# Patient Record
Sex: Female | Born: 1945 | Race: White | Hispanic: No | Marital: Married | Smoking: Former smoker
Health system: Southern US, Community
[De-identification: ages and names within clinical notes are randomized; demographics above are authoritative.]

## PROBLEM LIST (undated history)

## (undated) DIAGNOSIS — M0520 Rheumatoid vasculitis with rheumatoid arthritis of unspecified site: Secondary | ICD-10-CM

## (undated) DIAGNOSIS — S060XAA Concussion with loss of consciousness status unknown, initial encounter: Secondary | ICD-10-CM

## (undated) DIAGNOSIS — H269 Unspecified cataract: Secondary | ICD-10-CM

## (undated) DIAGNOSIS — E039 Hypothyroidism, unspecified: Secondary | ICD-10-CM

## (undated) DIAGNOSIS — T7840XA Allergy, unspecified, initial encounter: Secondary | ICD-10-CM

## (undated) DIAGNOSIS — F329 Major depressive disorder, single episode, unspecified: Secondary | ICD-10-CM

## (undated) DIAGNOSIS — M6530 Trigger finger, unspecified finger: Secondary | ICD-10-CM

## (undated) DIAGNOSIS — E559 Vitamin D deficiency, unspecified: Secondary | ICD-10-CM

## (undated) DIAGNOSIS — I10 Essential (primary) hypertension: Secondary | ICD-10-CM

## (undated) DIAGNOSIS — E785 Hyperlipidemia, unspecified: Secondary | ICD-10-CM

## (undated) DIAGNOSIS — M810 Age-related osteoporosis without current pathological fracture: Secondary | ICD-10-CM

## (undated) DIAGNOSIS — F32A Depression, unspecified: Secondary | ICD-10-CM

## (undated) DIAGNOSIS — M797 Fibromyalgia: Secondary | ICD-10-CM

## (undated) DIAGNOSIS — S060X9A Concussion with loss of consciousness of unspecified duration, initial encounter: Secondary | ICD-10-CM

## (undated) HISTORY — DX: Concussion with loss of consciousness of unspecified duration, initial encounter: S06.0X9A

## (undated) HISTORY — DX: Fibromyalgia: M79.7

## (undated) HISTORY — DX: Vitamin D deficiency, unspecified: E55.9

## (undated) HISTORY — DX: Hyperlipidemia, unspecified: E78.5

## (undated) HISTORY — DX: Allergy, unspecified, initial encounter: T78.40XA

## (undated) HISTORY — DX: Concussion with loss of consciousness status unknown, initial encounter: S06.0XAA

## (undated) HISTORY — PX: OTHER SURGICAL HISTORY: SHX169

## (undated) HISTORY — DX: Hypothyroidism, unspecified: E03.9

## (undated) HISTORY — DX: Rheumatoid vasculitis with rheumatoid arthritis of unspecified site: M05.20

## (undated) HISTORY — DX: Essential (primary) hypertension: I10

## (undated) HISTORY — PX: CATARACT EXTRACTION, BILATERAL: SHX1313

## (undated) HISTORY — DX: Major depressive disorder, single episode, unspecified: F32.9

## (undated) HISTORY — DX: Age-related osteoporosis without current pathological fracture: M81.0

## (undated) HISTORY — DX: Depression, unspecified: F32.A

## (undated) HISTORY — DX: Unspecified cataract: H26.9

## (undated) HISTORY — PX: CHOLECYSTECTOMY: SHX55

## (undated) HISTORY — PX: CYST REMOVAL TRUNK: SHX6283

## (undated) HISTORY — DX: Trigger finger, unspecified finger: M65.30

---

## 2004-05-06 ENCOUNTER — Ambulatory Visit: Payer: Self-pay | Admitting: Internal Medicine

## 2004-07-21 ENCOUNTER — Ambulatory Visit: Payer: Self-pay

## 2005-06-28 ENCOUNTER — Ambulatory Visit: Payer: Self-pay

## 2005-07-03 ENCOUNTER — Inpatient Hospital Stay: Payer: Self-pay | Admitting: General Surgery

## 2007-10-11 DIAGNOSIS — M6530 Trigger finger, unspecified finger: Secondary | ICD-10-CM | POA: Insufficient documentation

## 2013-11-13 LAB — HM PAP SMEAR: HM Pap smear: NORMAL

## 2013-11-23 LAB — HM DEXA SCAN: HM DEXA SCAN: ABNORMAL

## 2013-11-23 LAB — HM COLONOSCOPY: HM Colonoscopy: NORMAL

## 2014-02-16 LAB — LIPID PANEL
CHOLESTEROL: 218 mg/dL — AB (ref 0–200)
HDL: 35 mg/dL (ref 35–70)
LDL CALC: 140 mg/dL
TRIGLYCERIDES: 217 mg/dL — AB (ref 40–160)

## 2014-02-16 LAB — HEMOGLOBIN A1C: HEMOGLOBIN A1C: 5.3 % (ref 4.0–6.0)

## 2014-11-24 ENCOUNTER — Other Ambulatory Visit: Payer: Self-pay | Admitting: Family Medicine

## 2014-11-24 DIAGNOSIS — I10 Essential (primary) hypertension: Secondary | ICD-10-CM

## 2014-11-24 MED ORDER — HYDROCHLOROTHIAZIDE 12.5 MG PO TABS: 12.5000 mg | ORAL_TABLET | Freq: Every day | ORAL | Status: DC

## 2014-12-08 LAB — HM MAMMOGRAPHY: HM Mammogram: NEGATIVE

## 2014-12-09 ENCOUNTER — Encounter: Payer: Self-pay | Admitting: Family Medicine

## 2015-01-03 ENCOUNTER — Other Ambulatory Visit: Payer: Self-pay

## 2015-01-03 DIAGNOSIS — I10 Essential (primary) hypertension: Secondary | ICD-10-CM

## 2015-01-03 MED ORDER — METFORMIN HCL 500 MG PO TABS: 500.0000 mg | ORAL_TABLET | Freq: Two times a day (BID) | ORAL | Status: DC

## 2015-01-03 MED ORDER — HYDROCHLOROTHIAZIDE 12.5 MG PO TABS: 12.5000 mg | ORAL_TABLET | Freq: Every day | ORAL | Status: DC

## 2015-01-06 ENCOUNTER — Other Ambulatory Visit: Payer: Self-pay

## 2015-01-06 DIAGNOSIS — I10 Essential (primary) hypertension: Secondary | ICD-10-CM

## 2015-01-06 MED ORDER — HYDROCHLOROTHIAZIDE 12.5 MG PO TABS: 12.5000 mg | ORAL_TABLET | Freq: Every day | ORAL | Status: DC

## 2015-01-06 MED ORDER — METFORMIN HCL 500 MG PO TABS: 500.0000 mg | ORAL_TABLET | Freq: Two times a day (BID) | ORAL | Status: DC

## 2015-04-13 ENCOUNTER — Other Ambulatory Visit: Payer: Self-pay

## 2015-04-13 DIAGNOSIS — I10 Essential (primary) hypertension: Secondary | ICD-10-CM

## 2015-04-13 MED ORDER — HYDROCHLOROTHIAZIDE 12.5 MG PO TABS: 12.5000 mg | ORAL_TABLET | Freq: Every day | ORAL | Status: DC

## 2015-04-13 NOTE — Telephone Encounter (Signed)
Patient requesting refill. 

## 2015-05-09 ENCOUNTER — Telehealth: Payer: Self-pay | Admitting: Family Medicine

## 2015-05-09 NOTE — Telephone Encounter (Signed)
Duloxetine: patient is completely out of Duloxetine. She does the mail order however it will not be in until Thursday. She is requesting that you send in enough to last until then. Please send to her local pharmacy [facility]

## 2015-05-26 ENCOUNTER — Ambulatory Visit (INDEPENDENT_AMBULATORY_CARE_PROVIDER_SITE_OTHER): Payer: Medicare Other | Admitting: Family Medicine

## 2015-05-26 ENCOUNTER — Other Ambulatory Visit: Payer: Self-pay | Admitting: Family Medicine

## 2015-05-26 ENCOUNTER — Telehealth: Payer: Self-pay

## 2015-05-26 ENCOUNTER — Encounter: Payer: Self-pay | Admitting: Family Medicine

## 2015-05-26 VITALS — BP 148/90 | HR 100 | Temp 98.7°F | Resp 16 | Ht 60.0 in | Wt 145.4 lb

## 2015-05-26 DIAGNOSIS — Z85820 Personal history of malignant melanoma of skin: Secondary | ICD-10-CM | POA: Diagnosis not present

## 2015-05-26 DIAGNOSIS — E785 Hyperlipidemia, unspecified: Secondary | ICD-10-CM | POA: Insufficient documentation

## 2015-05-26 DIAGNOSIS — E119 Type 2 diabetes mellitus without complications: Secondary | ICD-10-CM | POA: Diagnosis not present

## 2015-05-26 DIAGNOSIS — E559 Vitamin D deficiency, unspecified: Secondary | ICD-10-CM | POA: Diagnosis not present

## 2015-05-26 DIAGNOSIS — H9190 Unspecified hearing loss, unspecified ear: Secondary | ICD-10-CM

## 2015-05-26 DIAGNOSIS — F325 Major depressive disorder, single episode, in full remission: Secondary | ICD-10-CM | POA: Insufficient documentation

## 2015-05-26 DIAGNOSIS — M05731 Rheumatoid arthritis with rheumatoid factor of right wrist without organ or systems involvement: Secondary | ICD-10-CM

## 2015-05-26 DIAGNOSIS — E038 Other specified hypothyroidism: Secondary | ICD-10-CM

## 2015-05-26 DIAGNOSIS — E039 Hypothyroidism, unspecified: Secondary | ICD-10-CM | POA: Insufficient documentation

## 2015-05-26 DIAGNOSIS — Z79899 Other long term (current) drug therapy: Secondary | ICD-10-CM

## 2015-05-26 DIAGNOSIS — M05732 Rheumatoid arthritis with rheumatoid factor of left wrist without organ or systems involvement: Secondary | ICD-10-CM

## 2015-05-26 DIAGNOSIS — Z23 Encounter for immunization: Secondary | ICD-10-CM | POA: Diagnosis not present

## 2015-05-26 DIAGNOSIS — M810 Age-related osteoporosis without current pathological fracture: Secondary | ICD-10-CM

## 2015-05-26 DIAGNOSIS — M069 Rheumatoid arthritis, unspecified: Secondary | ICD-10-CM | POA: Insufficient documentation

## 2015-05-26 DIAGNOSIS — F330 Major depressive disorder, recurrent, mild: Secondary | ICD-10-CM | POA: Diagnosis not present

## 2015-05-26 DIAGNOSIS — E1129 Type 2 diabetes mellitus with other diabetic kidney complication: Secondary | ICD-10-CM

## 2015-05-26 DIAGNOSIS — R809 Proteinuria, unspecified: Secondary | ICD-10-CM

## 2015-05-26 DIAGNOSIS — Z720 Tobacco use: Secondary | ICD-10-CM | POA: Insufficient documentation

## 2015-05-26 DIAGNOSIS — Z780 Asymptomatic menopausal state: Secondary | ICD-10-CM | POA: Insufficient documentation

## 2015-05-26 DIAGNOSIS — M797 Fibromyalgia: Secondary | ICD-10-CM | POA: Insufficient documentation

## 2015-05-26 DIAGNOSIS — I10 Essential (primary) hypertension: Secondary | ICD-10-CM

## 2015-05-26 LAB — POCT UA - MICROALBUMIN: MICROALBUMIN (UR) POC: 50 mg/L

## 2015-05-26 LAB — POCT GLYCOSYLATED HEMOGLOBIN (HGB A1C): Hemoglobin A1C: 5.4

## 2015-05-26 MED ORDER — DULOXETINE HCL 60 MG PO CPEP: 60.0000 mg | ORAL_CAPSULE | Freq: Every day | ORAL | Status: DC

## 2015-05-26 MED ORDER — MELOXICAM 15 MG PO TABS: 15.0000 mg | ORAL_TABLET | Freq: Every day | ORAL | Status: DC

## 2015-05-26 MED ORDER — LEVOTHYROXINE SODIUM 50 MCG PO TABS: 50.0000 ug | ORAL_TABLET | Freq: Every day | ORAL | Status: DC

## 2015-05-26 MED ORDER — HYDROCHLOROTHIAZIDE 12.5 MG PO TABS: 12.5000 mg | ORAL_TABLET | Freq: Every day | ORAL | Status: DC

## 2015-05-26 MED ORDER — METFORMIN HCL 500 MG PO TABS: 500.0000 mg | ORAL_TABLET | Freq: Every day | ORAL | Status: DC

## 2015-05-26 NOTE — Progress Notes (Signed)
Name: Renee Cowan   MRN: [id number]    DOB: 12-19-1945   Date:05/26/2015       Progress Note  Subjective  Chief Complaint  Chief Complaint  Patient presents with  . Follow-up    patient is here for her 55-month follow-up  . Medication Refill  . Hearing Problem    patient stated that she has been having some issues with her hearing. questions if it is due to her age.    HPI   DMII: diagnosed years ago, on Metformin 500 mg daily also follows a diet and is physically active.  She does not check fsbs at home. She denies polyphagia, or polyuria. She has polydipsia but states because her medication makes her mouth dry  Hearing Problem: she has noticed that she needs to use closed captions to watch TV. She would like to be tested today  Osteoporosis: she was diagnosed last year, not on medication, taking calcium otc only and would like to rechecked before going on medication  Hypertension: she is only taking HCTZ occasionally, a few times weekly . Denies chest pain or palpitation.   Hypothyroidism: she is on Synthroid 50 mcg daily, no side effects, takes it fasting with no other medication. Marland Kitchen Her weight is stable. Fatigue is intermittent  Major Depression: she has a long history  of depression, worse when husband passed away in an early age from Alzheimer's - died in September 30, 2010.  She states has intermittent of fatigue, anhedonia, crying spells. Taking medication daily .  Inflammatory arthritis: she continues to see Rheumatologist Dr. Trudie Reed in [place], on Methotrexate . Denies joint pain or swelling.    Patient Active Problem List   Diagnosis Date Noted  . Benign essential HTN 05/26/2015  . Depression, major, recurrent, mild ([place]) 05/26/2015  . Dyslipidemia 05/26/2015  . Diabetes type 2, controlled ([place]) 05/26/2015  . Fibromyalgia syndrome 05/26/2015  . Personal history of malignant melanoma of skin 05/26/2015  . Adult hypothyroidism 05/26/2015  . Osteoporosis, post-menopausal  05/26/2015  . Tobacco use 05/26/2015  . Vitamin D deficiency 05/26/2015  . Rheumatoid arthritis ([place]) 05/26/2015  . Acquired trigger finger 10/11/2007    Past Surgical History  Procedure Laterality Date  . Trigger thumb release    . Cyst removal trunk    . Cholecystectomy    . Melaoma excesion      Family History  Problem Relation Age of Onset  . Cancer Mother     cervical  . Tuberculosis Father     Social History   Social History  . Marital Status: Married    Spouse Name: N/A  . Number of Children: N/A  . Years of Education: N/A   Occupational History  . Not on file.   Social History Main Topics  . Smoking status: Current Every Day Smoker  . Smokeless tobacco: Not on file  . Alcohol Use: 0.0 oz/week    0 Standard drinks or equivalent per week  . Drug Use: No  . Sexual Activity: No   Other Topics Concern  . Not on file   Social History Narrative  . No narrative on file     Current outpatient prescriptions:  .  aspirin (ECOTRIN LOW STRENGTH) 81 MG EC tablet, ECOTRIN LOW STRENGTH, 81MG  (Oral Tablet Delayed Release)  1 po qday for 0 days  Quantity: 30.00;  Refills: 0   Ordered :17-Aug-2010  Steele Sizer MD;  Buddy Duty 20-Sep-2008 Active, Disp: , Rfl:  .  DULoxetine (CYMBALTA) 60 MG capsule, Take 1  capsule (60 mg total) by mouth daily., Disp: 90 capsule, Rfl: 1 .  folic acid (FOLVITE) 1 MG tablet, Take 1 mg by mouth daily., Disp: , Rfl: 1 .  hydrochlorothiazide (HYDRODIURIL) 12.5 MG tablet, Take 1 tablet (12.5 mg total) by mouth daily., Disp: 90 tablet, Rfl: 0 .  levothyroxine (SYNTHROID, LEVOTHROID) 50 MCG tablet, Take 1 tablet (50 mcg total) by mouth daily before breakfast., Disp: 90 tablet, Rfl: 1 .  meloxicam (MOBIC) 15 MG tablet, Take 1 tablet (15 mg total) by mouth daily., Disp: 90 tablet, Rfl: 1 .  metFORMIN (GLUCOPHAGE) 500 MG tablet, Take 1 tablet (500 mg total) by mouth daily with breakfast., Disp: 90 tablet, Rfl: 1 .  methotrexate (RHEUMATREX) 2.5 MG  tablet, , Disp: , Rfl: 0 .  Calcium Carb-Cholecalciferol (CALCIUM + D3) 600-200 MG-UNIT TABS, Take by mouth., Disp: , Rfl:   Allergies  Allergen Reactions  . Penicillins Rash  . Sulfa Antibiotics Rash     ROS  Constitutional: Negative for fever or weight change.  Respiratory: Negative for cough and shortness of breath.   Cardiovascular: Negative for chest pain or palpitations.  Gastrointestinal: Negative for abdominal pain, no bowel changes.  Musculoskeletal: Negative for gait problem no joint problems since started on Methotrexate   Skin: Negative for rash.  Neurological: Negative for dizziness or headache.  No other specific complaints in a complete review of systems (except as listed in HPI above).  Objective  Filed Vitals:   05/26/15 0805  BP: 148/90  Pulse: 100  Temp: 98.7 F (37.1 C)  TempSrc: Oral  Resp: 16  Height: 5' (1.524 m)  Weight: 145 lb 6.4 oz (65.953 kg)  SpO2: 97%    Body mass index is 28.4 kg/(m^2).  Physical Exam  Constitutional: Patient appears well-developed and well-nourished. No distress.  HEENT: head atraumatic, normocephalic, pupils equal and reactive to light, ears normal TM, neck supple, throat within normal limits Cardiovascular: Normal rate, regular rhythm and normal heart sounds.  No murmur heard. No BLE edema. Pulmonary/Chest: Effort normal and breath sounds normal. No respiratory distress. Abdominal: Soft.  There is no tenderness. Psychiatric: Patient has a normal mood and affect. behavior is normal. Judgment and thought content normal. Muscular Skeletal: decrease rom of both wrists.   PHQ2/9: Depression screen PHQ 2/9 05/26/2015  Decreased Interest 0  Down, Depressed, Hopeless 0  PHQ - 2 Score 0    Fall Risk: Fall Risk  05/26/2015  Falls in the past year? Yes  Number falls in past yr: 2 or more  Injury with Fall? Yes  Follow up Falls prevention discussed    Functional Status Survey: Is the patient deaf or have difficulty  hearing?: Yes Does the patient have difficulty seeing, even when wearing glasses/contacts?: No Does the patient have difficulty concentrating, remembering, or making decisions?: No Does the patient have difficulty walking or climbing stairs?: No Does the patient have difficulty dressing or bathing?: No Does the patient have difficulty doing errands alone such as visiting a doctor's office or shopping?: No   Hearing Screening   125Hz  250Hz  500Hz  1000Hz  2000Hz  4000Hz  8000Hz   Right ear:  Pass Pass  Pass Pass   Left ear:  Pass Pass  Pass Pass      Assessment & Plan  1. 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 Trudie Reed - meloxicam (MOBIC) 15 MG tablet; Take 1 tablet (15 mg total) by mouth daily.  Dispense: 90 tablet; Refill: 1  3. Dyslipidemia  - Lipid panel  4. Depression, major, recurrent, mild (HCC)  Stable on medication, no longer seeing a counselor - DULoxetine (CYMBALTA) 60 MG capsule; Take 1 capsule (60 mg total) by mouth daily.  Dispense: 90 capsule; Refill: 1  5. Vitamin D deficiency  - VITAMIN D 25 Hydroxy (Vit-D Deficiency, Fractures)  6. Personal history of malignant melanoma of skin   advised to go back to Dr. Doristine Johns, she will call her own appointment  7. Other specified hypothyroidism  - TSH - levothyroxine (SYNTHROID, LEVOTHROID) 50 MCG tablet; Take 1 tablet (50 mcg total) by mouth daily before breakfast.  Dispense: 90 tablet; Refill: 1  8. Benign essential HTN  She needs to take medication daily , not a few times weekly, to get bp at  goal - Comprehensive metabolic panel - hydrochlorothiazide (HYDRODIURIL) 12.5 MG tablet; Take 1 tablet (12.5 mg total) by mouth daily.  Dispense: 90 tablet; Refill: 0  9. Hearing problem, unspecified laterality  Normal hearing test, advised evaluation with ENT if symptoms gets worse  10. Long-term use of high-risk medication  - Comprehensive metabolic panel

## 2015-05-26 NOTE — Telephone Encounter (Signed)
Per the request of Dr. Ancil Boozer, I tried to contact this patient to inform her of the lab results (proteinuria) from her Urine Micro and to let her know that Dr. Ancil Boozer would like to switch her from just the HCTZ to a combination medication that will address that issue as well as prevent any future damage to her organs, but there was no answer. I left a message for her to give [place] a call when she got the chance.

## 2015-05-27 ENCOUNTER — Encounter: Payer: Self-pay | Admitting: Family Medicine

## 2015-05-28 LAB — COMPREHENSIVE METABOLIC PANEL
A/G RATIO: 2 (ref 1.1–2.5)
ALK PHOS: 81 IU/L (ref 39–117)
ALT: 15 IU/L (ref 0–32)
AST: 15 IU/L (ref 0–40)
Albumin: 4.3 g/dL (ref 3.6–4.8)
BILIRUBIN TOTAL: 0.4 mg/dL (ref 0.0–1.2)
BUN/Creatinine Ratio: 20 (ref 11–26)
BUN: 12 mg/dL (ref 8–27)
CO2: 25 mmol/L (ref 18–29)
Calcium: 9.2 mg/dL (ref 8.7–10.3)
Chloride: 99 mmol/L (ref 97–106)
Creatinine, Ser: 0.6 mg/dL (ref 0.57–1.00)
GFR calc Af Amer: 110 mL/min/{1.73_m2} (ref >59)
GFR calc non Af Amer: 95 mL/min/{1.73_m2} (ref >59)
GLOBULIN, TOTAL: 2.2 g/dL (ref 1.5–4.5)
Glucose: 100 mg/dL — ABNORMAL HIGH (ref 65–99)
POTASSIUM: 4.7 mmol/L (ref 3.5–5.2)
SODIUM: 139 mmol/L (ref 136–144)
Total Protein: 6.5 g/dL (ref 6.0–8.5)

## 2015-05-28 LAB — TSH: TSH: 0.847 u[IU]/mL (ref 0.450–4.500)

## 2015-05-28 LAB — LIPID PANEL
CHOL/HDL RATIO: 6.2 ratio — AB (ref 0.0–4.4)
Cholesterol, Total: 204 mg/dL — ABNORMAL HIGH (ref 100–199)
HDL: 33 mg/dL — ABNORMAL LOW (ref >39)
LDL CALC: 131 mg/dL — AB (ref 0–99)
TRIGLYCERIDES: 198 mg/dL — AB (ref 0–149)
VLDL Cholesterol Cal: 40 mg/dL (ref 5–40)

## 2015-05-28 LAB — VITAMIN D 25 HYDROXY (VIT D DEFICIENCY, FRACTURES): Vit D, 25-Hydroxy: 34.1 ng/mL (ref 30.0–100.0)

## 2015-05-29 ENCOUNTER — Other Ambulatory Visit: Payer: Self-pay | Admitting: Family Medicine

## 2015-05-29 MED ORDER — ATORVASTATIN CALCIUM 40 MG PO TABS: 40.0000 mg | ORAL_TABLET | Freq: Every day | ORAL | Status: DC

## 2015-06-01 ENCOUNTER — Encounter: Payer: Self-pay | Admitting: Family Medicine

## 2015-07-12 ENCOUNTER — Other Ambulatory Visit: Payer: Self-pay

## 2015-07-12 DIAGNOSIS — I10 Essential (primary) hypertension: Secondary | ICD-10-CM

## 2015-07-12 MED ORDER — HYDROCHLOROTHIAZIDE 12.5 MG PO TABS: 12.5000 mg | ORAL_TABLET | Freq: Every day | ORAL | Status: DC

## 2015-07-12 NOTE — Telephone Encounter (Signed)
Patient requesting refill. 

## 2015-10-10 ENCOUNTER — Other Ambulatory Visit: Payer: Self-pay | Admitting: Unknown Physician Specialty

## 2015-10-10 DIAGNOSIS — H9319 Tinnitus, unspecified ear: Secondary | ICD-10-CM

## 2015-10-10 DIAGNOSIS — R42 Dizziness and giddiness: Secondary | ICD-10-CM

## 2015-10-15 ENCOUNTER — Other Ambulatory Visit: Payer: Self-pay | Admitting: Family Medicine

## 2015-10-27 ENCOUNTER — Other Ambulatory Visit: Payer: Self-pay | Admitting: Family Medicine

## 2015-10-27 ENCOUNTER — Telehealth: Payer: Self-pay | Admitting: Family Medicine

## 2015-10-27 DIAGNOSIS — E785 Hyperlipidemia, unspecified: Secondary | ICD-10-CM

## 2015-10-27 DIAGNOSIS — I10 Essential (primary) hypertension: Secondary | ICD-10-CM

## 2015-10-27 DIAGNOSIS — E038 Other specified hypothyroidism: Secondary | ICD-10-CM

## 2015-10-27 DIAGNOSIS — E1121 Type 2 diabetes mellitus with diabetic nephropathy: Secondary | ICD-10-CM

## 2015-10-27 NOTE — Progress Notes (Signed)
done

## 2015-10-27 NOTE — Telephone Encounter (Signed)
Patient has appointment for 11-28-15 and would like to know if you would like to order lab work before appointment so the results will be in when she comes in

## 2015-10-28 ENCOUNTER — Ambulatory Visit
Admission: RE | Admit: 2015-10-28 | Discharge: 2015-10-28 | Disposition: A | Discharge status: Home / Self Care | Payer: Medicare Other | Source: Ambulatory Visit | Attending: Unknown Physician Specialty | Admitting: Unknown Physician Specialty

## 2015-10-28 DIAGNOSIS — I6782 Cerebral ischemia: Secondary | ICD-10-CM | POA: Diagnosis not present

## 2015-10-28 DIAGNOSIS — R42 Dizziness and giddiness: Secondary | ICD-10-CM | POA: Diagnosis not present

## 2015-10-28 DIAGNOSIS — H9319 Tinnitus, unspecified ear: Secondary | ICD-10-CM

## 2015-10-28 LAB — POCT I-STAT CREATININE: CREATININE: 0.7 mg/dL (ref 0.44–1.00)

## 2015-10-28 MED ORDER — GADOBENATE DIMEGLUMINE 529 MG/ML IV SOLN
18.0000 mL | Freq: Once | INTRAVENOUS | Status: AC | PRN
  Administered 2015-10-28: 18 mL via INTRAVENOUS

## 2015-11-20 ENCOUNTER — Encounter: Payer: Self-pay | Admitting: Family Medicine

## 2015-11-22 ENCOUNTER — Other Ambulatory Visit: Payer: Self-pay | Admitting: Family Medicine

## 2015-11-22 DIAGNOSIS — I10 Essential (primary) hypertension: Secondary | ICD-10-CM

## 2015-11-22 DIAGNOSIS — E559 Vitamin D deficiency, unspecified: Secondary | ICD-10-CM

## 2015-11-22 DIAGNOSIS — E785 Hyperlipidemia, unspecified: Secondary | ICD-10-CM

## 2015-11-22 DIAGNOSIS — E1121 Type 2 diabetes mellitus with diabetic nephropathy: Secondary | ICD-10-CM

## 2015-11-22 DIAGNOSIS — E038 Other specified hypothyroidism: Secondary | ICD-10-CM

## 2015-11-24 ENCOUNTER — Other Ambulatory Visit: Payer: Self-pay | Admitting: Family Medicine

## 2015-11-25 ENCOUNTER — Encounter: Payer: Self-pay | Admitting: Family Medicine

## 2015-11-25 LAB — HEMOGLOBIN A1C
Est. average glucose Bld gHb Est-mCnc: 117 mg/dL
Hgb A1c MFr Bld: 5.7 % — ABNORMAL HIGH (ref 4.8–5.6)

## 2015-11-25 LAB — COMPREHENSIVE METABOLIC PANEL
ALK PHOS: 75 IU/L (ref 39–117)
ALT: 14 IU/L (ref 0–32)
AST: 11 IU/L (ref 0–40)
Albumin/Globulin Ratio: 1.8 (ref 1.2–2.2)
Albumin: 4.3 g/dL (ref 3.6–4.8)
BUN/Creatinine Ratio: 22 (ref 12–28)
BUN: 15 mg/dL (ref 8–27)
Bilirubin Total: 0.6 mg/dL (ref 0.0–1.2)
CALCIUM: 9.3 mg/dL (ref 8.7–10.3)
CO2: 26 mmol/L (ref 18–29)
CREATININE: 0.69 mg/dL (ref 0.57–1.00)
Chloride: 96 mmol/L (ref 96–106)
GFR calc Af Amer: 104 mL/min/{1.73_m2} (ref >59)
GFR, EST NON AFRICAN AMERICAN: 90 mL/min/{1.73_m2} (ref >59)
GLUCOSE: 103 mg/dL — AB (ref 65–99)
Globulin, Total: 2.4 g/dL (ref 1.5–4.5)
Potassium: 4 mmol/L (ref 3.5–5.2)
SODIUM: 136 mmol/L (ref 134–144)
Total Protein: 6.7 g/dL (ref 6.0–8.5)

## 2015-11-25 LAB — LIPID PANEL
CHOL/HDL RATIO: 6.2 ratio — AB (ref 0.0–4.4)
CHOLESTEROL TOTAL: 205 mg/dL — AB (ref 100–199)
HDL: 33 mg/dL — AB (ref >39)
LDL CALC: 132 mg/dL — AB (ref 0–99)
TRIGLYCERIDES: 202 mg/dL — AB (ref 0–149)
VLDL Cholesterol Cal: 40 mg/dL (ref 5–40)

## 2015-11-25 LAB — TSH: TSH: 1.08 u[IU]/mL (ref 0.450–4.500)

## 2015-11-25 LAB — VITAMIN D 25 HYDROXY (VIT D DEFICIENCY, FRACTURES): Vit D, 25-Hydroxy: 29.5 ng/mL — ABNORMAL LOW (ref 30.0–100.0)

## 2015-11-28 ENCOUNTER — Encounter: Payer: Self-pay | Admitting: Family Medicine

## 2015-11-28 ENCOUNTER — Ambulatory Visit (INDEPENDENT_AMBULATORY_CARE_PROVIDER_SITE_OTHER): Payer: Medicare Other | Admitting: Family Medicine

## 2015-11-28 VITALS — BP 118/76 | HR 79 | Temp 98.7°F | Resp 16 | Ht 60.0 in | Wt 129.7 lb

## 2015-11-28 DIAGNOSIS — F325 Major depressive disorder, single episode, in full remission: Secondary | ICD-10-CM

## 2015-11-28 DIAGNOSIS — I10 Essential (primary) hypertension: Secondary | ICD-10-CM | POA: Diagnosis not present

## 2015-11-28 DIAGNOSIS — M797 Fibromyalgia: Secondary | ICD-10-CM

## 2015-11-28 DIAGNOSIS — R42 Dizziness and giddiness: Secondary | ICD-10-CM | POA: Diagnosis not present

## 2015-11-28 DIAGNOSIS — Z720 Tobacco use: Secondary | ICD-10-CM | POA: Diagnosis not present

## 2015-11-28 DIAGNOSIS — Z23 Encounter for immunization: Secondary | ICD-10-CM

## 2015-11-28 DIAGNOSIS — E785 Hyperlipidemia, unspecified: Secondary | ICD-10-CM | POA: Diagnosis not present

## 2015-11-28 DIAGNOSIS — S80812A Abrasion, left lower leg, initial encounter: Secondary | ICD-10-CM

## 2015-11-28 DIAGNOSIS — R809 Proteinuria, unspecified: Secondary | ICD-10-CM

## 2015-11-28 DIAGNOSIS — E1129 Type 2 diabetes mellitus with other diabetic kidney complication: Secondary | ICD-10-CM | POA: Diagnosis not present

## 2015-11-28 MED ORDER — EZETIMIBE 10 MG PO TABS: 10.0000 mg | ORAL_TABLET | Freq: Every day | ORAL | Status: DC

## 2015-11-28 NOTE — Patient Instructions (Signed)
STOP HCTZ ( BP MEDICATION ) ONE WEEK PRIOR TO YOUR NEXT APPOINTMENT

## 2015-11-28 NOTE — Progress Notes (Signed)
Name: Renee Cowan   MRN: [id number]    DOB: May 28, 1946   Date:11/28/2015       Progress Note  Subjective  Chief Complaint  Chief Complaint  Patient presents with  . Diabetes    patient stated she does not check her blood sugar regularly.  . Medication Management  . Medication Refill    patient needs a short supply of levothyroxine until mail order comes in  . Advice Only    Review of labs and MRI results    HPI  Dizziness: symptoms of being dizzy, no true vertigo started months ago, she saw Dr. Tami Ribas about 2 months ago, had hearing test and audiology evaluation, normal MRI except for normal aging.   DMII: diagnosed years ago, on Metformin 500 mg daily also follows a diet and is physically active, she is seeing a dietician and has lost 16 lbs since last visit, she would like to stop taking Metformin and stay on diet and exercise until next visit. She does not check fsbs at home. She denies polyphagia, or polyuria. Urine micro today is back to normal, she is only on HCTZ, never got the lisinopril level.  Hypertension: she has been taking HCTZ, bp is great today, she lost weight, she will stop medication 1 week before her next visit.  Denies chest pain or palpitation.   Hypothyroidism: she is on Synthroid 50 mcg daily, no side effects, takes it fasting with no other medication. She lost weight since last visit, by change in diet.  Fatigue is intermittent  Major Depression: she has a long history of depression, worse when husband passed away in an early age from Alzheimer's - died in 2010/10/11. She stopped taking Duloxetine about 3 months ago, and has been feeling better, no lack of motivation, states has emotions now and is not feeling numb. She feels like she is back to her normal self.   Tobacco use: she is going to try quitting smoking.   Dyslipidemia: based  on the results of lipid panel his cardiovascular risk factor ( using [facility] )  in the next 10 years is : 26% and she is  willing to try Zetia, does not like side effects of statin therapy  FMS/RA: seeing Rheumatologist, still taking Methotrexate. She states no longer has muscles aches, joints is feeling well, and is getting medication weaned off.      Patient Active Problem List   Diagnosis Date Noted  . Type 2 diabetes mellitus with microalbuminuria, without long-term current use of insulin ([place]) 11/28/2015  . Benign essential HTN 05/26/2015  . Depression, major, in remission ([place]) 05/26/2015  . Dyslipidemia 05/26/2015  . Type 2 diabetes mellitus with renal manifestations, controlled ([place]) 05/26/2015  . Fibromyalgia syndrome 05/26/2015  . Personal history of malignant melanoma of skin 05/26/2015  . Adult hypothyroidism 05/26/2015  . Osteoporosis, post-menopausal 05/26/2015  . Tobacco use 05/26/2015  . Vitamin D deficiency 05/26/2015  . Rheumatoid arthritis ([place]) 05/26/2015  . Acquired trigger finger 10/11/2007    Past Surgical History  Procedure Laterality Date  . Trigger thumb release    . Cyst removal trunk    . Cholecystectomy    . Melaoma excesion      Family History  Problem Relation Age of Onset  . Cancer Mother     cervical  . Tuberculosis Father     Social History   Social History  . Marital Status: Married    Spouse Name: N/A  . Number of Children: N/A  .  Years of Education: N/A   Occupational History  . Not on file.   Social History Main Topics  . Smoking status: Current Every Day Smoker -- 0.50 packs/day    Types: Cigarettes  . Smokeless tobacco: Not on file  . Alcohol Use: 0.0 oz/week    0 Standard drinks or equivalent per week  . Drug Use: No  . Sexual Activity: No   Other Topics Concern  . Not on file   Social History Narrative     Current outpatient prescriptions:  .  aspirin (ECOTRIN LOW STRENGTH) 81 MG EC tablet, ECOTRIN LOW STRENGTH, 81MG  (Oral Tablet Delayed Release)  1 po qday for 0 days  Quantity: 30.00;  Refills: 0   Ordered :17-Aug-2010  Steele Sizer MD;  Buddy Duty 20-Sep-2008 Active, Disp: , Rfl:  .  Calcium Carb-Cholecalciferol (CALCIUM + D3) 600-200 MG-UNIT TABS, Take by mouth., Disp: , Rfl:  .  folic acid (FOLVITE) 1 MG tablet, Take 1 mg by mouth daily., Disp: , Rfl: 1 .  hydrochlorothiazide (HYDRODIURIL) 12.5 MG tablet, Take 1 tablet (12.5 mg total) by mouth daily., Disp: 90 tablet, Rfl: 1 .  levothyroxine (SYNTHROID, LEVOTHROID) 50 MCG tablet, Take 1 tablet by mouth  daily before breakfast, Disp: 90 tablet, Rfl: 1 .  methotrexate (RHEUMATREX) 2.5 MG tablet, , Disp: , Rfl: 0 .  ezetimibe (ZETIA) 10 MG tablet, Take 1 tablet (10 mg total) by mouth daily., Disp: 90 tablet, Rfl: 1 .  meloxicam (MOBIC) 15 MG tablet, Take 1 tablet (15 mg total) by mouth daily. (Patient not taking: Reported on 11/28/2015), Disp: 90 tablet, Rfl: 1  Allergies  Allergen Reactions  . Penicillins Rash  . Sulfa Antibiotics Rash     ROS  Constitutional: Negative for fever , positive for weight change.  Respiratory: Negative for cough and shortness of breath.   Cardiovascular: Negative for chest pain or palpitations.  Gastrointestinal: Negative for abdominal pain, no bowel changes.  Musculoskeletal: Negative for gait problem or joint swelling.  Skin: Negative for rash.  Neurological: Positive  for intermittent dizziness , no headache.  No other specific complaints in a complete review of systems (except as listed in HPI above).  Objective  Filed Vitals:   11/28/15 0813  BP: 118/76  Pulse: 79  Temp: 98.7 F (37.1 C)  TempSrc: Oral  Resp: 16  Height: 5' (1.524 m)  Weight: 129 lb 11.2 oz (58.832 kg)  SpO2: 97%    Body mass index is 25.33 kg/(m^2).  Physical Exam  Constitutional: Patient appears well-developed and well-nourished.  No distress.  HEENT: head atraumatic, normocephalic, pupils equal and reactive to light, ears normal TM bilaterally , neck supple, throat within normal limits Cardiovascular: Normal rate, regular rhythm and normal  heart sounds.  No murmur heard. No BLE edema. Pulmonary/Chest: Effort normal and breath sounds normal. No respiratory distress. Abdominal: Soft.  There is no tenderness. Psychiatric: Patient has a normal mood and affect. behavior is normal. Judgment and thought content normal. Skin: excoriation on left lateral leg - small - from working on her yard this past weekend - due for Tdap  Recent Results (from the past 2160 hour(s))  I-STAT creatinine     Status: None   Collection Time: 10/28/15  8:56 AM  Result Value Ref Range   Creatinine, Ser 0.70 0.44 - 1.00 mg/dL  Comprehensive metabolic panel     Status: Abnormal   Collection Time: 11/24/15  9:36 AM  Result Value Ref Range   Glucose 103 (H) 65 -  99 mg/dL   BUN 15 8 - 27 mg/dL   Creatinine, Ser 0.69 0.57 - 1.00 mg/dL   GFR calc non Af Amer 90 >59 mL/min/1.73   GFR calc Af Amer 104 >59 mL/min/1.73   BUN/Creatinine Ratio 22 12 - 28   Sodium 136 134 - 144 mmol/L   Potassium 4.0 3.5 - 5.2 mmol/L   Chloride 96 96 - 106 mmol/L   CO2 26 18 - 29 mmol/L   Calcium 9.3 8.7 - 10.3 mg/dL   Total Protein 6.7 6.0 - 8.5 g/dL   Albumin 4.3 3.6 - 4.8 g/dL   Globulin, Total 2.4 1.5 - 4.5 g/dL   Albumin/Globulin Ratio 1.8 1.2 - 2.2   Bilirubin Total 0.6 0.0 - 1.2 mg/dL   Alkaline Phosphatase 75 39 - 117 IU/L   AST 11 0 - 40 IU/L   ALT 14 0 - 32 IU/L  Lipid panel     Status: Abnormal   Collection Time: 11/24/15  9:36 AM  Result Value Ref Range   Cholesterol, Total 205 (H) 100 - 199 mg/dL   Triglycerides 202 (H) 0 - 149 mg/dL   HDL 33 (L) >39 mg/dL   VLDL Cholesterol Cal 40 5 - 40 mg/dL   LDL Calculated 132 (H) 0 - 99 mg/dL   Chol/HDL Ratio 6.2 (H) 0.0 - 4.4 ratio units    Comment:                                   T. Chol/HDL Ratio                                             Men  Women                               1/2 Avg.Risk  3.4    3.3                                   Avg.Risk  5.0    4.4                                2X Avg.Risk  9.6     7.1                                3X Avg.Risk 23.4   11.0   Hemoglobin A1c     Status: Abnormal   Collection Time: 11/24/15  9:36 AM  Result Value Ref Range   Hgb A1c MFr Bld 5.7 (H) 4.8 - 5.6 %    Comment:          Pre-diabetes: 5.7 - 6.4          Diabetes: >6.4          Glycemic control for adults with diabetes: <7.0    Est. average glucose Bld gHb Est-mCnc 117 mg/dL  TSH     Status: None   Collection Time: 11/24/15  9:36 AM  Result Value Ref Range   TSH 1.080 0.450 - 4.500 uIU/mL  VITAMIN D 25  Hydroxy (Vit-D Deficiency, Fractures)     Status: Abnormal   Collection Time: 11/24/15  9:36 AM  Result Value Ref Range   Vit D, 25-Hydroxy 29.5 (L) 30.0 - 100.0 ng/mL    Comment: Vitamin D deficiency has been defined by the [place] practice guideline as a level of serum 25-OH vitamin D less than 20 ng/mL (1,2). The Endocrine Society went on to further define vitamin D insufficiency as a level between 21 and 29 ng/mL (2). 1. IOM (Institute of Medicine). 2010. Dietary reference    intakes for calcium and D. [employer]: The    [employer]. 2. Holick MF, B[place], Bischoff-Ferrari HA, et al.    Evaluation, treatment, and prevention of vitamin D    deficiency: an Endocrine Society clinical practice    guideline. JCEM. 2011 Jul; 96(7):1911-30.      PHQ2/9: Depression screen [facility] 2/9 11/28/2015 05/26/2015  Decreased Interest 0 0  Down, Depressed, Hopeless 0 0  PHQ - 2 Score 0 0     Fall Risk: Fall Risk  11/28/2015 05/26/2015  Falls in the past year? Yes Yes  Number falls in past yr: 1 2 or more  Injury with Fall? No Yes  Follow up - Falls prevention discussed     Functional Status Survey: Is the patient deaf or have difficulty hearing?: Yes (patient stated that she has some slight hearing loss in the left ear.) Does the patient have difficulty seeing, even when wearing glasses/contacts?: No Does the patient have difficulty  concentrating, remembering, or making decisions?: No Does the patient have difficulty walking or climbing stairs?: No Does the patient have difficulty dressing or bathing?: No Does the patient have difficulty doing errands alone such as visiting a doctor's office or shopping?: No    Assessment & Plan  1. Type 2 diabetes mellitus with microalbuminuria, without long-term current use of insulin (HCC)  Microalbuminuria resolved, never started on ACE, she will try to come off Metformin, continue diet  2. Fibromyalgia syndrome  Doing well  3. Dyslipidemia  - ezetimibe (ZETIA) 10 MG tablet; Take 1 tablet (10 mg total) by mouth daily.  Dispense: 90 tablet; Refill: 1  4. Depression, major, in remission ([place])  Doing well at this time  5. Benign essential HTN  She will try to wean self off HCTZ before her next visit, one week prior  6. Tobacco use  Discussed ways to quit smoking  7. Need for pneumococcal vaccination  - Pneumococcal polysaccharide vaccine 23-valent greater than or equal to 2yo subcutaneous/IM  8. Dizziness  Doing better, had vestibular exercises prescribed by ENT   9. Excoriation of lower leg, left, initial encounter  - Tdap vaccine greater than or equal to 7yo IM

## 2015-12-28 ENCOUNTER — Encounter: Payer: Self-pay | Admitting: Family Medicine

## 2015-12-30 ENCOUNTER — Other Ambulatory Visit: Payer: Self-pay | Admitting: Family Medicine

## 2015-12-30 MED ORDER — MAGNESIUM 400 MG PO CAPS: 1.0000 | ORAL_CAPSULE | Freq: Every day | ORAL | Status: DC

## 2015-12-30 MED ORDER — METFORMIN HCL 500 MG PO TABS: 500.0000 mg | ORAL_TABLET | Freq: Every day | ORAL | Status: DC

## 2016-01-03 ENCOUNTER — Other Ambulatory Visit: Payer: Self-pay

## 2016-01-03 DIAGNOSIS — I10 Essential (primary) hypertension: Secondary | ICD-10-CM

## 2016-01-03 NOTE — Telephone Encounter (Signed)
Patient requesting refill. 

## 2016-01-04 MED ORDER — HYDROCHLOROTHIAZIDE 12.5 MG PO TABS: 12.5000 mg | ORAL_TABLET | Freq: Every day | ORAL | Status: DC

## 2016-01-17 ENCOUNTER — Ambulatory Visit (INDEPENDENT_AMBULATORY_CARE_PROVIDER_SITE_OTHER): Payer: Medicare Other | Admitting: Family Medicine

## 2016-01-17 ENCOUNTER — Encounter: Payer: Self-pay | Admitting: Family Medicine

## 2016-01-17 VITALS — BP 120/68 | HR 81 | Temp 99.0°F | Resp 18 | Ht 60.0 in | Wt 128.2 lb

## 2016-01-17 DIAGNOSIS — Z0000 Encounter for general adult medical examination without abnormal findings: Secondary | ICD-10-CM

## 2016-01-17 NOTE — Progress Notes (Signed)
Name: Renee Cowan   MRN: [id number]    DOB: 1945-12-20   Date:01/17/2016       Progress Note  Subjective  Chief Complaint  Chief Complaint  Patient presents with  . Annual Exam    annual wellness visit    HPI   Functional ability/safety issues: No Issues Hearing issues: Addressed  Activities of daily living: Discussed Home safety issues: No Issues  End Of Life Planning: Offered verbal information regarding advanced directives, healthcare power of attorney.  Preventative care, Health maintenance, Preventative health measures discussed.  Preventative screenings discussed today: lab work, colonoscopy,  mammogram, DEXA.  Low Dose CT Chest recommended if Age 46-80 years, 30 pack-year currently smoking OR have quit w/in 15years.   Lifestyle risk factor issued reviewed: Diet, exercise, weight management, advised patient smoking is not healthy, nutrition/diet.  Preventative health measures discussed (5-10 year plan).  Reviewed and recommended vaccinations: - Pneumovax  - Prevnar  - Annual Influenza - Zostavax - Tdap   Depression screening: Done Fall risk screening: Done Discuss ADLs/IADLs: Done  Current medical providers: See HPI  Other health risk factors identified this visit: No other issues Cognitive impairment issues: None identified  All above discussed with patient. Appropriate education, counseling and referral will be made based upon the above.   Dyslipidemia: she is taking Zetia and denies side effects, we will recheck labs on her next visit   HTN: she has been off BP medication for the past week and bp is at goal, may stay off HCTZ.     Patient Active Problem List   Diagnosis Date Noted  . Type 2 diabetes mellitus with microalbuminuria, without long-term current use of insulin ([place]) 11/28/2015  . Benign essential HTN 05/26/2015  . Depression, major, in remission ([place]) 05/26/2015  . Dyslipidemia 05/26/2015  . Type 2 diabetes mellitus with renal  manifestations, controlled ([place]) 05/26/2015  . Fibromyalgia syndrome 05/26/2015  . Personal history of malignant melanoma of skin 05/26/2015  . Adult hypothyroidism 05/26/2015  . Osteoporosis, post-menopausal 05/26/2015  . Tobacco use 05/26/2015  . Vitamin D deficiency 05/26/2015  . Rheumatoid arthritis ([place]) 05/26/2015  . Acquired trigger finger 10/11/2007    Past Surgical History:  Procedure Laterality Date  . CATARACT EXTRACTION, BILATERAL Bilateral [id number] and [id number]  . CHOLECYSTECTOMY    . CYST REMOVAL TRUNK    . melaoma excesion    . trigger thumb release      Family History  Problem Relation Age of Onset  . Cancer Mother     cervical  . Tuberculosis Father     Social History   Social History  . Marital status: Married    Spouse name: N/A  . Number of children: N/A  . Years of education: N/A   Occupational History  . Not on file.   Social History Main Topics  . Smoking status: Current Every Day Smoker    Packs/day: 0.50    Types: Cigarettes  . Smokeless tobacco: Never Used  . Alcohol use 0.0 oz/week  . Drug use: No  . Sexual activity: No   Other Topics Concern  . Not on file   Social History Narrative  . No narrative on file     Current Outpatient Prescriptions:  .  aspirin (ECOTRIN LOW STRENGTH) 81 MG EC tablet, ECOTRIN LOW STRENGTH, 81MG  (Oral Tablet Delayed Release)  1 po qday for 0 days  Quantity: 30.00;  Refills: 0   Ordered :17-Aug-2010  Steele Sizer MD;  Buddy Duty 20-Sep-2008 Active, Disp: ,  Rfl:  .  Calcium Carb-Cholecalciferol (CALCIUM + D3) 600-200 MG-UNIT TABS, Take by mouth., Disp: , Rfl:  .  ezetimibe (ZETIA) 10 MG tablet, Take 1 tablet (10 mg total) by mouth daily., Disp: 90 tablet, Rfl: 1 .  folic acid (FOLVITE) 1 MG tablet, Take 1 mg by mouth daily., Disp: , Rfl: 1 .  levothyroxine (SYNTHROID, LEVOTHROID) 50 MCG tablet, Take 1 tablet by mouth  daily before breakfast, Disp: 90 tablet, Rfl: 1 .  Magnesium 400 MG CAPS, Take 1  capsule by mouth daily., Disp: 30 capsule, Rfl: 0 .  meloxicam (MOBIC) 15 MG tablet, Take 1 tablet (15 mg total) by mouth daily. (Patient not taking: Reported on 11/28/2015), Disp: 90 tablet, Rfl: 1 .  metFORMIN (GLUCOPHAGE) 500 MG tablet, Take 1 tablet (500 mg total) by mouth daily with breakfast., Disp: 90 tablet, Rfl: 1 .  methotrexate (RHEUMATREX) 2.5 MG tablet, , Disp: , Rfl: 0  Allergies  Allergen Reactions  . Penicillins Rash  . Sulfa Antibiotics Rash     ROS  Constitutional: Negative for fever or significant  weight change.  Respiratory: Negative for cough and shortness of breath.   Cardiovascular: Negative for chest pain or palpitations.  Gastrointestinal: Negative for abdominal pain, no bowel changes.  Musculoskeletal: Negative for gait problem or joint swelling.  Skin: Negative for rash.  Neurological: Negative for dizziness or headache.  No other specific complaints in a complete review of systems (except as listed in HPI above).  Objective  Vitals:   01/17/16 1357  BP: 120/68  Pulse: 81  Resp: 18  Temp: 99 F (37.2 C)  SpO2: 96%  Weight: 128 lb 3 oz (58.1 kg)  Height: 5' (1.524 m)    Body mass index is 25.03 kg/m.  Physical Exam  Constitutional: Patient appears well-developed and well-nourished. No distress.  HENT: Head: Normocephalic and atraumatic. Ears: B TMs ok, no erythema or effusion; Nose: Nose normal. Mouth/Throat: Oropharynx is clear and moist. No oropharyngeal exudate.  Eyes: Conjunctivae and EOM are normal. Pupils are equal, round, and reactive to light. No scleral icterus.  Neck: Normal range of motion. Neck supple. No JVD present. No thyromegaly present.  Cardiovascular: Normal rate, regular rhythm and normal heart sounds.  No murmur heard. No BLE edema. Pulmonary/Chest: Effort normal and breath sounds normal. No respiratory distress. Abdominal: Soft. Bowel sounds are normal, no distension. There is no tenderness. no masses Breast: no lumps or  masses, no nipple discharge or rashes FEMALE GENITALIA:  Not done RECTAL: not done Musculoskeletal: Normal range of motion, no joint effusions. No gross deformities Neurological: he is alert and oriented to person, place, and time. No cranial nerve deficit. Coordination, balance, strength, speech and gait are normal.  Skin: Skin is warm and dry. No rash noted. No erythema.  Psychiatric: Patient has a normal mood and affect. behavior is normal. Judgment and thought content normal.  Recent Results (from the past 2160 hour(s))  I-STAT creatinine     Status: None   Collection Time: 10/28/15  8:56 AM  Result Value Ref Range   Creatinine, Ser 0.70 0.44 - 1.00 mg/dL  Comprehensive metabolic panel     Status: Abnormal   Collection Time: 11/24/15  9:36 AM  Result Value Ref Range   Glucose 103 (H) 65 - 99 mg/dL   BUN 15 8 - 27 mg/dL   Creatinine, Ser 0.69 0.57 - 1.00 mg/dL   GFR calc non Af Amer 90 >59 mL/min/1.73   GFR calc Af Amer 104 >  59 mL/min/1.73   BUN/Creatinine Ratio 22 12 - 28   Sodium 136 134 - 144 mmol/L   Potassium 4.0 3.5 - 5.2 mmol/L   Chloride 96 96 - 106 mmol/L   CO2 26 18 - 29 mmol/L   Calcium 9.3 8.7 - 10.3 mg/dL   Total Protein 6.7 6.0 - 8.5 g/dL   Albumin 4.3 3.6 - 4.8 g/dL   Globulin, Total 2.4 1.5 - 4.5 g/dL   Albumin/Globulin Ratio 1.8 1.2 - 2.2   Bilirubin Total 0.6 0.0 - 1.2 mg/dL   Alkaline Phosphatase 75 39 - 117 IU/L   AST 11 0 - 40 IU/L   ALT 14 0 - 32 IU/L  Lipid panel     Status: Abnormal   Collection Time: 11/24/15  9:36 AM  Result Value Ref Range   Cholesterol, Total 205 (H) 100 - 199 mg/dL   Triglycerides 202 (H) 0 - 149 mg/dL   HDL 33 (L) >39 mg/dL   VLDL Cholesterol Cal 40 5 - 40 mg/dL   LDL Calculated 132 (H) 0 - 99 mg/dL   Chol/HDL Ratio 6.2 (H) 0.0 - 4.4 ratio units    Comment:                                   T. Chol/HDL Ratio                                             Men  Women                               1/2 Avg.Risk  3.4    3.3                                    Avg.Risk  5.0    4.4                                2X Avg.Risk  9.6    7.1                                3X Avg.Risk 23.4   11.0   Hemoglobin A1c     Status: Abnormal   Collection Time: 11/24/15  9:36 AM  Result Value Ref Range   Hgb A1c MFr Bld 5.7 (H) 4.8 - 5.6 %    Comment:          Pre-diabetes: 5.7 - 6.4          Diabetes: >6.4          Glycemic control for adults with diabetes: <7.0    Est. average glucose Bld gHb Est-mCnc 117 mg/dL  TSH     Status: None   Collection Time: 11/24/15  9:36 AM  Result Value Ref Range   TSH 1.080 0.450 - 4.500 uIU/mL  VITAMIN D 25 Hydroxy (Vit-D Deficiency, Fractures)     Status: Abnormal   Collection Time: 11/24/15  9:36 AM  Result Value Ref Range   Vit D, 25-Hydroxy 29.5 (L) 30.0 - 100.0 ng/mL  Comment: Vitamin D deficiency has been defined by the [place] practice guideline as a level of serum 25-OH vitamin D less than 20 ng/mL (1,2). The Endocrine Society went on to further define vitamin D insufficiency as a level between 21 and 29 ng/mL (2). 1. IOM (Institute of Medicine). 2010. Dietary reference    intakes for calcium and D. [place]: The    [employer]. 2. Holick MF, [place], Bischoff-Ferrari HA, et al.    Evaluation, treatment, and prevention of vitamin D    deficiency: an Endocrine Society clinical practice    guideline. JCEM. 2011 Jul; 96(7):1911-30.      PHQ2/9: Depression screen [facility] 2/9 01/17/2016 01/17/2016 11/28/2015 05/26/2015  Decreased Interest 0 0 0 0  Down, Depressed, Hopeless 0 0 0 0  PHQ - 2 Score 0 0 0 0  Altered sleeping 0 - - -  Tired, decreased energy 0 - - -  Change in appetite 0 - - -  Feeling bad or failure about yourself  0 - - -  Trouble concentrating 0 - - -  Moving slowly or fidgety/restless 0 - - -  Suicidal thoughts 0 - - -  PHQ-9 Score 0 - - -    Fall Risk: Fall Risk  01/17/2016 11/28/2015 05/26/2015  Falls in  the past year? - Yes Yes  Number falls in past yr: 1 1 2  or more  Injury with Fall? No No Yes  Risk for fall due to : History of fall(s) - -  Follow up - - Falls prevention discussed    Functional Status Survey: Is the patient deaf or have difficulty hearing?: No Does the patient have difficulty seeing, even when wearing glasses/contacts?: No Does the patient have difficulty concentrating, remembering, or making decisions?: No Does the patient have difficulty walking or climbing stairs?: No Does the patient have difficulty dressing or bathing?: No Does the patient have difficulty doing errands alone such as visiting a doctor's office or shopping?: No  Assessment & Plan  1. Medicare annual wellness visit, subsequent  Discussed importance of 150 minutes of physical activity weekly, eat two servings of fish weekly, eat one serving of tree nuts ( cashews, pistachios, pecans, almonds.Marland Kitchen) every other day, eat 6 servings of fruit/vegetables daily and drink plenty of water and avoid sweet beverages. She will call insurance to find out if bone density is covered and if so she will call back to have it scheduled.

## 2016-03-23 ENCOUNTER — Other Ambulatory Visit: Payer: Self-pay | Admitting: Family Medicine

## 2016-03-23 ENCOUNTER — Other Ambulatory Visit: Payer: Self-pay

## 2016-03-23 DIAGNOSIS — E785 Hyperlipidemia, unspecified: Secondary | ICD-10-CM

## 2016-03-25 ENCOUNTER — Encounter: Payer: Self-pay | Admitting: Family Medicine

## 2016-04-16 ENCOUNTER — Other Ambulatory Visit: Payer: Self-pay

## 2016-05-09 ENCOUNTER — Ambulatory Visit (INDEPENDENT_AMBULATORY_CARE_PROVIDER_SITE_OTHER): Payer: Medicare Other

## 2016-05-09 DIAGNOSIS — Z23 Encounter for immunization: Secondary | ICD-10-CM | POA: Diagnosis not present

## 2016-05-25 ENCOUNTER — Encounter: Payer: Self-pay | Admitting: Family Medicine

## 2016-05-25 ENCOUNTER — Other Ambulatory Visit: Payer: Self-pay | Admitting: Family Medicine

## 2016-05-25 DIAGNOSIS — Z79899 Other long term (current) drug therapy: Secondary | ICD-10-CM

## 2016-05-25 DIAGNOSIS — I10 Essential (primary) hypertension: Secondary | ICD-10-CM

## 2016-05-25 DIAGNOSIS — E559 Vitamin D deficiency, unspecified: Secondary | ICD-10-CM

## 2016-05-25 DIAGNOSIS — R809 Proteinuria, unspecified: Secondary | ICD-10-CM

## 2016-05-25 DIAGNOSIS — E785 Hyperlipidemia, unspecified: Secondary | ICD-10-CM

## 2016-05-25 DIAGNOSIS — E1129 Type 2 diabetes mellitus with other diabetic kidney complication: Secondary | ICD-10-CM

## 2016-05-28 ENCOUNTER — Other Ambulatory Visit: Payer: Self-pay

## 2016-05-28 DIAGNOSIS — E1129 Type 2 diabetes mellitus with other diabetic kidney complication: Secondary | ICD-10-CM

## 2016-05-28 DIAGNOSIS — E559 Vitamin D deficiency, unspecified: Secondary | ICD-10-CM

## 2016-05-28 DIAGNOSIS — Z79899 Other long term (current) drug therapy: Secondary | ICD-10-CM

## 2016-05-28 DIAGNOSIS — E785 Hyperlipidemia, unspecified: Secondary | ICD-10-CM

## 2016-05-28 DIAGNOSIS — R809 Proteinuria, unspecified: Secondary | ICD-10-CM

## 2016-05-29 ENCOUNTER — Encounter: Payer: Self-pay | Admitting: Family Medicine

## 2016-05-29 ENCOUNTER — Ambulatory Visit (INDEPENDENT_AMBULATORY_CARE_PROVIDER_SITE_OTHER): Payer: Medicare Other | Admitting: Family Medicine

## 2016-05-29 VITALS — BP 108/62 | HR 85 | Temp 98.6°F | Resp 16 | Ht 60.0 in | Wt 126.9 lb

## 2016-05-29 DIAGNOSIS — M05731 Rheumatoid arthritis with rheumatoid factor of right wrist without organ or systems involvement: Secondary | ICD-10-CM | POA: Diagnosis not present

## 2016-05-29 DIAGNOSIS — E1129 Type 2 diabetes mellitus with other diabetic kidney complication: Secondary | ICD-10-CM

## 2016-05-29 DIAGNOSIS — M797 Fibromyalgia: Secondary | ICD-10-CM

## 2016-05-29 DIAGNOSIS — E785 Hyperlipidemia, unspecified: Secondary | ICD-10-CM | POA: Diagnosis not present

## 2016-05-29 DIAGNOSIS — R809 Proteinuria, unspecified: Secondary | ICD-10-CM

## 2016-05-29 DIAGNOSIS — I10 Essential (primary) hypertension: Secondary | ICD-10-CM | POA: Diagnosis not present

## 2016-05-29 DIAGNOSIS — M05732 Rheumatoid arthritis with rheumatoid factor of left wrist without organ or systems involvement: Secondary | ICD-10-CM | POA: Diagnosis not present

## 2016-05-29 DIAGNOSIS — F325 Major depressive disorder, single episode, in full remission: Secondary | ICD-10-CM | POA: Diagnosis not present

## 2016-05-29 DIAGNOSIS — E038 Other specified hypothyroidism: Secondary | ICD-10-CM

## 2016-05-29 LAB — POCT UA - MICROALBUMIN: MICROALBUMIN (UR) POC: 20 mg/L

## 2016-05-29 MED ORDER — LEVOTHYROXINE SODIUM 50 MCG PO TABS: ORAL_TABLET | ORAL | 1 refills | Status: DC

## 2016-05-29 MED ORDER — CYCLOBENZAPRINE HCL 10 MG PO TABS: 10.0000 mg | ORAL_TABLET | Freq: Three times a day (TID) | ORAL | 0 refills | Status: DC | PRN

## 2016-05-29 MED ORDER — METFORMIN HCL 500 MG PO TABS: 500.0000 mg | ORAL_TABLET | Freq: Every day | ORAL | 1 refills | Status: DC

## 2016-05-29 MED ORDER — EZETIMIBE 10 MG PO TABS: 10.0000 mg | ORAL_TABLET | Freq: Every day | ORAL | 1 refills | Status: DC

## 2016-05-29 NOTE — Progress Notes (Signed)
Name: Renee Cowan   MRN: [id number]    DOB: 11/03/1945   Date:05/29/2016       Progress Note  Subjective  Chief Complaint  Chief Complaint  Patient presents with  . Medication Refill    6 month F/U  . Diabetes    Patient does not check sugar at home due to it being stable  . Hypertension  . Hyperlipidemia    New Medication wants to check blood work to see where it is now  . Depression    HPI  DMII: diagnosed years ago, on Metformin 500 mg daily also follows a diet and is physically active, she is seeing a dietician and has lost 17 lbs December 2016  she is still taking Metformin daily and denies side effects. She does not check fsbs at home. She denies polyphagia, polydipsia or polyuria. Urine micro today is back to normal, she is only on HCTZ, never got the lisinopril filled. We will recheck it today.   Hypertension: she has been off HCTZ since July 2017 and is doing well. No longer drinking caffeine, not eating a lot of salt.  Denies chest pain or palpitation.   Hypothyroidism: she is on Synthroid 50 mcg daily, no side effects, takes it fasting with no other medication.  Fatigue is intermittent  Major Depression: she has a long history of depression, worse when husband passed away in an early age from Alzheimer's - died in 09-29-2010. She stopped taking Duloxetine Spring 2017, and has been feeling better, no lack of motivation, states has emotions now and is not feeling numb. She feels like she is back to her normal self. Sleeping well  Tobacco use: she is not ready to quit yet  Dyslipidemia: based  on the results of lipid panel his cardiovascular risk factor ( using [facility] )  in the next 10 years is : 26% and she has been taking Zetia for the past 6 months, no side effects  FMS/RA: seeing Rheumatologist, still taking Methotrexate. She states no longer has muscles aches - except when she has a FMS flare,  joints is feeling well, she is taking Meloxicam and Flexeril prn     Patient Active Problem List   Diagnosis Date Noted  . Type 2 diabetes mellitus with microalbuminuria, without long-term current use of insulin ([place]) 11/28/2015  . Benign essential HTN 05/26/2015  . Depression, major, in remission ([place]) 05/26/2015  . Dyslipidemia 05/26/2015  . Type 2 diabetes mellitus with renal manifestations, controlled ([place]) 05/26/2015  . Fibromyalgia syndrome 05/26/2015  . Personal history of malignant melanoma of skin 05/26/2015  . Adult hypothyroidism 05/26/2015  . Osteoporosis, post-menopausal 05/26/2015  . Tobacco use 05/26/2015  . Vitamin D deficiency 05/26/2015  . Rheumatoid arthritis ([place]) 05/26/2015  . Acquired trigger finger 10/11/2007    Past Surgical History:  Procedure Laterality Date  . CATARACT EXTRACTION, BILATERAL Bilateral [id number] and [id number]  . CHOLECYSTECTOMY    . CYST REMOVAL TRUNK    . melaoma excesion    . trigger thumb release      Family History  Problem Relation Age of Onset  . Cancer Mother     cervical  . Tuberculosis Father     Social History   Social History  . Marital status: Married    Spouse name: N/A  . Number of children: N/A  . Years of education: N/A   Occupational History  . Not on file.   Social History Main Topics  . Smoking status: Current Every  Day Smoker    Packs/day: 0.50    Types: Cigarettes  . Smokeless tobacco: Never Used  . Alcohol use 0.0 oz/week  . Drug use: No  . Sexual activity: No   Other Topics Concern  . Not on file   Social History Narrative  . No narrative on file     Current Outpatient Prescriptions:  .  aspirin (ECOTRIN LOW STRENGTH) 81 MG EC tablet, ECOTRIN LOW STRENGTH, 81MG  (Oral Tablet Delayed Release)  1 po qday for 0 days  Quantity: 30.00;  Refills: 0   Ordered :17-Aug-2010  Steele Sizer MD;  Buddy Duty 20-Sep-2008 Active, Disp: , Rfl:  .  Calcium Carb-Cholecalciferol (CALCIUM + D3) 600-200 MG-UNIT TABS, Take by mouth., Disp: , Rfl:  .  ezetimibe (ZETIA) 10 MG  tablet, Take 1 tablet (10 mg total) by mouth daily., Disp: 90 tablet, Rfl: 1 .  folic acid (FOLVITE) 1 MG tablet, Take 1 mg by mouth daily., Disp: , Rfl: 1 .  levothyroxine (SYNTHROID, LEVOTHROID) 50 MCG tablet, TAKE 1 TABLET BY MOUTH  DAILY BEFORE BREAKFAST, Disp: 90 tablet, Rfl: 1 .  Magnesium 400 MG CAPS, Take 1 capsule by mouth daily., Disp: 30 capsule, Rfl: 0 .  meloxicam (MOBIC) 15 MG tablet, Take 1 tablet (15 mg total) by mouth daily., Disp: 90 tablet, Rfl: 1 .  metFORMIN (GLUCOPHAGE) 500 MG tablet, Take 1 tablet (500 mg total) by mouth daily with breakfast., Disp: 90 tablet, Rfl: 1 .  methotrexate (RHEUMATREX) 2.5 MG tablet, once a week. , Disp: , Rfl: 0 .  cyclobenzaprine (FLEXERIL) 10 MG tablet, Take 1 tablet (10 mg total) by mouth 3 (three) times daily as needed for muscle spasms., Disp: 30 tablet, Rfl: 0  Allergies  Allergen Reactions  . Penicillins Rash  . Sulfa Antibiotics Rash     ROS  Constitutional: Negative for fever or significant  weight change.  Respiratory: Negative for cough and shortness of breath.   Cardiovascular: Negative for chest pain or palpitations.  Gastrointestinal: Negative for abdominal pain, no bowel changes.  Musculoskeletal: Negative for gait problem or joint swelling.  Skin: Negative for rash.  Neurological: Negative for dizziness or headache.  No other specific complaints in a complete review of systems (except as listed in HPI above).  Objective  Vitals:   05/29/16 1010  BP: 108/62  Pulse: 85  Resp: 16  Temp: 98.6 F (37 C)  TempSrc: Oral  SpO2: 96%  Weight: 126 lb 14.4 oz (57.6 kg)  Height: 5' (1.524 m)    Body mass index is 24.78 kg/m.  Physical Exam  Constitutional: Patient appears well-developed and well-nourished. Obese  No distress.  HEENT: head atraumatic, normocephalic, pupils equal and reactive to light,  neck supple, throat within normal limits Cardiovascular: Normal rate, regular rhythm and normal heart sounds.  No  murmur heard. No BLE edema. Pulmonary/Chest: Effort normal and breath sounds normal. No respiratory distress. Abdominal: Soft.  There is no tenderness. Psychiatric: Patient has a normal mood and affect. behavior is normal. Judgment and thought content normal.  PHQ2/9: Depression screen [facility] 2/9 05/29/2016 01/17/2016 01/17/2016 11/28/2015 05/26/2015  Decreased Interest 0 0 0 0 0  Down, Depressed, Hopeless 0 0 0 0 0  PHQ - 2 Score 0 0 0 0 0  Altered sleeping - 0 - - -  Tired, decreased energy - 0 - - -  Change in appetite - 0 - - -  Feeling bad or failure about yourself  - 0 - - -  Trouble  concentrating - 0 - - -  Moving slowly or fidgety/restless - 0 - - -  Suicidal thoughts - 0 - - -  PHQ-9 Score - 0 - - -    Fall Risk: Fall Risk  05/29/2016 01/17/2016 11/28/2015 05/26/2015  Falls in the past year? No - Yes Yes  Number falls in past yr: - 1 1 2  or more  Injury with Fall? - No No Yes  Risk for fall due to : - History of fall(s) - -  Follow up - - - Falls prevention discussed     Functional Status Survey: Is the patient deaf or have difficulty hearing?: No Does the patient have difficulty seeing, even when wearing glasses/contacts?: No Does the patient have difficulty concentrating, remembering, or making decisions?: No Does the patient have difficulty walking or climbing stairs?: No Does the patient have difficulty dressing or bathing?: No Does the patient have difficulty doing errands alone such as visiting a doctor's office or shopping?: No  Diabetic Foot Exam - Simple   Simple Foot Form Diabetic Foot exam was performed with the following findings:  Yes 05/29/2016 10:46 AM  Visual Inspection See comments:  Yes Sensation Testing Intact to touch and monofilament testing bilaterally:  Yes Pulse Check Posterior Tibialis and Dorsalis pulse intact bilaterally:  Yes Comments Thick first toe nails both feet     Assessment & Plan  1. Type 2 diabetes mellitus with microalbuminuria,  without long-term current use of insulin (HCC)  - metFORMIN (GLUCOPHAGE) 500 MG tablet; Take 1 tablet (500 mg total) by mouth daily with breakfast.  Dispense: 90 tablet; Refill: 1 - POCT UA - Microalbumin  2. Fibromyalgia syndrome  Doing well   3. Dyslipidemia  - ezetimibe (ZETIA) 10 MG tablet; Take 1 tablet (10 mg total) by mouth daily.  Dispense: 90 tablet; Refill: 1  4. Benign essential HTN  Off medication and is doing well   5. Depression, major, in remission ([place])  Doing well at this time  6. 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 Trudie Reed  7. Other specified hypothyroidism  - TSH - levothyroxine (SYNTHROID, LEVOTHROID) 50 MCG tablet; TAKE 1 TABLET BY MOUTH  DAILY BEFORE BREAKFAST  Dispense: 90 tablet; Refill: 1

## 2016-05-30 LAB — LIPID PANEL
CHOL/HDL RATIO: 5.8 ratio — AB (ref <5.0)
CHOLESTEROL: 184 mg/dL (ref <200)
HDL: 32 mg/dL — ABNORMAL LOW (ref >50)
LDL Cholesterol: 115 mg/dL — ABNORMAL HIGH (ref <100)
TRIGLYCERIDES: 185 mg/dL — AB (ref <150)
VLDL: 37 mg/dL — AB (ref <30)

## 2016-05-30 LAB — COMPLETE METABOLIC PANEL WITHOUT GFR
ALT: 16 U/L (ref 6–29)
AST: 17 U/L (ref 10–35)
Albumin: 4.2 g/dL (ref 3.6–5.1)
Alkaline Phosphatase: 73 U/L (ref 33–130)
BUN: 15 mg/dL (ref 7–25)
CO2: 26 mmol/L (ref 20–31)
Calcium: 9.5 mg/dL (ref 8.6–10.4)
Chloride: 102 mmol/L (ref 98–110)
Creat: 0.69 mg/dL (ref 0.50–0.99)
GFR, Est African American: >89 mL/min
GFR, Est Non African American: >89 mL/min
Glucose, Bld: 94 mg/dL (ref 65–99)
Potassium: 4.6 mmol/L (ref 3.5–5.3)
Sodium: 138 mmol/L (ref 135–146)
Total Bilirubin: 0.5 mg/dL (ref 0.2–1.2)
Total Protein: 6.7 g/dL (ref 6.1–8.1)

## 2016-05-30 LAB — TSH: TSH: 1.36 m[IU]/L

## 2016-05-30 LAB — VITAMIN B12: Vitamin B-12: 368 pg/mL (ref 200–1100)

## 2016-05-31 LAB — CBC WITH DIFFERENTIAL/PLATELET
BASOS PCT: 0 %
Basophils Absolute: 0 cells/uL (ref 0–200)
EOS ABS: 644 {cells}/uL — AB (ref 15–500)
Eosinophils Relative: 7 %
HEMATOCRIT: 43.3 % (ref 35.0–45.0)
Hemoglobin: 14.3 g/dL (ref 11.7–15.5)
LYMPHS PCT: 21 %
Lymphs Abs: 1932 cells/uL (ref 850–3900)
MCH: 33.1 pg — ABNORMAL HIGH (ref 27.0–33.0)
MCHC: 33 g/dL (ref 32.0–36.0)
MCV: 100.2 fL — AB (ref 80.0–100.0)
MONOS PCT: 6 %
MPV: 10.4 fL (ref 7.5–12.5)
Monocytes Absolute: 552 cells/uL (ref 200–950)
NEUTROS PCT: 66 %
Neutro Abs: 6072 cells/uL (ref 1500–7800)
PLATELETS: 359 10*3/uL (ref 140–400)
RBC: 4.32 MIL/uL (ref 3.80–5.10)
RDW: 13.8 % (ref 11.0–15.0)
WBC: 9.2 10*3/uL (ref 3.8–10.8)

## 2016-05-31 LAB — HEMOGLOBIN A1C
Hgb A1c MFr Bld: 5 % (ref <5.7)
MEAN PLASMA GLUCOSE: 97 mg/dL

## 2016-05-31 LAB — VITAMIN D 25 HYDROXY (VIT D DEFICIENCY, FRACTURES): VIT D 25 HYDROXY: 30 ng/mL (ref 30–100)

## 2016-09-04 ENCOUNTER — Telehealth: Payer: Self-pay | Admitting: Family Medicine

## 2016-09-04 NOTE — Telephone Encounter (Signed)
Pt is asking for a referral to [facility] . Says that she has spoke with you about this issue in the past. It for incontinence.

## 2016-09-05 NOTE — Telephone Encounter (Signed)
Patient states it was just worst with her cold a couple of weeks back, and every time she coughed she had an incident. Patient states she does not have any symptoms of a UTI and this incontinence is more aggravating then anything. Patient wants to wait until her appointment.

## 2016-09-05 NOTE — Telephone Encounter (Signed)
Ask her to come in sooner, just to talk about it, it may an infection on top of it, not safe to wait

## 2016-09-05 NOTE — Telephone Encounter (Signed)
We may have discussed in the past, but not in the past few visits. I need notes to be able to make a referral. She can try to scheduled it without a referral ( on her own) or she will need a follow up. I am sorry

## 2016-09-05 NOTE — Telephone Encounter (Signed)
Patient states she has tried [facility] and they will not see her without referral. Patient states her symptoms are getting worst with the incontinence and will discuss this further in her appointment in June.

## 2016-11-23 ENCOUNTER — Other Ambulatory Visit: Payer: Self-pay | Admitting: Family Medicine

## 2016-11-23 DIAGNOSIS — E039 Hypothyroidism, unspecified: Secondary | ICD-10-CM

## 2016-11-23 DIAGNOSIS — E038 Other specified hypothyroidism: Secondary | ICD-10-CM

## 2016-11-23 NOTE — Telephone Encounter (Signed)
Pt has 87mo f/u with Dr. Ancil Boozer on 12/03/2016. 30-day supply provided.

## 2016-12-03 ENCOUNTER — Ambulatory Visit (INDEPENDENT_AMBULATORY_CARE_PROVIDER_SITE_OTHER): Payer: Medicare Other | Admitting: Family Medicine

## 2016-12-03 ENCOUNTER — Encounter: Payer: Self-pay | Admitting: Family Medicine

## 2016-12-03 VITALS — BP 132/84 | HR 80 | Temp 98.5°F | Resp 16 | Ht 60.0 in | Wt 123.6 lb

## 2016-12-03 DIAGNOSIS — E1121 Type 2 diabetes mellitus with diabetic nephropathy: Secondary | ICD-10-CM | POA: Diagnosis not present

## 2016-12-03 DIAGNOSIS — Z85820 Personal history of malignant melanoma of skin: Secondary | ICD-10-CM | POA: Diagnosis not present

## 2016-12-03 DIAGNOSIS — M05731 Rheumatoid arthritis with rheumatoid factor of right wrist without organ or systems involvement: Secondary | ICD-10-CM | POA: Diagnosis not present

## 2016-12-03 DIAGNOSIS — M797 Fibromyalgia: Secondary | ICD-10-CM

## 2016-12-03 DIAGNOSIS — I10 Essential (primary) hypertension: Secondary | ICD-10-CM | POA: Diagnosis not present

## 2016-12-03 DIAGNOSIS — M05732 Rheumatoid arthritis with rheumatoid factor of left wrist without organ or systems involvement: Secondary | ICD-10-CM

## 2016-12-03 DIAGNOSIS — F325 Major depressive disorder, single episode, in full remission: Secondary | ICD-10-CM | POA: Diagnosis not present

## 2016-12-03 DIAGNOSIS — E785 Hyperlipidemia, unspecified: Secondary | ICD-10-CM

## 2016-12-03 DIAGNOSIS — E039 Hypothyroidism, unspecified: Secondary | ICD-10-CM | POA: Diagnosis not present

## 2016-12-03 DIAGNOSIS — N3946 Mixed incontinence: Secondary | ICD-10-CM | POA: Diagnosis not present

## 2016-12-03 LAB — POCT UA - MICROALBUMIN: Microalbumin Ur, POC: 20 mg/L

## 2016-12-03 LAB — POCT GLYCOSYLATED HEMOGLOBIN (HGB A1C): HEMOGLOBIN A1C: 5.3

## 2016-12-03 MED ORDER — METFORMIN HCL 500 MG PO TABS: 500.0000 mg | ORAL_TABLET | Freq: Every day | ORAL | 1 refills | Status: DC

## 2016-12-03 MED ORDER — LEVOTHYROXINE SODIUM 50 MCG PO TABS: ORAL_TABLET | ORAL | 1 refills | Status: DC

## 2016-12-03 NOTE — Progress Notes (Signed)
Name: Renee Cowan   MRN: [id number]    DOB: 1945/08/01   Date:12/03/2016       Progress Note  Subjective  Chief Complaint  Chief Complaint  Patient presents with  . Medication Refill    6 month F/U  . Diabetes    Does not check sugar at home  . Hypothyroidism  . Depression  . Dyslipidemia    HPI  DMII: diagnosed years ago, on Metformin 500 mg daily also follows a diet and is physically active, she has resumed her diet since last visit in December and has lost 4 lbs. She is still taking Metformin daily and denies side effects. She does not check fsbs at home. She denies polyphagia, polydipsia or polyuria. Urine micro today is back to normal,   Hypertension: she has been off HCTZ since July 2017 and is doing well. No longer drinking caffeine, not eating a lot of salt. Denies chest pain or palpitation.   Hypothyroidism: she is on Synthroid 50 mcg daily, no side effects, takes it fasting with no other medication. Fatigue is intermittent  Major Depression: she has a long history of depression, worse when husband passed away in an early age from Alzheimer's - died in 2010-10-19. She stopped taking Duloxetine Spring 2017, and she was doing well, but had a relapse when his friend died 2016/10/18, she felt down for weeks but is doing better now. Advised to go back to a therapist.   Tobacco use: she is not ready to quit yet  Dyslipidemia: based on the results of lipid panel his cardiovascular risk factor ( using [facility] ) in the next 10 years is : 26% and she was taking Zetia, but did not affect LDL as much and she stopped medication  FMS/RA: seeing Rheumatologist, still taking Methotrexate. She states no longer has muscles aches - except when she has a FMS flare,  joints is feeling well, she is taking Meloxicam and Flexeril prn   Mixed incontinence: seeing Urologist - Dr. Carolan Clines [facility].   Patient Active Problem List   Diagnosis Date Noted  . Type 2 diabetes  mellitus with microalbuminuria, without long-term current use of insulin ([place]) 11/28/2015  . Benign essential HTN 05/26/2015  . Depression, major, in remission ([place]) 05/26/2015  . Dyslipidemia 05/26/2015  . Type 2 diabetes mellitus with renal manifestations, controlled ([place]) 05/26/2015  . Fibromyalgia syndrome 05/26/2015  . Personal history of malignant melanoma of skin 05/26/2015  . Adult hypothyroidism 05/26/2015  . Osteoporosis, post-menopausal 05/26/2015  . Tobacco use 05/26/2015  . Vitamin D deficiency 05/26/2015  . Rheumatoid arthritis ([place]) 05/26/2015  . Acquired trigger finger 10/11/2007    Past Surgical History:  Procedure Laterality Date  . CATARACT EXTRACTION, BILATERAL Bilateral [id number] and [id number]/[id number]  . CHOLECYSTECTOMY    . CYST REMOVAL TRUNK    . melaoma excesion    . trigger thumb release      Family History  Problem Relation Age of Onset  . Cancer Mother        cervical  . Tuberculosis Father     Social History   Social History  . Marital status: Married    Spouse name: N/A  . Number of children: N/A  . Years of education: N/A   Occupational History  . Not on file.   Social History Main Topics  . Smoking status: Current Every Day Smoker    Packs/day: 0.50    Years: 40.00    Types: Cigarettes  Start date: 12/03/1976  . Smokeless tobacco: Never Used  . Alcohol use 0.0 oz/week     Comment: occasionally  . Drug use: No  . Sexual activity: No   Other Topics Concern  . Not on file   Social History Narrative  . No narrative on file     Current Outpatient Prescriptions:  .  aspirin (ECOTRIN LOW STRENGTH) 81 MG EC tablet, ECOTRIN LOW STRENGTH, 81MG  (Oral Tablet Delayed Release)  1 po qday for 0 days  Quantity: 30.00;  Refills: 0   Ordered :17-Aug-2010  Steele Sizer MD;  Buddy Duty 20-Sep-2008 Active, Disp: , Rfl:  .  Calcium Carb-Cholecalciferol (CALCIUM + D3) 600-200 MG-UNIT TABS, Take by mouth., Disp: , Rfl:  .  cyanocobalamin 500 MCG  tablet, Take 500 mcg by mouth daily., Disp: , Rfl:  .  cyclobenzaprine (FLEXERIL) 10 MG tablet, Take 1 tablet (10 mg total) by mouth 3 (three) times daily as needed for muscle spasms., Disp: 30 tablet, Rfl: 0 .  folic acid (FOLVITE) 1 MG tablet, Take 1 mg by mouth daily., Disp: , Rfl: 1 .  levothyroxine (SYNTHROID, LEVOTHROID) 50 MCG tablet, TAKE 1 TABLET BY MOUTH  DAILY BEFORE BREAKFAST, Disp: 90 tablet, Rfl: 1 .  Magnesium 400 MG CAPS, Take 1 capsule by mouth daily., Disp: 30 capsule, Rfl: 0 .  meloxicam (MOBIC) 15 MG tablet, Take 1 tablet (15 mg total) by mouth daily. (Patient taking differently: Take 15 mg by mouth as needed. ), Disp: 90 tablet, Rfl: 1 .  metFORMIN (GLUCOPHAGE) 500 MG tablet, Take 1 tablet (500 mg total) by mouth daily with breakfast., Disp: 90 tablet, Rfl: 1 .  methotrexate (RHEUMATREX) 2.5 MG tablet, once a week. , Disp: , Rfl: 0  Allergies  Allergen Reactions  . Penicillins Rash  . Sulfa Antibiotics Rash     ROS  Constitutional: Negative for fever or significant  weight change.  Respiratory: Negative for cough and shortness of breath.   Cardiovascular: Negative for chest pain or palpitations.  Gastrointestinal: Negative for abdominal pain, no bowel changes.  Musculoskeletal: Negative for gait problem or joint swelling.  Skin: Negative for rash.  Neurological: Negative for dizziness or headache.  No other specific complaints in a complete review of systems (except as listed in HPI above).  Objective  Vitals:   12/03/16 1001  BP: 132/84  Pulse: 80  Resp: 16  Temp: 98.5 F (36.9 C)  TempSrc: Oral  SpO2: 97%  Weight: 123 lb 9.6 oz (56.1 kg)  Height: 5' (1.524 m)    Body mass index is 24.14 kg/m.  Physical Exam  Constitutional: Patient appears well-developed and well-nourished.  No distress.  HEENT: head atraumatic, normocephalic, pupils equal and reactive to light, neck supple, throat within normal limits Cardiovascular: Normal rate, regular rhythm  and normal heart sounds.  No murmur heard. No BLE edema. Pulmonary/Chest: Effort normal and breath sounds normal. No respiratory distress. Abdominal: Soft.  There is no tenderness. Psychiatric: Patient has a normal mood and affect. behavior is normal. Judgment and thought content normal.  Recent Results (from the past 2160 hour(s))  POCT HgB A1C     Status: Normal   Collection Time: 12/03/16 10:11 AM  Result Value Ref Range   Hemoglobin A1C 5.3   POCT UA - Microalbumin     Status: Normal   Collection Time: 12/03/16 10:11 AM  Result Value Ref Range   Microalbumin Ur, POC 20 mg/L   Creatinine, POC  mg/dL   Albumin/Creatinine Ratio, Urine, POC  PHQ2/9: Depression screen [facility] 2/9 12/03/2016 05/29/2016 01/17/2016 01/17/2016 11/28/2015  Decreased Interest 0 0 0 0 0  Down, Depressed, Hopeless 0 0 0 0 0  PHQ - 2 Score 0 0 0 0 0  Altered sleeping - - 0 - -  Tired, decreased energy - - 0 - -  Change in appetite - - 0 - -  Feeling bad or failure about yourself  - - 0 - -  Trouble concentrating - - 0 - -  Moving slowly or fidgety/restless - - 0 - -  Suicidal thoughts - - 0 - -  PHQ-9 Score - - 0 - -    Fall Risk: Fall Risk  12/03/2016 05/29/2016 01/17/2016 11/28/2015 05/26/2015  Falls in the past year? Yes No - Yes Yes  Number falls in past yr: 1 - 1 1 2  or more  Injury with Fall? No - No No Yes  Risk for fall due to : - - History of fall(s) - -  Follow up - - - - Falls prevention discussed    Functional Status Survey: Is the patient deaf or have difficulty hearing?: No Does the patient have difficulty seeing, even when wearing glasses/contacts?: No Does the patient have difficulty concentrating, remembering, or making decisions?: No Does the patient have difficulty walking or climbing stairs?: No Does the patient have difficulty dressing or bathing?: No Does the patient have difficulty doing errands alone such as visiting a doctor's office or shopping?: No    Assessment & Plan  1.  Controlled type 2 diabetes mellitus with diabetic nephropathy, without long-term current use of insulin (HCC)  - POCT HgB A1C - POCT UA - Microalbumin - metFORMIN (GLUCOPHAGE) 500 MG tablet; Take 1 tablet (500 mg total) by mouth daily with breakfast.  Dispense: 90 tablet; Refill: 1  2. Fibromyalgia syndrome  She is doing well   3. Dyslipidemia  She stopped Zetia because it was not helping much and HDL is still low, she is eating fish and tree nuts.  4. Benign essential HTN  At goal, off HCTZ  5. Depression, major, in remission [facility])  She had a mild relapse when she lost a close friend for a heart attack  6. 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)  Seeing Rheumatologist and symptoms are stable  7. Personal history of malignant melanoma of skin  Advised to see Dermatologist yearly  8. Adult hypothyroidism  - levothyroxine (SYNTHROID, LEVOTHROID) 50 MCG tablet; TAKE 1 TABLET BY MOUTH  DAILY BEFORE BREAKFAST  Dispense: 90 tablet; Refill: 1   9. Mixed stress and urge urinary incontinence  Seeing Urologist

## 2017-01-04 ENCOUNTER — Telehealth: Payer: Self-pay | Admitting: Family Medicine

## 2017-01-18 ENCOUNTER — Encounter: Payer: Medicare Other | Admitting: Family Medicine

## 2017-02-11 ENCOUNTER — Encounter: Payer: Self-pay | Admitting: Family Medicine

## 2017-02-11 ENCOUNTER — Ambulatory Visit (INDEPENDENT_AMBULATORY_CARE_PROVIDER_SITE_OTHER): Payer: Medicare Other | Admitting: Family Medicine

## 2017-02-11 VITALS — BP 124/78 | HR 88 | Temp 98.7°F | Resp 16 | Ht 60.0 in | Wt 119.4 lb

## 2017-02-11 DIAGNOSIS — E559 Vitamin D deficiency, unspecified: Secondary | ICD-10-CM | POA: Diagnosis not present

## 2017-02-11 DIAGNOSIS — R634 Abnormal weight loss: Secondary | ICD-10-CM | POA: Diagnosis not present

## 2017-02-11 DIAGNOSIS — N3946 Mixed incontinence: Secondary | ICD-10-CM

## 2017-02-11 DIAGNOSIS — Z0000 Encounter for general adult medical examination without abnormal findings: Secondary | ICD-10-CM | POA: Diagnosis not present

## 2017-02-11 DIAGNOSIS — Z23 Encounter for immunization: Secondary | ICD-10-CM | POA: Diagnosis not present

## 2017-02-11 DIAGNOSIS — R058 Other specified cough: Secondary | ICD-10-CM

## 2017-02-11 DIAGNOSIS — Z79899 Other long term (current) drug therapy: Secondary | ICD-10-CM

## 2017-02-11 DIAGNOSIS — Z1231 Encounter for screening mammogram for malignant neoplasm of breast: Secondary | ICD-10-CM

## 2017-02-11 DIAGNOSIS — Z1211 Encounter for screening for malignant neoplasm of colon: Secondary | ICD-10-CM | POA: Diagnosis not present

## 2017-02-11 DIAGNOSIS — R630 Anorexia: Secondary | ICD-10-CM

## 2017-02-11 DIAGNOSIS — R05 Cough: Secondary | ICD-10-CM | POA: Diagnosis not present

## 2017-02-11 DIAGNOSIS — E785 Hyperlipidemia, unspecified: Secondary | ICD-10-CM | POA: Diagnosis not present

## 2017-02-11 DIAGNOSIS — E039 Hypothyroidism, unspecified: Secondary | ICD-10-CM

## 2017-02-11 NOTE — Progress Notes (Signed)
Name: Renee Cowan   MRN: [id number]    DOB: 12-05-1945   Date:02/11/2017       Progress Note  Subjective  Chief Complaint  Chief Complaint  Patient presents with  . Medicare Wellness  . Weight Loss    HPI  Functional ability/safety issues: No Issues - totally independent Hearing issues: Addressed  Activities of daily living: Discussed Home safety issues: No Issues  End Of Life Planning: Offered verbal information regarding advanced directives, healthcare power of attorney.  Preventative care, Health maintenance, Preventative health measures discussed.  Preventative screenings discussed today: lab work, colonoscopy,  mammogram, DEXA. - we will recheck it   Low Dose CT Chest recommended if Age 4-80 years, 30 pack-year currently smoking OR have quit w/in 15years.   Lifestyle risk factor issued reviewed: Diet, exercise, weight management, advised patient smoking is not healthy, nutrition/diet.  Preventative health measures discussed (5-10 year plan).  Reviewed and recommended vaccinations: - Pneumovax  - Prevnar  - Annual Influenza - Zostavax - today  - Tdap   Depression screening: Done Fall risk screening: Done Discuss ADLs/IADLs: Done  Current medical providers: See HPI  Other health risk factors identified this visit: No other issues Cognitive impairment issues: None identified  All above discussed with patient. Appropriate education, counseling and referral will be made based upon the above.   Weight loss: she went on vacation to [place] and had an URI two weeks ago, she states she is still coughing but no wheezing or SOB. She is still feeling very tired, lost 5 lbs, has some anxiety and tremors and loose stools. Skin is dryer than usual. Explained it may be secondary to suppressed TSH, we will recheck labs. She feels flushed at times .   Urinary incontinence: she saw Dr. Carolan Clines earlier in 2018, and had some testing, but the provider left the  practice. She would like to have botox, we will refer her to female Urologist. Discussed PT also .    Patient Active Problem List   Diagnosis Date Noted  . Type 2 diabetes mellitus with microalbuminuria, without long-term current use of insulin ([place]) 11/28/2015  . Benign essential HTN 05/26/2015  . Depression, major, in remission ([place]) 05/26/2015  . Dyslipidemia 05/26/2015  . Type 2 diabetes mellitus with renal manifestations, controlled ([place]) 05/26/2015  . Fibromyalgia syndrome 05/26/2015  . Personal history of malignant melanoma of skin 05/26/2015  . Adult hypothyroidism 05/26/2015  . Osteoporosis, post-menopausal 05/26/2015  . Tobacco use 05/26/2015  . Vitamin D deficiency 05/26/2015  . Rheumatoid arthritis ([place]) 05/26/2015  . Acquired trigger finger 10/11/2007    Past Surgical History:  Procedure Laterality Date  . CATARACT EXTRACTION, BILATERAL Bilateral [id number] and 07/0202017  . CHOLECYSTECTOMY    . CYST REMOVAL TRUNK    . melaoma excesion    . trigger thumb release      Family History  Problem Relation Age of Onset  . Cancer Mother        cervical  . Tuberculosis Father     Social History   Social History  . Marital status: Married    Spouse name: N/A  . Number of children: N/A  . Years of education: N/A   Occupational History  . Not on file.   Social History Main Topics  . Smoking status: Current Every Day Smoker    Packs/day: 0.50    Years: 40.00    Types: Cigarettes    Start date: 12/03/1976  . Smokeless tobacco: Never Used  . Alcohol  use 0.0 oz/week     Comment: occasionally  . Drug use: No  . Sexual activity: No   Other Topics Concern  . Not on file   Social History Narrative  . No narrative on file     Current Outpatient Prescriptions:  .  aspirin (ECOTRIN LOW STRENGTH) 81 MG EC tablet, ECOTRIN LOW STRENGTH, 81MG  (Oral Tablet Delayed Release)  1 po qday for 0 days  Quantity: 30.00;  Refills: 0   Ordered :17-Aug-2010  Steele Sizer MD;   Buddy Duty 20-Sep-2008 Active, Disp: , Rfl:  .  Calcium Carb-Cholecalciferol (CALCIUM + D3) 600-200 MG-UNIT TABS, Take by mouth., Disp: , Rfl:  .  cyanocobalamin 500 MCG tablet, Take 500 mcg by mouth daily., Disp: , Rfl:  .  cyclobenzaprine (FLEXERIL) 10 MG tablet, Take 1 tablet (10 mg total) by mouth 3 (three) times daily as needed for muscle spasms., Disp: 30 tablet, Rfl: 0 .  folic acid (FOLVITE) 1 MG tablet, Take 1 mg by mouth daily., Disp: , Rfl: 1 .  levothyroxine (SYNTHROID, LEVOTHROID) 50 MCG tablet, TAKE 1 TABLET BY MOUTH  DAILY BEFORE BREAKFAST, Disp: 90 tablet, Rfl: 1 .  Magnesium 400 MG CAPS, Take 1 capsule by mouth daily., Disp: 30 capsule, Rfl: 0 .  meloxicam (MOBIC) 15 MG tablet, Take 1 tablet (15 mg total) by mouth daily. (Patient taking differently: Take 15 mg by mouth as needed. ), Disp: 90 tablet, Rfl: 1 .  metFORMIN (GLUCOPHAGE) 500 MG tablet, Take 1 tablet (500 mg total) by mouth daily with breakfast., Disp: 90 tablet, Rfl: 1 .  methotrexate (RHEUMATREX) 2.5 MG tablet, once a week. , Disp: , Rfl: 0  Allergies  Allergen Reactions  . Penicillins Rash  . Sulfa Antibiotics Rash     ROS  Constitutional: Negative for fever, positive for  weight change.  Respiratory: Positive  for cough no  shortness of breath.   Cardiovascular: Negative for chest pain or palpitations.  Gastrointestinal: Negative for abdominal pain, mild change in bowel movements, slightly loose - over the past few days - just returned from vacation  Musculoskeletal: Negative for gait problem or joint swelling.  Skin: Negative for rash. Skin is more dry  Neurological: Positive for mild dizziness but no  headache.  No other specific complaints in a complete review of systems (except as listed in HPI above).  Objective  Vitals:   02/11/17 1405  BP: 124/78  Pulse: 88  Resp: 16  Temp: 98.7 F (37.1 C)  SpO2: 93%  Weight: 119 lb 6 oz (54.1 kg)  Height: 5' (1.524 m)    Body mass index is 23.31  kg/m.  Physical Exam  Constitutional: Patient appears well-developed and well-nourished. No distress.  HENT: Head: Normocephalic and atraumatic. Ears: B TMs ok, no erythema or effusion; Nose: Nose normal. Mouth/Throat: Oropharynx is clear and moist. No oropharyngeal exudate.  Eyes: Conjunctivae and EOM are normal. Pupils are equal, round, and reactive to light. No scleral icterus.  Neck: Normal range of motion. Neck supple. No JVD present. No thyromegaly present.  Cardiovascular: Normal rate, regular rhythm and normal heart sounds.  No murmur heard. No BLE edema. Pulmonary/Chest: Effort normal and breath sounds normal. No respiratory distress. Abdominal: Soft. Bowel sounds are normal, no distension. There is no tenderness. no masses Breast: no lumps or masses, no nipple discharge or rashes FEMALE GENITALIA:  External genitalia normal External urethra normal Pelvic not done RECTAL: not done Musculoskeletal: Normal range of motion, no joint effusions. No gross deformities Neurological:  he is alert and oriented to person, place, and time. No cranial nerve deficit. Coordination, balance, strength, speech and gait are normal.  Skin: Skin is warm and dry. No rash noted. No erythema.  Psychiatric: Patient has a normal mood and affect. behavior is normal. Judgment and thought content normal.  Recent Results (from the past 2160 hour(s))  POCT HgB A1C     Status: Normal   Collection Time: 12/03/16 10:11 AM  Result Value Ref Range   Hemoglobin A1C 5.3   POCT UA - Microalbumin     Status: Normal   Collection Time: 12/03/16 10:11 AM  Result Value Ref Range   Microalbumin Ur, POC 20 mg/L   Creatinine, POC  mg/dL   Albumin/Creatinine Ratio, Urine, POC       PHQ2/9: Depression screen [facility] 2/9 02/11/2017 12/03/2016 05/29/2016 01/17/2016 01/17/2016  Decreased Interest 1 0 0 0 0  Down, Depressed, Hopeless 1 0 0 0 0  PHQ - 2 Score 2 0 0 0 0  Altered sleeping 0 - - 0 -  Tired, decreased energy 1 - -  0 -  Change in appetite 1 - - 0 -  Feeling bad or failure about yourself  0 - - 0 -  Trouble concentrating 1 - - 0 -  Moving slowly or fidgety/restless 0 - - 0 -  Suicidal thoughts 0 - - 0 -  PHQ-9 Score 5 - - 0 -  Difficult doing work/chores Somewhat difficult - - - -   Recently visited some older relatives, and is changing therapist, does not want to start medications at this time   Fall Risk: Fall Risk  02/11/2017 12/03/2016 05/29/2016 01/17/2016 11/28/2015  Falls in the past year? Yes Yes No - Yes  Comment - - - - -  Number falls in past yr: 2 or more 1 - 1 1  Injury with Fall? No No - No No  Risk for fall due to : - - - History of fall(s) -  Follow up - - - - -     Functional Status Survey: Is the patient deaf or have difficulty hearing?: No Does the patient have difficulty seeing, even when wearing glasses/contacts?: No Does the patient have difficulty concentrating, remembering, or making decisions?: No Does the patient have difficulty walking or climbing stairs?: No Does the patient have difficulty dressing or bathing?: No Does the patient have difficulty doing errands alone such as visiting a doctor's office or shopping?: No   Assessment & Plan  1. Medicare annual wellness visit, subsequent  Discussed importance of 150 minutes of physical activity weekly, eat two servings of fish weekly, eat one serving of tree nuts ( cashews, pistachios, pecans, almonds.Marland Kitchen) every other day, eat 6 servings of fruit/vegetables daily and drink plenty of water and avoid sweet beverages.  - Bone density   2. Weight loss  - Vitamin B12 - VITAMIN D 25 Hydroxy (Vit-D Deficiency, Fractures) - COMPLETE METABOLIC PANEL WITH GFR - CBC with Differential/Platelet - TSH  3. Lack of appetite  - CBC with Differential/Platelet  4. Productive cough  - CBC with Differential/Platelet - DG Chest 2 View; Future  5. Dyslipidemia  - Lipid panel  6. Hypothyroidism, unspecified type  - TSH  7.  Vitamin D deficiency  - VITAMIN D 25 Hydroxy (Vit-D Deficiency, Fractures)  8. Long-term use of high-risk medication  - Vitamin B12  9. Encounter for screening mammogram for breast cancer  - MM Digital Screening; Future   10. Screening for  colon cancer  - Cologuard  11. Mixed incontinence urge and stress (female)(female)  - Ambulatory referral to Urology

## 2017-02-11 NOTE — Addendum Note (Signed)
Addended by: Lolita Rieger D on: 02/11/2017 03:02 PM   Modules accepted: Orders

## 2017-02-11 NOTE — Patient Instructions (Signed)
Preventive Care 65 Years and Older, Female Preventive care refers to lifestyle choices and visits with your health care provider that can promote health and wellness. What does preventive care include?  A yearly physical exam. This is also called an annual well check.  Dental exams once or twice a year.  Routine eye exams. Ask your health care provider how often you should have your eyes checked.  Personal lifestyle choices, including: ? Daily care of your teeth and gums. ? Regular physical activity. ? Eating a healthy diet. ? Avoiding tobacco and drug use. ? Limiting alcohol use. ? Practicing safe sex. ? Taking low-dose aspirin every day. ? Taking vitamin and mineral supplements as recommended by your health care provider. What happens during an annual well check? The services and screenings done by your health care provider during your annual well check will depend on your age, overall health, lifestyle risk factors, and family history of disease. Counseling Your health care provider may ask you questions about your:  Alcohol use.  Tobacco use.  Drug use.  Emotional well-being.  Home and relationship well-being.  Sexual activity.  Eating habits.  History of falls.  Memory and ability to understand (cognition).  Work and work environment.  Reproductive health.  Screening You may have the following tests or measurements:  Height, weight, and BMI.  Blood pressure.  Lipid and cholesterol levels. These may be checked every 5 years, or more frequently if you are over 50 years old.  Skin check.  Lung cancer screening. You may have this screening every year starting at age 55 if you have a 30-pack-year history of smoking and currently smoke or have quit within the past 15 years.  Fecal occult blood test (FOBT) of the stool. You may have this test every year starting at age 50.  Flexible sigmoidoscopy or colonoscopy. You may have a sigmoidoscopy every 5 years or  a colonoscopy every 10 years starting at age 50.  Hepatitis C blood test.  Hepatitis B blood test.  Sexually transmitted disease (STD) testing.  Diabetes screening. This is done by checking your blood sugar (glucose) after you have not eaten for a while (fasting). You may have this done every 1-3 years.  Bone density scan. This is done to screen for osteoporosis. You may have this done starting at age 65.  Mammogram. This may be done every 1-2 years. Talk to your health care provider about how often you should have regular mammograms.  Talk with your health care provider about your test results, treatment options, and if necessary, the need for more tests. Vaccines Your health care provider may recommend certain vaccines, such as:  Influenza vaccine. This is recommended every year.  Tetanus, diphtheria, and acellular pertussis (Tdap, Td) vaccine. You may need a Td booster every 10 years.  Varicella vaccine. You may need this if you have not been vaccinated.  Zoster vaccine. You may need this after age 60.  Measles, mumps, and rubella (MMR) vaccine. You may need at least one dose of MMR if you were born in 1957 or later. You may also need a second dose.  Pneumococcal 13-valent conjugate (PCV13) vaccine. One dose is recommended after age 65.  Pneumococcal polysaccharide (PPSV23) vaccine. One dose is recommended after age 65.  Meningococcal vaccine. You may need this if you have certain conditions.  Hepatitis A vaccine. You may need this if you have certain conditions or if you travel or work in places where you may be exposed to hepatitis   A.  Hepatitis B vaccine. You may need this if you have certain conditions or if you travel or work in places where you may be exposed to hepatitis B.  Haemophilus influenzae type b (Hib) vaccine. You may need this if you have certain conditions.  Talk to your health care provider about which screenings and vaccines you need and how often you  need them. This information is not intended to replace advice given to you by your health care provider. Make sure you discuss any questions you have with your health care provider. Document Released: 07/08/2015 Document Revised: 02/29/2016 Document Reviewed: 04/12/2015 Elsevier Interactive Patient Education  2017 [employer].

## 2017-02-12 LAB — CBC WITH DIFFERENTIAL/PLATELET
BASOS PCT: 0 %
Basophils Absolute: 0 cells/uL (ref 0–200)
EOS ABS: 272 {cells}/uL (ref 15–500)
Eosinophils Relative: 2 %
HCT: 39.1 % (ref 35.0–45.0)
Hemoglobin: 13.3 g/dL (ref 11.7–15.5)
LYMPHS PCT: 9 %
Lymphs Abs: 1224 cells/uL (ref 850–3900)
MCH: 32.6 pg (ref 27.0–33.0)
MCHC: 34 g/dL (ref 32.0–36.0)
MCV: 95.8 fL (ref 80.0–100.0)
MONOS PCT: 3 %
MPV: 9.6 fL (ref 7.5–12.5)
Monocytes Absolute: 408 cells/uL (ref 200–950)
Neutro Abs: 11696 cells/uL — ABNORMAL HIGH (ref 1500–7800)
Neutrophils Relative %: 86 %
Platelets: 356 10*3/uL (ref 140–400)
RBC: 4.08 MIL/uL (ref 3.80–5.10)
RDW: 14 % (ref 11.0–15.0)
WBC: 13.6 10*3/uL — ABNORMAL HIGH (ref 3.8–10.8)

## 2017-02-12 LAB — TSH: TSH: 1.38 m[IU]/L

## 2017-02-12 LAB — COMPLETE METABOLIC PANEL WITH GFR
ALBUMIN: 4.1 g/dL (ref 3.6–5.1)
ALK PHOS: 65 U/L (ref 33–130)
ALT: 10 U/L (ref 6–29)
AST: 11 U/L (ref 10–35)
BILIRUBIN TOTAL: 0.9 mg/dL (ref 0.2–1.2)
BUN: 9 mg/dL (ref 7–25)
CALCIUM: 9.2 mg/dL (ref 8.6–10.4)
CO2: 28 mmol/L (ref 20–32)
CREATININE: 0.61 mg/dL (ref 0.50–0.99)
Chloride: 102 mmol/L (ref 98–110)
GFR, Est Non African American: >89 mL/min (ref >=60)
Glucose, Bld: 85 mg/dL (ref 65–99)
Potassium: 4.8 mmol/L (ref 3.5–5.3)
Sodium: 137 mmol/L (ref 135–146)
TOTAL PROTEIN: 6.3 g/dL (ref 6.1–8.1)

## 2017-02-12 LAB — LIPID PANEL
Cholesterol: 165 mg/dL (ref <200)
HDL: 31 mg/dL — AB (ref >50)
LDL Cholesterol: 111 mg/dL — ABNORMAL HIGH (ref <100)
Total CHOL/HDL Ratio: 5.3 Ratio — ABNORMAL HIGH (ref <5.0)
Triglycerides: 114 mg/dL (ref <150)
VLDL: 23 mg/dL (ref <30)

## 2017-02-13 LAB — VITAMIN B12: Vitamin B-12: 856 pg/mL (ref 200–1100)

## 2017-02-13 LAB — VITAMIN D 25 HYDROXY (VIT D DEFICIENCY, FRACTURES): Vit D, 25-Hydroxy: 28 ng/mL — ABNORMAL LOW (ref 30–100)

## 2017-02-15 ENCOUNTER — Ambulatory Visit
Admission: RE | Admit: 2017-02-15 | Discharge: 2017-02-15 | Disposition: A | Discharge status: Home / Self Care | Payer: Medicare Other | Source: Ambulatory Visit | Attending: Family Medicine | Admitting: Family Medicine

## 2017-02-15 ENCOUNTER — Ambulatory Visit (INDEPENDENT_AMBULATORY_CARE_PROVIDER_SITE_OTHER): Payer: Medicare Other | Admitting: Family Medicine

## 2017-02-15 ENCOUNTER — Encounter: Payer: Self-pay | Admitting: Family Medicine

## 2017-02-15 ENCOUNTER — Telehealth: Payer: Self-pay

## 2017-02-15 VITALS — BP 136/90 | HR 88 | Temp 98.4°F | Resp 16 | Ht 60.0 in | Wt 118.5 lb

## 2017-02-15 DIAGNOSIS — R630 Anorexia: Secondary | ICD-10-CM | POA: Diagnosis not present

## 2017-02-15 DIAGNOSIS — R05 Cough: Secondary | ICD-10-CM | POA: Insufficient documentation

## 2017-02-15 DIAGNOSIS — R058 Other specified cough: Secondary | ICD-10-CM

## 2017-02-15 DIAGNOSIS — D72829 Elevated white blood cell count, unspecified: Secondary | ICD-10-CM

## 2017-02-15 DIAGNOSIS — R5383 Other fatigue: Secondary | ICD-10-CM

## 2017-02-15 DIAGNOSIS — T50Z95A Adverse effect of other vaccines and biological substances, initial encounter: Secondary | ICD-10-CM | POA: Diagnosis not present

## 2017-02-15 MED ORDER — AZITHROMYCIN 250 MG PO TABS: ORAL_TABLET | ORAL | 0 refills | Status: DC

## 2017-02-15 NOTE — Telephone Encounter (Signed)
Patient is coming in to be evaluated, please schedule an appointment for her at 1 p.m.

## 2017-02-15 NOTE — Telephone Encounter (Signed)
WBC elevated and she is immunocompromised, sending a Z-pack but would prefer if she went to [facility] now , she could have sepsis. Very dangerous

## 2017-02-15 NOTE — Telephone Encounter (Signed)
Patient called regarding her lab results and states she has not heard from them. She feels horrible and all she has the strength to do is couch surf. Patient states Dr. Ancil Boozer was concerned about her Thyroid being out of wack and she wanted to know if this was the problem. Could you please review her labs and I will notify patient of results. Thanks

## 2017-02-15 NOTE — Progress Notes (Signed)
Name: Renee Cowan   MRN: [id number]    DOB: 1945/10/18   Date:02/15/2017       Progress Note  Subjective  Chief Complaint  Chief Complaint  Patient presents with  . Cough    patient stated that she does not really have a cough anymore. moreso intermittent  . Fatigue    patient stated that she is light-head, flushed and feels like she has a fever. very achy. not sure if it is a fibromyagia flare   . Abnormal Lab    elevated wbc count  . Other    patient stated that she had no real appetite  . Trauma    patient was stung by a wasp on 02/11/17  . Medication Reaction    patient has an area of concern where she received her shingles injection was given    HPI  Cough/chills/lack of appetite:   she went on vacation to [place] and had an URI two weeks ago, she states she was seen two days ago and still had a cough and some wheezing, but no SOB. She is still feeling very tired, lost almost 6 lbs, she had some loose stools initially,  but no bowel movements for about 5 days ago. TSH is normal, WBC is elevated, cough is now dry, but still has a lot of fatigue and lack of appetite.  Wasp sting: doing well, on lower back, mild itching.  Shingrex reaction: had shingles vaccine 2 days ago and right arm is red and slightly tender.     Patient Active Problem List   Diagnosis Date Noted  . Type 2 diabetes mellitus with microalbuminuria, without long-term current use of insulin ([place]) 11/28/2015  . Benign essential HTN 05/26/2015  . Depression, major, in remission ([place]) 05/26/2015  . Dyslipidemia 05/26/2015  . Type 2 diabetes mellitus with renal manifestations, controlled ([place]) 05/26/2015  . Fibromyalgia syndrome 05/26/2015  . Personal history of malignant melanoma of skin 05/26/2015  . Adult hypothyroidism 05/26/2015  . Osteoporosis, post-menopausal 05/26/2015  . Tobacco use 05/26/2015  . Vitamin D deficiency 05/26/2015  . Rheumatoid arthritis ([place]) 05/26/2015  . Acquired trigger finger  10/11/2007    Past Surgical History:  Procedure Laterality Date  . CATARACT EXTRACTION, BILATERAL Bilateral [id number] and 07/0202017  . CHOLECYSTECTOMY    . CYST REMOVAL TRUNK    . melaoma excesion    . trigger thumb release      Family History  Problem Relation Age of Onset  . Cancer Mother        cervical  . Tuberculosis Father     Social History   Social History  . Marital status: Married    Spouse name: N/A  . Number of children: N/A  . Years of education: N/A   Occupational History  . Not on file.   Social History Main Topics  . Smoking status: Current Every Day Smoker    Packs/day: 0.50    Years: 40.00    Types: Cigarettes    Start date: 12/03/1976  . Smokeless tobacco: Never Used  . Alcohol use 0.0 oz/week     Comment: occasionally  . Drug use: No  . Sexual activity: No   Other Topics Concern  . Not on file   Social History Narrative  . No narrative on file     Current Outpatient Prescriptions:  .  aspirin (ECOTRIN LOW STRENGTH) 81 MG EC tablet, ECOTRIN LOW STRENGTH, 81MG (Oral Tablet Delayed Release)  1 po qday for 0 days  Quantity: 30.00;  Refills: 0   Ordered :17-Aug-2010  Steele Sizer MD;  Started 20-Sep-2008 Active, Disp: , Rfl:  .  azithromycin (ZITHROMAX) 250 MG tablet, Take as directed, Disp: 6 tablet, Rfl: 0 .  Calcium Carb-Cholecalciferol (CALCIUM + D3) 600-200 MG-UNIT TABS, Take by mouth., Disp: , Rfl:  .  cyanocobalamin 500 MCG tablet, Take 500 mcg by mouth daily., Disp: , Rfl:  .  cyclobenzaprine (FLEXERIL) 10 MG tablet, Take 1 tablet (10 mg total) by mouth 3 (three) times daily as needed for muscle spasms., Disp: 30 tablet, Rfl: 0 .  folic acid (FOLVITE) 1 MG tablet, Take 1 mg by mouth daily., Disp: , Rfl: 1 .  levothyroxine (SYNTHROID, LEVOTHROID) 50 MCG tablet, TAKE 1 TABLET BY MOUTH  DAILY BEFORE BREAKFAST, Disp: 90 tablet, Rfl: 1 .  Magnesium 400 MG CAPS, Take 1 capsule by mouth daily., Disp: 30 capsule, Rfl: 0 .  meloxicam (MOBIC)  15 MG tablet, Take 1 tablet (15 mg total) by mouth daily. (Patient taking differently: Take 15 mg by mouth as needed. ), Disp: 90 tablet, Rfl: 1 .  metFORMIN (GLUCOPHAGE) 500 MG tablet, Take 1 tablet (500 mg total) by mouth daily with breakfast., Disp: 90 tablet, Rfl: 1 .  methotrexate (RHEUMATREX) 2.5 MG tablet, once a week. , Disp: , Rfl: 0  Allergies  Allergen Reactions  . Penicillins Rash  . Sulfa Antibiotics Rash     ROS  Ten systems reviewed and is negative except as mentioned in HPI    Objective  Vitals:   02/15/17 1246  BP: 136/90  Pulse: 88  Resp: 16  Temp: 98.4 F (36.9 C)  TempSrc: Oral  SpO2: 97%  Weight: 118 lb 8 oz (53.8 kg)  Height: 5' (1.524 m)    Body mass index is 23.14 kg/m.  Physical Exam  Constitutional: Patient appears well-developed and well-nourished.  No distress.  HEENT: head atraumatic, normocephalic, pupils equal and reactive to light, ears normal TM, neck supple, throat within normal limits Cardiovascular: Normal rate, regular rhythm and normal heart sounds.  No murmur heard. No BLE edema. Pulmonary/Chest: Effort normal and breath sounds normal. No respiratory distress. Abdominal: Soft.  There is no tenderness. Psychiatric: Patient has a normal mood and affect. behavior is normal. Judgment and thought content normal. Skin: erythematous rash on right arm, from shingrex  Recent Results (from the past 2160 hour(s))  POCT HgB A1C     Status: Normal   Collection Time: 12/03/16 10:11 AM  Result Value Ref Range   Hemoglobin A1C 5.3   POCT UA - Microalbumin     Status: Normal   Collection Time: 12/03/16 10:11 AM  Result Value Ref Range   Microalbumin Ur, POC 20 mg/L   Creatinine, POC  mg/dL   Albumin/Creatinine Ratio, Urine, POC    Vitamin B12     Status: None   Collection Time: 02/11/17  8:07 AM  Result Value Ref Range   Vitamin B-12 856 200 - 1,100 pg/mL  VITAMIN D 25 Hydroxy (Vit-D Deficiency, Fractures)     Status: Abnormal    Collection Time: 02/11/17  8:07 AM  Result Value Ref Range   Vit D, 25-Hydroxy 28 (L) 30 - 100 ng/mL    Comment: Vitamin D Status           25-OH Vitamin D        Deficiency                <20 ng/mL  Insufficiency         20 - 29 ng/mL        Optimal             > or = 30 ng/mL   For 25-OH Vitamin D testing on patients on D2-supplementation and patients for whom quantitation of D2 and D3 fractions is required, the Quest[facility]ure[facility] 25-OH VIT D, (D2,D3), LC/MS/MS is recommended: order code [id number] (patients > 2 yrs).   TSH     Status: None   Collection Time: 02/11/17  8:07 AM  Result Value Ref Range   TSH 1.38 mIU/L    Comment:   Reference Range   > or = 20 Years  0.40-4.50   Pregnancy Range First trimester  0.26-2.66 Second trimester 0.55-2.73 Third trimester  0.43-2.91     COMPLETE METABOLIC PANEL WITH GFR     Status: None   Collection Time: 02/11/17  8:07 AM  Result Value Ref Range   Sodium 137 135 - 146 mmol/L   Potassium 4.8 3.5 - 5.3 mmol/L   Chloride 102 98 - 110 mmol/L   CO2 28 20 - 32 mmol/L    Comment: ** Please note change in reference range(s). **      Glucose, Bld 85 65 - 99 mg/dL   BUN 9 7 - 25 mg/dL   Creat 0.61 0.50 - 0.99 mg/dL    Comment:   For patients > or = 69 years of age: The upper reference limit for Creatinine is approximately 13% higher for people identified as African-American.      Total Bilirubin 0.9 0.2 - 1.2 mg/dL   Alkaline Phosphatase 65 33 - 130 U/L   AST 11 10 - 35 U/L   ALT 10 6 - 29 U/L   Total Protein 6.3 6.1 - 8.1 g/dL   Albumin 4.1 3.6 - 5.1 g/dL   Calcium 9.2 8.6 - 10.4 mg/dL   GFR, Est African American >89 >=60 mL/min   GFR, Est Non African American >89 >=60 mL/min  CBC with Differential/Platelet     Status: Abnormal   Collection Time: 02/11/17  8:07 AM  Result Value Ref Range   WBC 13.6 (H) 3.8 - 10.8 K/uL   RBC 4.08 3.80 - 5.10 MIL/uL   Hemoglobin 13.3 11.7 - 15.5 g/dL   HCT 39.1 35.0 - 45.0 %   MCV 95.8 80.0 -  100.0 fL   MCH 32.6 27.0 - 33.0 pg   MCHC 34.0 32.0 - 36.0 g/dL   RDW 14.0 11.0 - 15.0 %   Platelets 356 140 - 400 K/uL   MPV 9.6 7.5 - 12.5 fL   Neutro Abs 11,696 (H) 1,500 - 7,800 cells/uL   Lymphs Abs 1,224 850 - 3,900 cells/uL   Monocytes Absolute 408 200 - 950 cells/uL   Eosinophils Absolute 272 15 - 500 cells/uL   Basophils Absolute 0 0 - 200 cells/uL   Neutrophils Relative % 86 %   Lymphocytes Relative 9 %   Monocytes Relative 3 %   Eosinophils Relative 2 %   Basophils Relative 0 %   Smear Review Criteria for review not met   Lipid panel     Status: Abnormal   Collection Time: 02/11/17  8:07 AM  Result Value Ref Range   Cholesterol 165 <200 mg/dL   Triglycerides 114 <150 mg/dL   HDL 31 (L) >50 mg/dL   Total CHOL/HDL Ratio 5.3 (H) <5.0 Ratio   VLDL 23 <30 mg/dL   LDL Cholesterol 111 (  H) <100 mg/dL      PHQ2/9: Depression screen [facility] 2/9 02/15/2017 02/11/2017 12/03/2016 05/29/2016 01/17/2016  Decreased Interest 1 1 0 0 0  Down, Depressed, Hopeless 1 1 0 0 0  PHQ - 2 Score 2 2 0 0 0  Altered sleeping 0 0 - - 0  Tired, decreased energy 1 1 - - 0  Change in appetite 1 1 - - 0  Feeling bad or failure about yourself  0 0 - - 0  Trouble concentrating 1 1 - - 0  Moving slowly or fidgety/restless 0 0 - - 0  Suicidal thoughts 0 0 - - 0  PHQ-9 Score 5 5 - - 0  Difficult doing work/chores Somewhat difficult Somewhat difficult - - -     Fall Risk: Fall Risk  02/15/2017 02/11/2017 12/03/2016 05/29/2016 01/17/2016  Falls in the past year? Yes Yes Yes No -  Comment - - - - -  Number falls in past yr: 2 or more 2 or more 1 - 1  Injury with Fall? No No No - No  Risk for fall due to : - - - - History of fall(s)  Follow up - - - - -     Functional Status Survey: Is the patient deaf or have difficulty hearing?: No Does the patient have difficulty seeing, even when wearing glasses/contacts?: No Does the patient have difficulty concentrating, remembering, or making decisions?:  No Does the patient have difficulty walking or climbing stairs?: No Does the patient have difficulty dressing or bathing?: No Does the patient have difficulty doing errands alone such as visiting a doctor's office or shopping?: No    Assessment & Plan  1. Leukocytosis, unspecified type  - Sedimentation rate - C-reactive protein - Urine Culture  2. Other fatigue  Follow up next week   3. Adverse effect of vaccine, initial encounter  Localized reaction, we will monitor  4. Lack of appetite  Advised to continue to have protein shakes if unable to eat food  5. Dry cough  She will get prescription of Zpack and CXR results pending, we will start medication since she is immunosuppressed

## 2017-02-16 LAB — SEDIMENTATION RATE: Sed Rate: 20 mm/hr (ref 0–30)

## 2017-02-18 ENCOUNTER — Ambulatory Visit (INDEPENDENT_AMBULATORY_CARE_PROVIDER_SITE_OTHER): Payer: Medicare Other | Admitting: Family Medicine

## 2017-02-18 ENCOUNTER — Encounter: Payer: Self-pay | Admitting: Family Medicine

## 2017-02-18 ENCOUNTER — Other Ambulatory Visit
Admission: RE | Admit: 2017-02-18 | Discharge: 2017-02-18 | Disposition: A | Discharge status: Home / Self Care | Payer: Medicare Other | Source: Ambulatory Visit | Attending: Family Medicine | Admitting: Family Medicine

## 2017-02-18 VITALS — BP 128/78 | HR 101 | Temp 98.7°F | Resp 16 | Ht 60.0 in | Wt 119.1 lb

## 2017-02-18 DIAGNOSIS — B9620 Unspecified Escherichia coli [E. coli] as the cause of diseases classified elsewhere: Secondary | ICD-10-CM

## 2017-02-18 DIAGNOSIS — N390 Urinary tract infection, site not specified: Secondary | ICD-10-CM | POA: Diagnosis not present

## 2017-02-18 DIAGNOSIS — D72829 Elevated white blood cell count, unspecified: Secondary | ICD-10-CM | POA: Diagnosis not present

## 2017-02-18 LAB — CBC
HEMATOCRIT: 37.6 % (ref 35.0–47.0)
HEMOGLOBIN: 13 g/dL (ref 12.0–16.0)
MCH: 32.5 pg (ref 26.0–34.0)
MCHC: 34.6 g/dL (ref 32.0–36.0)
MCV: 94.1 fL (ref 80.0–100.0)
Platelets: 365 10*3/uL (ref 150–440)
RBC: 3.99 MIL/uL (ref 3.80–5.20)
RDW: 14 % (ref 11.5–14.5)
WBC: 8.3 10*3/uL (ref 3.6–11.0)

## 2017-02-18 LAB — URINE CULTURE

## 2017-02-18 LAB — C-REACTIVE PROTEIN: CRP: 2.8 mg/L (ref <8.0)

## 2017-02-18 LAB — CREATININE, SERUM
Creatinine, Ser: 0.58 mg/dL (ref 0.44–1.00)
GFR calc Af Amer: >60 mL/min (ref >60)
GFR calc non Af Amer: >60 mL/min (ref >60)

## 2017-02-18 MED ORDER — CIPROFLOXACIN HCL 250 MG PO TABS: 250.0000 mg | ORAL_TABLET | Freq: Two times a day (BID) | ORAL | 0 refills | Status: DC

## 2017-02-18 NOTE — Progress Notes (Signed)
Name: Renee Cowan   MRN: [id number]    DOB: 02-21-1946   Date:02/18/2017       Progress Note  Subjective  Chief Complaint  Chief Complaint  Patient presents with  . leukocytosis    pt stated that she does not feel any better at all still fatigued and no energy and drained    HPI  Leukocytosis/positive urine culture: she was started on Z-pack on Friday, CXR was negative, today urine culture positive. Still very tired, having chills, lack of appetite, no nausea/vomiting or diarrhea. She has RA and takes immunosuppressant agents. Since she is getting worse, we will recheck CBC now and if higher we will try a direct admission to rule out sepsis. She is allergic to sulfa and PNC.   Patient Active Problem List   Diagnosis Date Noted  . Type 2 diabetes mellitus with microalbuminuria, without long-term current use of insulin ([place]) 11/28/2015  . Benign essential HTN 05/26/2015  . Depression, major, in remission ([place]) 05/26/2015  . Dyslipidemia 05/26/2015  . Type 2 diabetes mellitus with renal manifestations, controlled ([place]) 05/26/2015  . Fibromyalgia syndrome 05/26/2015  . Personal history of malignant melanoma of skin 05/26/2015  . Adult hypothyroidism 05/26/2015  . Osteoporosis, post-menopausal 05/26/2015  . Tobacco use 05/26/2015  . Vitamin D deficiency 05/26/2015  . Rheumatoid arthritis ([place]) 05/26/2015  . Acquired trigger finger 10/11/2007    Past Surgical History:  Procedure Laterality Date  . CATARACT EXTRACTION, BILATERAL Bilateral 27517001 and 07/0202017  . CHOLECYSTECTOMY    . CYST REMOVAL TRUNK    . melaoma excesion    . trigger thumb release      Family History  Problem Relation Age of Onset  . Cancer Mother        cervical  . Tuberculosis Father     Social History   Social History  . Marital status: Married    Spouse name: N/A  . Number of children: N/A  . Years of education: N/A   Occupational History  . Not on file.   Social History Main Topics   . Smoking status: Current Every Day Smoker    Packs/day: 0.50    Years: 40.00    Types: Cigarettes    Start date: 12/03/1976  . Smokeless tobacco: Never Used  . Alcohol use 0.0 oz/week     Comment: occasionally  . Drug use: No  . Sexual activity: No   Other Topics Concern  . Not on file   Social History Narrative  . No narrative on file     Current Outpatient Prescriptions:  .  aspirin (ECOTRIN LOW STRENGTH) 81 MG EC tablet, ECOTRIN LOW STRENGTH, 81MG (Oral Tablet Delayed Release)  1 po qday for 0 days  Quantity: 30.00;  Refills: 0   Ordered :17-Aug-2010  Steele Sizer MD;  Started 20-Sep-2008 Active, Disp: , Rfl:  .  azithromycin (ZITHROMAX) 250 MG tablet, Take as directed, Disp: 6 tablet, Rfl: 0 .  Calcium Carb-Cholecalciferol (CALCIUM + D3) 600-200 MG-UNIT TABS, Take by mouth., Disp: , Rfl:  .  cyanocobalamin 500 MCG tablet, Take 500 mcg by mouth daily., Disp: , Rfl:  .  cyclobenzaprine (FLEXERIL) 10 MG tablet, Take 1 tablet (10 mg total) by mouth 3 (three) times daily as needed for muscle spasms., Disp: 30 tablet, Rfl: 0 .  folic acid (FOLVITE) 1 MG tablet, Take 1 mg by mouth daily., Disp: , Rfl: 1 .  levothyroxine (SYNTHROID, LEVOTHROID) 50 MCG tablet, TAKE 1 TABLET BY MOUTH  DAILY BEFORE  BREAKFAST, Disp: 90 tablet, Rfl: 1 .  Magnesium 400 MG CAPS, Take 1 capsule by mouth daily., Disp: 30 capsule, Rfl: 0 .  meloxicam (MOBIC) 15 MG tablet, Take 1 tablet (15 mg total) by mouth daily. (Patient taking differently: Take 15 mg by mouth as needed. ), Disp: 90 tablet, Rfl: 1 .  metFORMIN (GLUCOPHAGE) 500 MG tablet, Take 1 tablet (500 mg total) by mouth daily with breakfast., Disp: 90 tablet, Rfl: 1 .  methotrexate (RHEUMATREX) 2.5 MG tablet, once a week. , Disp: , Rfl: 0 .  ciprofloxacin (CIPRO) 250 MG tablet, Take 1 tablet (250 mg total) by mouth 2 (two) times daily., Disp: 10 tablet, Rfl: 0  Allergies  Allergen Reactions  . Penicillins Rash  . Sulfa Antibiotics Rash      ROS  Ten systems reviewed and is negative except as mentioned in HPI   Objective  Vitals:   02/18/17 0947  BP: 128/78  Pulse: (!) 101  Resp: 16  Temp: 98.7 F (37.1 C)  SpO2: 97%  Weight: 119 lb 2 oz (54 kg)  Height: 5' (1.524 m)    Body mass index is 23.27 kg/m.  Physical Exam  Constitutional: Patient appears well-developed and well-nourished.  No distress.  She looks tired and drained HEENT: head atraumatic Cardiovascular: Normal rate, regular rhythm and normal heart sounds.  No murmur heard. No BLE edema. Pulmonary/Chest: Effort normal and breath sounds normal. No respiratory distress. Abdominal: Soft.  There is mild lower abdominal pain  Tenderness. Negative CVA tenderness Psychiatric: Patient has a normal mood and affect. behavior is normal. Judgment and thought content normal.   Recent Results (from the past 2160 hour(s))  POCT HgB A1C     Status: Normal   Collection Time: 12/03/16 10:11 AM  Result Value Ref Range   Hemoglobin A1C 5.3   POCT UA - Microalbumin     Status: Normal   Collection Time: 12/03/16 10:11 AM  Result Value Ref Range   Microalbumin Ur, POC 20 mg/L   Creatinine, POC  mg/dL   Albumin/Creatinine Ratio, Urine, POC    Vitamin B12     Status: None   Collection Time: 02/11/17  8:07 AM  Result Value Ref Range   Vitamin B-12 856 200 - 1,100 pg/mL  VITAMIN D 25 Hydroxy (Vit-D Deficiency, Fractures)     Status: Abnormal   Collection Time: 02/11/17  8:07 AM  Result Value Ref Range   Vit D, 25-Hydroxy 28 (L) 30 - 100 ng/mL    Comment: Vitamin D Status           25-OH Vitamin D        Deficiency                <20 ng/mL        Insufficiency         20 - 29 ng/mL        Optimal             > or = 30 ng/mL   For 25-OH Vitamin D testing on patients on D2-supplementation and patients for whom quantitation of D2 and D3 fractions is required, the [facility]D 25-OH VIT D, (D2,D3), LC/MS/MS is recommended: order code [id number] (patients > 2 yrs).    TSH     Status: None   Collection Time: 02/11/17  8:07 AM  Result Value Ref Range   TSH 1.38 mIU/L    Comment:   Reference Range   > or = 20 Years  0.40-4.50   Pregnancy Range First trimester  0.26-2.66 Second trimester 0.55-2.73 Third trimester  0.43-2.91     COMPLETE METABOLIC PANEL WITH GFR     Status: None   Collection Time: 02/11/17  8:07 AM  Result Value Ref Range   Sodium 137 135 - 146 mmol/L   Potassium 4.8 3.5 - 5.3 mmol/L   Chloride 102 98 - 110 mmol/L   CO2 28 20 - 32 mmol/L    Comment: ** Please note change in reference range(s). **      Glucose, Bld 85 65 - 99 mg/dL   BUN 9 7 - 25 mg/dL   Creat 0.61 0.50 - 0.99 mg/dL    Comment:   For patients > or = 69 years of age: The upper reference limit for Creatinine is approximately 13% higher for people identified as African-American.      Total Bilirubin 0.9 0.2 - 1.2 mg/dL   Alkaline Phosphatase 65 33 - 130 U/L   AST 11 10 - 35 U/L   ALT 10 6 - 29 U/L   Total Protein 6.3 6.1 - 8.1 g/dL   Albumin 4.1 3.6 - 5.1 g/dL   Calcium 9.2 8.6 - 10.4 mg/dL   GFR, Est African American >89 >=60 mL/min   GFR, Est Non African American >89 >=60 mL/min  CBC with Differential/Platelet     Status: Abnormal   Collection Time: 02/11/17  8:07 AM  Result Value Ref Range   WBC 13.6 (H) 3.8 - 10.8 K/uL   RBC 4.08 3.80 - 5.10 MIL/uL   Hemoglobin 13.3 11.7 - 15.5 g/dL   HCT 39.1 35.0 - 45.0 %   MCV 95.8 80.0 - 100.0 fL   MCH 32.6 27.0 - 33.0 pg   MCHC 34.0 32.0 - 36.0 g/dL   RDW 14.0 11.0 - 15.0 %   Platelets 356 140 - 400 K/uL   MPV 9.6 7.5 - 12.5 fL   Neutro Abs 11,696 (H) 1,500 - 7,800 cells/uL   Lymphs Abs 1,224 850 - 3,900 cells/uL   Monocytes Absolute 408 200 - 950 cells/uL   Eosinophils Absolute 272 15 - 500 cells/uL   Basophils Absolute 0 0 - 200 cells/uL   Neutrophils Relative % 86 %   Lymphocytes Relative 9 %   Monocytes Relative 3 %   Eosinophils Relative 2 %   Basophils Relative 0 %   Smear Review Criteria  for review not met   Lipid panel     Status: Abnormal   Collection Time: 02/11/17  8:07 AM  Result Value Ref Range   Cholesterol 165 <200 mg/dL   Triglycerides 114 <150 mg/dL   HDL 31 (L) >50 mg/dL   Total CHOL/HDL Ratio 5.3 (H) <5.0 Ratio   VLDL 23 <30 mg/dL   LDL Cholesterol 111 (H) <100 mg/dL  Sedimentation rate     Status: None   Collection Time: 02/15/17  1:48 PM  Result Value Ref Range   Sed Rate 20 0 - 30 mm/hr  C-reactive protein     Status: None (Preliminary result)   Collection Time: 02/15/17  1:48 PM  Result Value Ref Range   CRP  <8.0 mg/L  Urine Culture     Status: None   Collection Time: 02/15/17  1:56 PM  Result Value Ref Range   Culture ESCHERICHIA COLI     Comment: SOURCE: URINE&URINE   Colony Count 10,000-50,000 CFU/mL    Organism ID, Bacteria ESCHERICHIA COLI       Susceptibility  Escherichia coli -  (no method available)    AMPICILLIN  Resistant     AMOX/CLAVULANIC 4 Sensitive     AMPICILLIN/SULBACTAM 4 Sensitive     PIP/TAZO <=4 Sensitive     IMIPENEM <=0.25 Sensitive     CEFAZOLIN <=4 Not Reportable     CEFTRIAXONE <=1 Sensitive     CEFTAZIDIME <=1 Sensitive     CEFEPIME <=1 Sensitive     GENTAMICIN <=1 Sensitive     TOBRAMYCIN <=1 Sensitive     CIPROFLOXACIN <=0.25 Sensitive     LEVOFLOXACIN <=0.12 Sensitive     NITROFURANTOIN <=16 Sensitive     TRIMETH/SULFA* <=20 Sensitive      * NR=NOT REPORTABLE,SEE COMMENTORAL therapy:A cefazolin MIC of <32 predicts susceptibility to the oral agents cefaclor,cefdinir,cefpodoxime,cefprozil,cefuroxime,cephalexin,and loracarbef when used for therapy of uncomplicated UTIs due to E.coli,K.pneumomiae,and P.mirabilis. PARENTERAL therapy: A cefazolinMIC of >8 indicates resistance to parenteralcefazolin. An alternate test method must beperformed to confirm susceptibility to parenteralcefazolin.      PHQ2/9: Depression screen [facility] 2/9 02/15/2017 02/11/2017 12/03/2016 05/29/2016 01/17/2016  Decreased Interest 1 1 0 0 0   Down, Depressed, Hopeless 1 1 0 0 0  PHQ - 2 Score 2 2 0 0 0  Altered sleeping 0 0 - - 0  Tired, decreased energy 1 1 - - 0  Change in appetite 1 1 - - 0  Feeling bad or failure about yourself  0 0 - - 0  Trouble concentrating 1 1 - - 0  Moving slowly or fidgety/restless 0 0 - - 0  Suicidal thoughts 0 0 - - 0  PHQ-9 Score 5 5 - - 0  Difficult doing work/chores Somewhat difficult Somewhat difficult - - -     Fall Risk: Fall Risk  02/15/2017 02/11/2017 12/03/2016 05/29/2016 01/17/2016  Falls in the past year? Yes Yes Yes No -  Comment - - - - -  Number falls in past yr: 2 or more 2 or more 1 - 1  Injury with Fall? No No No - No  Risk for fall due to : - - - - History of fall(s)  Follow up - - - - -      Assessment & Plan  1. Leukocytosis, unspecified type  - ciprofloxacin (CIPRO) 250 MG tablet; Take 1 tablet (250 mg total) by mouth 2 (two) times daily.  Dispense: 10 tablet; Refill: 0 - CBC -C- Reactive protein  2. E-coli UTI  - ciprofloxacin (CIPRO) 250 MG tablet; Take 1 tablet (250 mg total) by mouth 2 (two) times daily.  Dispense: 10 tablet; Refill: 0

## 2017-02-22 ENCOUNTER — Encounter: Payer: Self-pay | Admitting: Family Medicine

## 2017-03-03 NOTE — Progress Notes (Signed)
03/04/2017 4:10 PM   Renee Cowan June 04, 1946 [id number]  Referring provider: Steele Sizer, MD [address] [place], [place] [address]  Chief Complaint  Patient presents with  . New Patient (Initial Visit)    Mixed Incontinence referred by Dr. Ancil Boozer    HPI: Patient is a 71 -year-old Caucasian female who is referred to [place] by, Dr. Ancil Boozer, for urinary incontinence.  Patient states that she has had urinary incontinence for years.  She had seen Dr. Kathyrn Lass in the past.  She was placed on Myrbetriq and underwent UDS.   UDS demonstrated a small capacity bladder and leaking with Valsalva.  Patient has incontinence with stress incontinence and urge incontinence.   She is not sure how many incontinent episodes she is experiencing during the day. She is not experiencing incontinent episodes during the night.  Her incontinence volume is moderate.   She is wearing 2 pads/depends daily.    She is having associated urinary frequency, urgency and nocturia.  She does not have a history of urinary tract infections, STI's or injury to the bladder.  She denies dysuria, gross hematuria, suprapubic pain, back pain, abdominal pain or flank pain.  She has not had any recent fevers, chills, nausea or vomiting.   She does not have a history of nephrolithiasis, GU surgery or GU trauma.   She is not sexually active.  She cannot even pleasure herself.  "It is gross."     She is post menopausal.   She denies constipation and/or diarrhea.   She is drinking 32 ounces daily  of water daily.   She is drinking one quart caffeinated beverages daily.  She is not drinking alcoholic beverages daily.    Her PVR is 51 mL.    Her risk factors for incontinence are obesity, vaginal delivery, a family history of incontinence, age, smoking, caffeine, diabetes,  Depression, vaginal atrophy and pelvic surgery.    She is taking skeletal muscle relaxants.   PMH: Past Medical History:  Diagnosis Date    . Allergy   . Cataract   . Concussion   . Depression   . Fibromyalgia   . Hyperlipidemia   . Hypertension   . Hypothyroidism   . Osteoporosis   . Rheumatoid arteritis   . Trigger finger   . Vitamin D deficiency     Surgical History: Past Surgical History:  Procedure Laterality Date  . CATARACT EXTRACTION, BILATERAL Bilateral [id number] and 07/0202017  . CHOLECYSTECTOMY    . CYST REMOVAL TRUNK    . melaoma excesion    . trigger thumb release      Home Medications:  Allergies as of 03/04/2017      Reactions   Penicillins Rash   Sulfa Antibiotics Rash      Medication List       Accurate as of 03/04/17  4:10 PM. Always use your most recent med list.          azithromycin 250 MG tablet Commonly known as:  ZITHROMAX Take as directed   Calcium + D3 600-200 MG-UNIT Tabs Take by mouth.   ciprofloxacin 250 MG tablet Commonly known as:  CIPRO Take 1 tablet (250 mg total) by mouth 2 (two) times daily.   cyanocobalamin 500 MCG tablet Take 500 mcg by mouth daily.   cyclobenzaprine 10 MG tablet Commonly known as:  FLEXERIL Take 1 tablet (10 mg total) by mouth 3 (three) times daily as needed for muscle spasms.   ECOTRIN LOW STRENGTH 81  MG EC tablet Generic drug:  aspirin ECOTRIN LOW STRENGTH, 81MG  (Oral Tablet Delayed Release)  1 po qday for 0 days  Quantity: 30.00;  Refills: 0   Ordered :17-Aug-2010  Steele Sizer MD;  Buddy Duty [id number] Active   folic acid 1 MG tablet Commonly known as:  FOLVITE Take 1 mg by mouth daily.   levothyroxine 50 MCG tablet Commonly known as:  SYNTHROID, LEVOTHROID TAKE 1 TABLET BY MOUTH  DAILY BEFORE BREAKFAST   Magnesium 400 MG Caps Take 1 capsule by mouth daily.   meloxicam 15 MG tablet Commonly known as:  MOBIC Take 1 tablet (15 mg total) by mouth daily.   metFORMIN 500 MG tablet Commonly known as:  GLUCOPHAGE Take 1 tablet (500 mg total) by mouth daily with breakfast.   methotrexate 2.5 MG tablet Commonly known as:   RHEUMATREX once a week.            Discharge Care Instructions        Start     Ordered   03/04/17 0000  BLADDER SCAN AMB NON-IMAGING     03/04/17 1445      Allergies:  Allergies  Allergen Reactions  . Penicillins Rash  . Sulfa Antibiotics Rash    Family History: Family History  Problem Relation Age of Onset  . Cancer Mother        cervical  . Tuberculosis Father   . Kidney cancer Neg Hx   . Bladder Cancer Neg Hx     Social History:  reports that she has been smoking Cigarettes.  She started smoking about 40 years ago. She has a 20.00 pack-year smoking history. She has never used smokeless tobacco. She reports that she drinks alcohol. She reports that she does not use drugs.  ROS: UROLOGY Frequent Urination?: Yes Hard to postpone urination?: Yes Burning/pain with urination?: No Get up at night to urinate?: Yes Leakage of urine?: Yes Urine stream starts and stops?: No Trouble starting stream?: No Do you have to strain to urinate?: No Blood in urine?: No Urinary tract infection?: No Sexually transmitted disease?: No Injury to kidneys or bladder?: No Painful intercourse?: No Weak stream?: No Currently pregnant?: No Vaginal bleeding?: No Last menstrual period?: n  Gastrointestinal Nausea?: No Vomiting?: No Indigestion/heartburn?: No Diarrhea?: No Constipation?: No  Constitutional Fever: Yes Night sweats?: No Weight loss?: Yes Fatigue?: Yes  Skin Skin rash/lesions?: No Itching?: No  Eyes Blurred vision?: No Double vision?: No  Ears/Nose/Throat Sore throat?: No Sinus problems?: No  Hematologic/Lymphatic Swollen glands?: No Easy bruising?: No  Cardiovascular Leg swelling?: No Chest pain?: No  Respiratory Cough?: No Shortness of breath?: No  Endocrine Excessive thirst?: No  Musculoskeletal Back pain?: No Joint pain?: No  Neurological Headaches?: No Dizziness?: No  Psychologic Depression?: No Anxiety?: No  Physical  Exam: BP (!) 141/95   Pulse 90   Temp 98.7 F (37.1 C) (Oral)   Ht 5' (1.524 m)   Wt 118 lb 6.4 oz (53.7 kg)   BMI 23.12 kg/m   Constitutional: Well nourished. Alert and oriented, No acute distress. HEENT: [place] AT, moist mucus membranes. Trachea midline, no masses. Cardiovascular: No clubbing, cyanosis, or edema. Respiratory: Normal respiratory effort, no increased work of breathing. GI: Abdomen is soft, non tender, non distended, no abdominal masses. Liver and spleen not palpable.  No hernias appreciated.  Stool sample for occult testing is not indicated.   GU: No CVA tenderness.  No bladder fullness or masses.  Atrophic external genitalia, normal pubic hair distribution,  no lesions.  Normal urethral meatus, no lesions, no prolapse, no discharge.   No urethral masses, tenderness and/or tenderness. No bladder fullness, tenderness or masses.  Urethral hypermobility with Valsalva and leakage.   Pale vagina mucosa, poor estrogen effect, no discharge, no lesions, good pelvic support, no cystocele or rectocele noted.  No cervical motion tenderness.  Uterus is freely mobile and non-fixed.  No adnexal/parametria masses or tenderness noted.  Anus and perineum are without rashes or lesions.    Skin: No rashes, bruises or suspicious lesions. Lymph: No cervical or inguinal adenopathy. Neurologic: Grossly intact, no focal deficits, moving all 4 extremities. Psychiatric: Normal mood and affect.  Laboratory Data: Lab Results  Component Value Date   WBC 8.3 02/18/2017   HGB 13.0 02/18/2017   HCT 37.6 02/18/2017   MCV 94.1 02/18/2017   PLT 365 02/18/2017    Lab Results  Component Value Date   CREATININE 0.58 02/18/2017    Lab Results  Component Value Date   HGBA1C 5.3 12/03/2016    Lab Results  Component Value Date   TSH 1.38 02/11/2017       Component Value Date/Time   CHOL 165 02/11/2017 0807   CHOL 205 (H) 11/24/2015 0936   HDL 31 (L) 02/11/2017 0807   HDL 33 (L) 11/24/2015 0936     CHOLHDL 5.3 (H) 02/11/2017 0807   VLDL 23 02/11/2017 0807   LDLCALC 111 (H) 02/11/2017 0807   LDLCALC 132 (H) 11/24/2015 0936    Lab Results  Component Value Date   AST 11 02/11/2017   Lab Results  Component Value Date   ALT 10 02/11/2017    I have reviewed the labs. Results for DOLORAS, TELLADO (MRN [id number]) as of 03/04/2017 15:49  Ref. Range 03/04/2017 14:57  Scan Result Unknown 51    Pertinent Imaging:  I have independently reviewed the films.    Assessment & Plan:    1. Mixed Incontinence  - offered behavioral therapies, bladder training, bladder control strategies and pelvic floor muscle training - refer to PT  - fluid management - decrease tea consumption  - offered medical therapy with anticholinergic therapy or beta-3 adrenergic receptor agonist and the potential side effects of each therapy - patient does not want to take medications due to the side effects  - RTC for an appointment with Dr. Matilde Sprang  2. Vaginal atrophy  - Patient was given a script for vaginal estrogen cream (Premarin) and instructed to apply 0.5mg  (pea-sized amount)  just inside the vaginal introitus with a finger-tip every night for two weeks and then Monday, Wednesday and Friday nights.  I explained to the patient that vaginally administered estrogen, which causes only a slight increase in the blood estrogen levels, have fewer contraindications and adverse systemic effects that oral HT.    Return for Appointment with Dr. Matilde Sprang.  These notes generated with voice recognition software. I apologize for typographical errors.  Zara Council, [facility] [address], [place], [place] [address] [phone]

## 2017-03-04 ENCOUNTER — Encounter: Payer: Self-pay | Admitting: Family Medicine

## 2017-03-04 ENCOUNTER — Ambulatory Visit (INDEPENDENT_AMBULATORY_CARE_PROVIDER_SITE_OTHER): Payer: Medicare Other | Admitting: Urology

## 2017-03-04 ENCOUNTER — Encounter: Payer: Self-pay | Admitting: Urology

## 2017-03-04 VITALS — BP 141/95 | HR 90 | Temp 98.7°F | Ht 60.0 in | Wt 118.4 lb

## 2017-03-04 DIAGNOSIS — N3946 Mixed incontinence: Secondary | ICD-10-CM | POA: Diagnosis not present

## 2017-03-04 DIAGNOSIS — N952 Postmenopausal atrophic vaginitis: Secondary | ICD-10-CM | POA: Diagnosis not present

## 2017-03-04 LAB — BLADDER SCAN AMB NON-IMAGING: Scan Result: 51

## 2017-03-04 MED ORDER — ESTROGENS, CONJUGATED 0.625 MG/GM VA CREA: 1.0000 | TOPICAL_CREAM | Freq: Every day | VAGINAL | 12 refills | Status: DC

## 2017-03-05 ENCOUNTER — Ambulatory Visit (INDEPENDENT_AMBULATORY_CARE_PROVIDER_SITE_OTHER): Payer: Medicare Other | Admitting: Family Medicine

## 2017-03-05 ENCOUNTER — Encounter: Payer: Self-pay | Admitting: Family Medicine

## 2017-03-05 VITALS — BP 146/98 | HR 100 | Temp 98.6°F | Resp 18 | Ht 60.0 in | Wt 119.2 lb

## 2017-03-05 DIAGNOSIS — B9620 Unspecified Escherichia coli [E. coli] as the cause of diseases classified elsewhere: Secondary | ICD-10-CM | POA: Diagnosis not present

## 2017-03-05 DIAGNOSIS — R030 Elevated blood-pressure reading, without diagnosis of hypertension: Secondary | ICD-10-CM

## 2017-03-05 DIAGNOSIS — E1121 Type 2 diabetes mellitus with diabetic nephropathy: Secondary | ICD-10-CM

## 2017-03-05 DIAGNOSIS — N390 Urinary tract infection, site not specified: Secondary | ICD-10-CM | POA: Diagnosis not present

## 2017-03-05 LAB — GLUCOSE, POCT (MANUAL RESULT ENTRY): POC GLUCOSE: 127 mg/dL — AB (ref 70–99)

## 2017-03-05 LAB — COLOGUARD: Cologuard: POSITIVE

## 2017-03-05 NOTE — Progress Notes (Signed)
Name: Renee Cowan   MRN: [id number]    DOB: Oct 21, 1945   Date:03/05/2017       Progress Note  Subjective  Chief Complaint  Chief Complaint  Patient presents with  . leukocytosis    patient finished antibiotics last Friday still very weak, foggy headed, no appetite, off balance     HPI  UTI follow up and fatigue: she was treated for UTI, still feeling very tired, lack of appetite, feels dizzy at times, she is still taking Metformin, but states getting shaky when she is due next meal. WBC normalized, C-reactive protein was normal. Treated with antibiotics, but not back to normal  She has some indigestion but no change in bowel movements or blood in stools. We will try stop Metformin and monitor glucose at home, we will check urine culture today  Patient Active Problem List   Diagnosis Date Noted  . Type 2 diabetes mellitus with microalbuminuria, without long-term current use of insulin ([place]) 11/28/2015  . Benign essential HTN 05/26/2015  . Depression, major, in remission ([place]) 05/26/2015  . Dyslipidemia 05/26/2015  . Type 2 diabetes mellitus with renal manifestations, controlled ([place]) 05/26/2015  . Fibromyalgia syndrome 05/26/2015  . Personal history of malignant melanoma of skin 05/26/2015  . Adult hypothyroidism 05/26/2015  . Osteoporosis, post-menopausal 05/26/2015  . Tobacco use 05/26/2015  . Vitamin D deficiency 05/26/2015  . Rheumatoid arthritis ([place]) 05/26/2015  . Acquired trigger finger 10/11/2007    Past Surgical History:  Procedure Laterality Date  . CATARACT EXTRACTION, BILATERAL Bilateral [id number] and 07/0202017  . CHOLECYSTECTOMY    . CYST REMOVAL TRUNK    . melaoma excesion    . trigger thumb release      Family History  Problem Relation Age of Onset  . Cancer Mother        cervical  . Tuberculosis Father   . Kidney cancer Neg Hx   . Bladder Cancer Neg Hx     Social History   Social History  . Marital status: Married    Spouse name: N/A  .  Number of children: N/A  . Years of education: N/A   Occupational History  . Not on file.   Social History Main Topics  . Smoking status: Current Every Day Smoker    Packs/day: 0.50    Years: 40.00    Types: Cigarettes    Start date: 12/03/1976  . Smokeless tobacco: Never Used  . Alcohol use 0.0 oz/week     Comment: occasionally  . Drug use: No  . Sexual activity: No   Other Topics Concern  . Not on file   Social History Narrative  . No narrative on file     Current Outpatient Prescriptions:  .  aspirin (ECOTRIN LOW STRENGTH) 81 MG EC tablet, ECOTRIN LOW STRENGTH, 81MG (Oral Tablet Delayed Release)  1 po qday for 0 days  Quantity: 30.00;  Refills: 0   Ordered :17-Aug-2010  Steele Sizer MD;  Buddy Duty 20-Sep-2008 Active, Disp: , Rfl:  .  Calcium Carb-Cholecalciferol (CALCIUM + D3) 600-200 MG-UNIT TABS, Take by mouth., Disp: , Rfl:  .  conjugated estrogens (PREMARIN) vaginal cream, Place 1 Applicatorful vaginally daily. Apply 0.80m (pea-sized amount)  just inside the vaginal introitus with a finger-tip every night for two weeks and then Monday, Wednesday and Friday nights., Disp: 30 g, Rfl: 12 .  cyanocobalamin 500 MCG tablet, Take 500 mcg by mouth daily., Disp: , Rfl:  .  cyclobenzaprine (FLEXERIL) 10 MG tablet, Take 1 tablet (10  mg total) by mouth 3 (three) times daily as needed for muscle spasms., Disp: 30 tablet, Rfl: 0 .  folic acid (FOLVITE) 1 MG tablet, Take 1 mg by mouth daily., Disp: , Rfl: 1 .  levothyroxine (SYNTHROID, LEVOTHROID) 50 MCG tablet, TAKE 1 TABLET BY MOUTH  DAILY BEFORE BREAKFAST, Disp: 90 tablet, Rfl: 1 .  Magnesium 400 MG CAPS, Take 1 capsule by mouth daily., Disp: 30 capsule, Rfl: 0 .  meloxicam (MOBIC) 15 MG tablet, Take 1 tablet (15 mg total) by mouth daily. (Patient taking differently: Take 15 mg by mouth as needed. ), Disp: 90 tablet, Rfl: 1 .  methotrexate (RHEUMATREX) 2.5 MG tablet, once a week. , Disp: , Rfl: 0  Allergies  Allergen Reactions  .  Penicillins Rash  . Sulfa Antibiotics Rash     ROS  Constitutional: Negative for fever or weight change.  Respiratory: Negative for cough and shortness of breath.   Cardiovascular: Negative for chest pain or palpitations.  Gastrointestinal: Positive for abdominal discomfort and bloating after meals, no bowel changes.  Musculoskeletal: Negative for gait problem or joint swelling.  Skin: Negative for rash.  Neurological: Negative for dizziness or headache.  No other specific complaints in a complete review of systems (except as listed in HPI above).  Objective  Vitals:   03/05/17 0856  BP: (!) 146/98  Pulse: 100  Resp: 18  Temp: 98.6 F (37 C)  TempSrc: Oral  SpO2: 97%  Weight: 119 lb 3.2 oz (54.1 kg)  Height: 5' (1.524 m)    Body mass index is 23.28 kg/m.  Physical Exam  Constitutional: Patient appears well-developed and well-nourished. No distress.  HEENT: head atraumatic, normocephalic, pupils equal and reactive to light,  neck supple, throat within normal limits Cardiovascular: Normal rate, regular rhythm and normal heart sounds.  No murmur heard. No BLE edema. Pulmonary/Chest: Effort normal and breath sounds normal. No respiratory distress. Abdominal: Soft.  There is normal bowel sounds, positive tenderness on lower abdomen  Psychiatric: Patient has a normal mood and affect. behavior is normal. Judgment and thought content normal.  Recent Results (from the past 2160 hour(s))  Vitamin B12     Status: None   Collection Time: 02/11/17  8:07 AM  Result Value Ref Range   Vitamin B-12 856 200 - 1,100 pg/mL  VITAMIN D 25 Hydroxy (Vit-D Deficiency, Fractures)     Status: Abnormal   Collection Time: 02/11/17  8:07 AM  Result Value Ref Range   Vit D, 25-Hydroxy 28 (L) 30 - 100 ng/mL    Comment: Vitamin D Status           25-OH Vitamin D        Deficiency                <20 ng/mL        Insufficiency         20 - 29 ng/mL        Optimal             > or = 30 ng/mL    For 25-OH Vitamin D testing on patients on D2-supplementation and patients for whom quantitation of D2 and D3 fractions is required, the QuestAssureD 25-OH VIT D, (D2,D3), LC/MS/MS is recommended: order code [phone] (patients > 2 yrs).   TSH     Status: None   Collection Time: 02/11/17  8:07 AM  Result Value Ref Range   TSH 1.38 mIU/L    Comment:   Reference Range   >  or = 20 Years  0.40-4.50   Pregnancy Range First trimester  0.26-2.66 Second trimester 0.55-2.73 Third trimester  0.43-2.91     COMPLETE METABOLIC PANEL WITH GFR     Status: None   Collection Time: 02/11/17  8:07 AM  Result Value Ref Range   Sodium 137 135 - 146 mmol/L   Potassium 4.8 3.5 - 5.3 mmol/L   Chloride 102 98 - 110 mmol/L   CO2 28 20 - 32 mmol/L    Comment: ** Please note change in reference range(s). **      Glucose, Bld 85 65 - 99 mg/dL   BUN 9 7 - 25 mg/dL   Creat 0.61 0.50 - 0.99 mg/dL    Comment:   For patients > or = 69 years of age: The upper reference limit for Creatinine is approximately 13% higher for people identified as African-American.      Total Bilirubin 0.9 0.2 - 1.2 mg/dL   Alkaline Phosphatase 65 33 - 130 U/L   AST 11 10 - 35 U/L   ALT 10 6 - 29 U/L   Total Protein 6.3 6.1 - 8.1 g/dL   Albumin 4.1 3.6 - 5.1 g/dL   Calcium 9.2 8.6 - 10.4 mg/dL   GFR, Est African American >89 >=60 mL/min   GFR, Est Non African American >89 >=60 mL/min  CBC with Differential/Platelet     Status: Abnormal   Collection Time: 02/11/17  8:07 AM  Result Value Ref Range   WBC 13.6 (H) 3.8 - 10.8 K/uL   RBC 4.08 3.80 - 5.10 MIL/uL   Hemoglobin 13.3 11.7 - 15.5 g/dL   HCT 39.1 35.0 - 45.0 %   MCV 95.8 80.0 - 100.0 fL   MCH 32.6 27.0 - 33.0 pg   MCHC 34.0 32.0 - 36.0 g/dL   RDW 14.0 11.0 - 15.0 %   Platelets 356 140 - 400 K/uL   MPV 9.6 7.5 - 12.5 fL   Neutro Abs 11,696 (H) 1,500 - 7,800 cells/uL   Lymphs Abs 1,224 850 - 3,900 cells/uL   Monocytes Absolute 408 200 - 950 cells/uL    Eosinophils Absolute 272 15 - 500 cells/uL   Basophils Absolute 0 0 - 200 cells/uL   Neutrophils Relative % 86 %   Lymphocytes Relative 9 %   Monocytes Relative 3 %   Eosinophils Relative 2 %   Basophils Relative 0 %   Smear Review Criteria for review not met   Lipid panel     Status: Abnormal   Collection Time: 02/11/17  8:07 AM  Result Value Ref Range   Cholesterol 165 <200 mg/dL   Triglycerides 114 <150 mg/dL   HDL 31 (L) >50 mg/dL   Total CHOL/HDL Ratio 5.3 (H) <5.0 Ratio   VLDL 23 <30 mg/dL   LDL Cholesterol 111 (H) <100 mg/dL  Sedimentation rate     Status: None   Collection Time: 02/15/17  1:48 PM  Result Value Ref Range   Sed Rate 20 0 - 30 mm/hr  C-reactive protein     Status: None   Collection Time: 02/15/17  1:48 PM  Result Value Ref Range   CRP 2.8 <8.0 mg/L  Urine Culture     Status: None   Collection Time: 02/15/17  1:56 PM  Result Value Ref Range   Culture ESCHERICHIA COLI     Comment: SOURCE: URINE&URINE   Colony Count 10,000-50,000 CFU/mL    Organism ID, Bacteria ESCHERICHIA COLI  Susceptibility   Escherichia coli -  (no method available)    AMPICILLIN  Resistant     AMOX/CLAVULANIC 4 Sensitive     AMPICILLIN/SULBACTAM 4 Sensitive     PIP/TAZO <=4 Sensitive     IMIPENEM <=0.25 Sensitive     CEFAZOLIN <=4 Not Reportable     CEFTRIAXONE <=1 Sensitive     CEFTAZIDIME <=1 Sensitive     CEFEPIME <=1 Sensitive     GENTAMICIN <=1 Sensitive     TOBRAMYCIN <=1 Sensitive     CIPROFLOXACIN <=0.25 Sensitive     LEVOFLOXACIN <=0.12 Sensitive     NITROFURANTOIN <=16 Sensitive     TRIMETH/SULFA* <=20 Sensitive      * NR=NOT REPORTABLE,SEE COMMENTORAL therapy:A cefazolin MIC of <32 predicts susceptibility to the oral agents cefaclor,cefdinir,cefpodoxime,cefprozil,cefuroxime,cephalexin,and loracarbef when used for therapy of uncomplicated UTIs due to E.coli,K.pneumomiae,and P.mirabilis. PARENTERAL therapy: A cefazolinMIC of >8 indicates resistance to  parenteralcefazolin. An alternate test method must beperformed to confirm susceptibility to parenteralcefazolin.  CBC     Status: None   Collection Time: 02/18/17 11:23 AM  Result Value Ref Range   WBC 8.3 3.6 - 11.0 K/uL   RBC 3.99 3.80 - 5.20 MIL/uL   Hemoglobin 13.0 12.0 - 16.0 g/dL   HCT 37.6 35.0 - 47.0 %   MCV 94.1 80.0 - 100.0 fL   MCH 32.5 26.0 - 34.0 pg   MCHC 34.6 32.0 - 36.0 g/dL   RDW 14.0 11.5 - 14.5 %   Platelets 365 150 - 440 K/uL  Creatinine, serum     Status: None   Collection Time: 02/18/17 11:23 AM  Result Value Ref Range   Creatinine, Ser 0.58 0.44 - 1.00 mg/dL   GFR calc non Af Amer >60 >60 mL/min   GFR calc Af Amer >60 >60 mL/min    Comment: (NOTE) The eGFR has been calculated using the CKD EPI equation. This calculation has not been validated in all clinical situations. eGFR's persistently <60 mL/min signify possible Chronic Kidney Disease.   BLADDER SCAN AMB NON-IMAGING     Status: None   Collection Time: 03/04/17  2:57 PM  Result Value Ref Range   Scan Result 51   POCT Glucose (CBG)     Status: Abnormal   Collection Time: 03/05/17  9:47 AM  Result Value Ref Range   POC Glucose 127 (A) 70 - 99 mg/dl    Comment: post prandial      PHQ2/9: Depression screen [facility] 2/9 02/15/2017 02/11/2017 12/03/2016 05/29/2016 01/17/2016  Decreased Interest 1 1 0 0 0  Down, Depressed, Hopeless 1 1 0 0 0  PHQ - 2 Score 2 2 0 0 0  Altered sleeping 0 0 - - 0  Tired, decreased energy 1 1 - - 0  Change in appetite 1 1 - - 0  Feeling bad or failure about yourself  0 0 - - 0  Trouble concentrating 1 1 - - 0  Moving slowly or fidgety/restless 0 0 - - 0  Suicidal thoughts 0 0 - - 0  PHQ-9 Score 5 5 - - 0  Difficult doing work/chores Somewhat difficult Somewhat difficult - - -     Fall Risk: Fall Risk  02/15/2017 02/11/2017 12/03/2016 05/29/2016 01/17/2016  Falls in the past year? Yes Yes Yes No -  Comment - - - - -  Number falls in past yr: 2 or more 2 or more 1 - 1   Injury with Fall? No No No - No  Risk  for fall due to : - - - - History of fall(s)  Follow up - - - - -     Assessment & Plan  1. Controlled type 2 diabetes mellitus with diabetic nephropathy, without long-term current use of insulin (HCC)  - POCT Glucose (CBG) Stop Metformin for now and we will give another machine to monitor glucose at home  2. E. coli UTI  - Urine Culture  3. Elevated blood pressure reading  She will monitor, we will recheck in one month

## 2017-03-06 LAB — URINE CULTURE
MICRO NUMBER:: 80999743
Result:: NO GROWTH
SPECIMEN QUALITY:: ADEQUATE

## 2017-03-08 ENCOUNTER — Encounter: Payer: Self-pay | Admitting: Family Medicine

## 2017-03-09 ENCOUNTER — Encounter: Payer: Self-pay | Admitting: Family Medicine

## 2017-03-11 ENCOUNTER — Encounter: Payer: Self-pay | Admitting: Family Medicine

## 2017-03-11 ENCOUNTER — Telehealth: Payer: Self-pay

## 2017-03-11 ENCOUNTER — Other Ambulatory Visit: Payer: Self-pay

## 2017-03-11 NOTE — Telephone Encounter (Signed)
Your insurance will not pay for another cologuard, you can try 3 hemoccult cards yearly, but the guidelines recommends to follow with a colonoscopy

## 2017-03-11 NOTE — Telephone Encounter (Signed)
Patient notified about her positive cologuard result and wanted to know if her being sick could have contributed to this result. Patient states she took this test during the mist of being sick and taking antibiotics and would be glad to do another one before a colonoscopy.

## 2017-03-12 NOTE — Telephone Encounter (Signed)
Patient prefers to do the hemoccult cards instead and will pick these up during her next visit.

## 2017-03-16 ENCOUNTER — Encounter: Payer: Self-pay | Admitting: Family Medicine

## 2017-03-19 ENCOUNTER — Encounter: Payer: Self-pay | Admitting: Family Medicine

## 2017-03-20 ENCOUNTER — Encounter: Payer: Self-pay | Admitting: Family Medicine

## 2017-03-25 ENCOUNTER — Ambulatory Visit: Payer: Medicare Other | Admitting: Urology

## 2017-03-25 ENCOUNTER — Ambulatory Visit (INDEPENDENT_AMBULATORY_CARE_PROVIDER_SITE_OTHER): Payer: Medicare Other | Admitting: Family Medicine

## 2017-03-25 ENCOUNTER — Encounter: Payer: Self-pay | Admitting: Urology

## 2017-03-25 ENCOUNTER — Encounter: Payer: Self-pay | Admitting: Family Medicine

## 2017-03-25 VITALS — BP 150/90 | HR 101 | Resp 14 | Wt 116.0 lb

## 2017-03-25 DIAGNOSIS — F331 Major depressive disorder, recurrent, moderate: Secondary | ICD-10-CM

## 2017-03-25 DIAGNOSIS — N390 Urinary tract infection, site not specified: Secondary | ICD-10-CM

## 2017-03-25 DIAGNOSIS — H9312 Tinnitus, left ear: Secondary | ICD-10-CM | POA: Diagnosis not present

## 2017-03-25 DIAGNOSIS — M810 Age-related osteoporosis without current pathological fracture: Secondary | ICD-10-CM | POA: Diagnosis not present

## 2017-03-25 DIAGNOSIS — R195 Other fecal abnormalities: Secondary | ICD-10-CM | POA: Diagnosis not present

## 2017-03-25 DIAGNOSIS — M797 Fibromyalgia: Secondary | ICD-10-CM

## 2017-03-25 DIAGNOSIS — R2689 Other abnormalities of gait and mobility: Secondary | ICD-10-CM | POA: Diagnosis not present

## 2017-03-25 DIAGNOSIS — Z780 Asymptomatic menopausal state: Secondary | ICD-10-CM

## 2017-03-25 MED ORDER — DULOXETINE HCL 30 MG PO CPEP: 30.0000 mg | ORAL_CAPSULE | Freq: Every day | ORAL | 0 refills | Status: DC

## 2017-03-25 NOTE — Progress Notes (Signed)
Name: Renee Cowan   MRN: [id number]    DOB: 03/01/1946   Date:03/25/2017       Progress Note  Subjective  Chief Complaint  Chief Complaint  Patient presents with  . Medication Dose Change    HPI  Depression: she came in today to discuss depression. She has a history of recurrent depression and has been very sick , tired, lack of motivation , change in appetite since she returned from vacation and had an URI. This past week she realized that symptoms are secondary to her depression. She had massage therapy, talked to a friend and is starting to feel better, however would like to resume anti-depressants. She denies suicidal thoughts or ideation.   Balanced problems: she has a history of vestibular dysfunction, seen by Dr. Tami Ribas last year and has chronic tinnitus and left hearing loss, she noticed balance problems over the past few days, no rhinorrhea or cold symptoms. Other symptoms unchanged. No other neuro deficit  HTN: bp is elevated today, but she would like to hold off on resuming other medications until antidepressant kicks in, she will follow up in 3-4 weeks  Patient Active Problem List   Diagnosis Date Noted  . Type 2 diabetes mellitus with microalbuminuria, without long-term current use of insulin ([place]) 11/28/2015  . Benign essential HTN 05/26/2015  . Depression, major, in remission ([place]) 05/26/2015  . Dyslipidemia 05/26/2015  . Type 2 diabetes mellitus with renal manifestations, controlled ([place]) 05/26/2015  . Fibromyalgia syndrome 05/26/2015  . Personal history of malignant melanoma of skin 05/26/2015  . Adult hypothyroidism 05/26/2015  . Osteoporosis, post-menopausal 05/26/2015  . Tobacco use 05/26/2015  . Vitamin D deficiency 05/26/2015  . Rheumatoid arthritis ([place]) 05/26/2015  . Acquired trigger finger 10/11/2007    Past Surgical History:  Procedure Laterality Date  . CATARACT EXTRACTION, BILATERAL Bilateral [id number] and [id number]  . CHOLECYSTECTOMY    . CYST  REMOVAL TRUNK    . melaoma excesion    . trigger thumb release      Family History  Problem Relation Age of Onset  . Cancer Mother        cervical  . Tuberculosis Father   . Kidney cancer Neg Hx   . Bladder Cancer Neg Hx     Social History   Social History  . Marital status: Married    Spouse name: N/A  . Number of children: N/A  . Years of education: N/A   Occupational History  . Not on file.   Social History Main Topics  . Smoking status: Current Every Day Smoker    Packs/day: 0.50    Years: 40.00    Types: Cigarettes    Start date: 12/03/1976  . Smokeless tobacco: Never Used  . Alcohol use 0.0 oz/week     Comment: occasionally  . Drug use: No  . Sexual activity: No   Other Topics Concern  . Not on file   Social History Narrative  . No narrative on file     Current Outpatient Prescriptions:  .  aspirin (ECOTRIN LOW STRENGTH) 81 MG EC tablet, ECOTRIN LOW STRENGTH, 81MG (Oral Tablet Delayed Release)  1 po qday for 0 days  Quantity: 30.00;  Refills: 0   Ordered :17-Aug-2010  Steele Sizer MD;  Buddy Duty 20-Sep-2008 Active, Disp: , Rfl:  .  Calcium Carb-Cholecalciferol (CALCIUM + D3) 600-200 MG-UNIT TABS, Take by mouth., Disp: , Rfl:  .  conjugated estrogens (PREMARIN) vaginal cream, Place 1 Applicatorful vaginally daily. Apply 0.9m (pea-sized amount)  just inside the vaginal introitus with a finger-tip every night for two weeks and then Monday, Wednesday and Friday nights., Disp: 30 g, Rfl: 12 .  cyanocobalamin 500 MCG tablet, Take 500 mcg by mouth daily., Disp: , Rfl:  .  cyclobenzaprine (FLEXERIL) 10 MG tablet, Take 1 tablet (10 mg total) by mouth 3 (three) times daily as needed for muscle spasms., Disp: 30 tablet, Rfl: 0 .  folic acid (FOLVITE) 1 MG tablet, Take 1 mg by mouth daily., Disp: , Rfl: 1 .  levothyroxine (SYNTHROID, LEVOTHROID) 50 MCG tablet, TAKE 1 TABLET BY MOUTH  DAILY BEFORE BREAKFAST, Disp: 90 tablet, Rfl: 1 .  Magnesium 400 MG CAPS, Take 1  capsule by mouth daily., Disp: 30 capsule, Rfl: 0 .  meloxicam (MOBIC) 15 MG tablet, Take 1 tablet (15 mg total) by mouth daily. (Patient taking differently: Take 15 mg by mouth as needed. ), Disp: 90 tablet, Rfl: 1 .  methotrexate (RHEUMATREX) 2.5 MG tablet, once a week. , Disp: , Rfl: 0  Allergies  Allergen Reactions  . Penicillins Rash  . Sulfa Antibiotics Rash     ROS  Constitutional: Negative for fever or weight change.  Respiratory: Negative for cough and shortness of breath.   Cardiovascular: Negative for chest pain or palpitations.  Gastrointestinal: Negative for abdominal pain, no bowel changes.  Musculoskeletal: Positiive  for gait problem but no  joint swelling.  Skin: Negative for rash.  Neurological: Positive  for dizziness and  headache.  No other specific complaints in a complete review of systems (except as listed in HPI above).  Objective  Vitals:   03/25/17 1002  BP: (!) 150/90  Pulse: (!) 101  Resp: 14  SpO2: 99%  Weight: 116 lb (52.6 kg)    Body mass index is 22.65 kg/m.  Physical Exam  Constitutional: Patient appears well-developed and well-nourished.  No distress.  HEENT: head atraumatic, normocephalic, pupils equal and reactive to light, neck supple, throat within normal limits Cardiovascular: Normal rate, regular rhythm and normal heart sounds.  No murmur heard. No BLE edema. Pulmonary/Chest: Effort normal and breath sounds normal. No respiratory distress. Abdominal: Soft.  There is no tenderness. Psychiatric: Patient has a normal mood and affect. behavior is normal. Judgment and thought content normal. Neurological: romberg negative, able to walk in a straight line  Recent Results (from the past 2160 hour(s))  Vitamin B12     Status: None   Collection Time: 02/11/17  8:07 AM  Result Value Ref Range   Vitamin B-12 856 200 - 1,100 pg/mL  VITAMIN D 25 Hydroxy (Vit-D Deficiency, Fractures)     Status: Abnormal   Collection Time: 02/11/17  8:07  AM  Result Value Ref Range   Vit D, 25-Hydroxy 28 (L) 30 - 100 ng/mL    Comment: Vitamin D Status           25-OH Vitamin D        Deficiency                <20 ng/mL        Insufficiency         20 - 29 ng/mL        Optimal             > or = 30 ng/mL   For 25-OH Vitamin D testing on patients on D2-supplementation and patients for whom quantitation of D2 and D3 fractions is required, the [facility] 25-OH VIT D, (D2,D3), LC/MS/MS is recommended: order code  85814 (patients > 2 yrs).   TSH     Status: None   Collection Time: 02/11/17  8:07 AM  Result Value Ref Range   TSH 1.38 mIU/L    Comment:   Reference Range   > or = 20 Years  0.40-4.50   Pregnancy Range First trimester  0.26-2.66 Second trimester 0.55-2.73 Third trimester  0.43-2.91     COMPLETE METABOLIC PANEL WITH GFR     Status: None   Collection Time: 02/11/17  8:07 AM  Result Value Ref Range   Sodium 137 135 - 146 mmol/L   Potassium 4.8 3.5 - 5.3 mmol/L   Chloride 102 98 - 110 mmol/L   CO2 28 20 - 32 mmol/L    Comment: ** Please note change in reference range(s). **      Glucose, Bld 85 65 - 99 mg/dL   BUN 9 7 - 25 mg/dL   Creat 0.61 0.50 - 0.99 mg/dL    Comment:   For patients > or = 69 years of age: The upper reference limit for Creatinine is approximately 13% higher for people identified as African-American.      Total Bilirubin 0.9 0.2 - 1.2 mg/dL   Alkaline Phosphatase 65 33 - 130 U/L   AST 11 10 - 35 U/L   ALT 10 6 - 29 U/L   Total Protein 6.3 6.1 - 8.1 g/dL   Albumin 4.1 3.6 - 5.1 g/dL   Calcium 9.2 8.6 - 10.4 mg/dL   GFR, Est African American >89 >=60 mL/min   GFR, Est Non African American >89 >=60 mL/min  CBC with Differential/Platelet     Status: Abnormal   Collection Time: 02/11/17  8:07 AM  Result Value Ref Range   WBC 13.6 (H) 3.8 - 10.8 K/uL   RBC 4.08 3.80 - 5.10 MIL/uL   Hemoglobin 13.3 11.7 - 15.5 g/dL   HCT 39.1 35.0 - 45.0 %   MCV 95.8 80.0 - 100.0 fL   MCH 32.6 27.0 - 33.0  pg   MCHC 34.0 32.0 - 36.0 g/dL   RDW 14.0 11.0 - 15.0 %   Platelets 356 140 - 400 K/uL   MPV 9.6 7.5 - 12.5 fL   Neutro Abs 11,696 (H) 1,500 - 7,800 cells/uL   Lymphs Abs 1,224 850 - 3,900 cells/uL   Monocytes Absolute 408 200 - 950 cells/uL   Eosinophils Absolute 272 15 - 500 cells/uL   Basophils Absolute 0 0 - 200 cells/uL   Neutrophils Relative % 86 %   Lymphocytes Relative 9 %   Monocytes Relative 3 %   Eosinophils Relative 2 %   Basophils Relative 0 %   Smear Review Criteria for review not met   Lipid panel     Status: Abnormal   Collection Time: 02/11/17  8:07 AM  Result Value Ref Range   Cholesterol 165 <200 mg/dL   Triglycerides 114 <150 mg/dL   HDL 31 (L) >50 mg/dL   Total CHOL/HDL Ratio 5.3 (H) <5.0 Ratio   VLDL 23 <30 mg/dL   LDL Cholesterol 111 (H) <100 mg/dL  Sedimentation rate     Status: None   Collection Time: 02/15/17  1:48 PM  Result Value Ref Range   Sed Rate 20 0 - 30 mm/hr  C-reactive protein     Status: None   Collection Time: 02/15/17  1:48 PM  Result Value Ref Range   CRP 2.8 <8.0 mg/L  Urine Culture     Status: None  Collection Time: 02/15/17  1:56 PM  Result Value Ref Range   Culture ESCHERICHIA COLI     Comment: SOURCE: URINE&URINE   Colony Count 10,000-50,000 CFU/mL    Organism ID, Bacteria ESCHERICHIA COLI       Susceptibility   Escherichia coli -  (no method available)    AMPICILLIN  Resistant     AMOX/CLAVULANIC 4 Sensitive     AMPICILLIN/SULBACTAM 4 Sensitive     PIP/TAZO <=4 Sensitive     IMIPENEM <=0.25 Sensitive     CEFAZOLIN <=4 Not Reportable     CEFTRIAXONE <=1 Sensitive     CEFTAZIDIME <=1 Sensitive     CEFEPIME <=1 Sensitive     GENTAMICIN <=1 Sensitive     TOBRAMYCIN <=1 Sensitive     CIPROFLOXACIN <=0.25 Sensitive     LEVOFLOXACIN <=0.12 Sensitive     NITROFURANTOIN <=16 Sensitive     TRIMETH/SULFA* <=20 Sensitive      * NR=NOT REPORTABLE,SEE COMMENTORAL therapy:A cefazolin MIC of <32 predicts susceptibility to the  oral agents cefaclor,cefdinir,cefpodoxime,cefprozil,cefuroxime,cephalexin,and loracarbef when used for therapy of uncomplicated UTIs due to E.coli,K.pneumomiae,and P.mirabilis. PARENTERAL therapy: A cefazolinMIC of >8 indicates resistance to parenteralcefazolin. An alternate test method must beperformed to confirm susceptibility to parenteralcefazolin.  CBC     Status: None   Collection Time: 02/18/17 11:23 AM  Result Value Ref Range   WBC 8.3 3.6 - 11.0 K/uL   RBC 3.99 3.80 - 5.20 MIL/uL   Hemoglobin 13.0 12.0 - 16.0 g/dL   HCT 37.6 35.0 - 47.0 %   MCV 94.1 80.0 - 100.0 fL   MCH 32.5 26.0 - 34.0 pg   MCHC 34.6 32.0 - 36.0 g/dL   RDW 14.0 11.5 - 14.5 %   Platelets 365 150 - 440 K/uL  Creatinine, serum     Status: None   Collection Time: 02/18/17 11:23 AM  Result Value Ref Range   Creatinine, Ser 0.58 0.44 - 1.00 mg/dL   GFR calc non Af Amer >60 >60 mL/min   GFR calc Af Amer >60 >60 mL/min    Comment: (NOTE) The eGFR has been calculated using the CKD EPI equation. This calculation has not been validated in all clinical situations. eGFR's persistently <60 mL/min signify possible Chronic Kidney Disease.   Cologuard     Status: None   Collection Time: 03/03/17 12:00 AM  Result Value Ref Range   Cologuard Positive     Comment: [employer]  BLADDER SCAN AMB NON-IMAGING     Status: None   Collection Time: 03/04/17  2:57 PM  Result Value Ref Range   Scan Result 51   POCT Glucose (CBG)     Status: Abnormal   Collection Time: 03/05/17  9:47 AM  Result Value Ref Range   POC Glucose 127 (A) 70 - 99 mg/dl    Comment: post prandial  Urine Culture     Status: None   Collection Time: 03/05/17 10:47 AM  Result Value Ref Range   MICRO NUMBER: [id number]    SPECIMEN QUALITY: ADEQUATE    Sample Source URINE, CLEAN CATCH    STATUS: FINAL    Result: No Growth     PHQ2/9: Depression screen [facility] 2/9 03/25/2017 02/15/2017 02/11/2017 12/03/2016 05/29/2016  Decreased Interest _0 0 0  Down,  Depressed, Hopeless _1 0 0  PHQ - 2 Score _2 0 0  Altered sleeping 3 0 0 - -  Tired, decreased energy _3 - -  Change in appetite _0 - -  Feeling bad or failure about yourself  0 0 0 - -  Trouble concentrating _1 - -  Moving slowly or fidgety/restless 0 0 0 - -  Suicidal thoughts 0 0 0 - -  PHQ-9 Score _2 - -  Difficult doing work/chores Extremely dIfficult Somewhat difficult Somewhat difficult - -     Fall Risk: Fall Risk  03/25/2017 02/15/2017 02/11/2017 12/03/2016 05/29/2016  Falls in the past year? No Yes Yes Yes No  Comment - - - - -  Number falls in past yr: - 2 or more 2 or more 1 -  Injury with Fall? - No No No -  Risk for fall due to : - - - - -  Follow up - - - - -      Assessment & Plan  1. Moderate recurrent major depression (HCC)  - DULoxetine (CYMBALTA) 30 MG capsule; Take 1-2 capsules (30-60 mg total) by mouth daily.  Dispense: 60 capsule; Refill: 0  2. Fibromyalgia syndrome  - DULoxetine (CYMBALTA) 30 MG capsule; Take 1-2 capsules (30-60 mg total) by mouth daily.  Dispense: 60 capsule; Refill: 0  3. Impairment of balance  - Ambulatory referral to ENT  4. Left-sided tinnitus  - Ambulatory referral to ENT  5. Osteopenia after menopause  She wants to hold off on medication  6. Urinary tract infection without hematuria, site unspecified  - Urine Culture  7. Positive colorectal cancer screening using Cologuard test  - POC Hemoccult Bld/Stl (3-Cd Home Screen); Future

## 2017-03-26 LAB — URINE CULTURE
MICRO NUMBER: 81085663
RESULT: NO GROWTH
SPECIMEN QUALITY: ADEQUATE

## 2017-04-10 ENCOUNTER — Encounter: Payer: Self-pay | Admitting: Family Medicine

## 2017-04-18 ENCOUNTER — Ambulatory Visit: Payer: Medicare Other | Admitting: Physical Therapy

## 2017-04-21 ENCOUNTER — Other Ambulatory Visit: Payer: Self-pay | Admitting: Family Medicine

## 2017-04-21 DIAGNOSIS — F331 Major depressive disorder, recurrent, moderate: Secondary | ICD-10-CM

## 2017-04-21 DIAGNOSIS — M797 Fibromyalgia: Secondary | ICD-10-CM

## 2017-04-23 ENCOUNTER — Ambulatory Visit: Payer: Medicare Other | Admitting: Physical Therapy

## 2017-04-25 ENCOUNTER — Other Ambulatory Visit: Payer: Self-pay

## 2017-04-25 ENCOUNTER — Encounter: Payer: Self-pay | Admitting: Family Medicine

## 2017-04-25 ENCOUNTER — Ambulatory Visit (INDEPENDENT_AMBULATORY_CARE_PROVIDER_SITE_OTHER): Payer: Medicare Other | Admitting: Family Medicine

## 2017-04-25 VITALS — BP 130/74 | HR 102 | Temp 98.8°F | Resp 18 | Ht 60.0 in | Wt 113.0 lb

## 2017-04-25 DIAGNOSIS — F331 Major depressive disorder, recurrent, moderate: Secondary | ICD-10-CM

## 2017-04-25 DIAGNOSIS — R195 Other fecal abnormalities: Secondary | ICD-10-CM

## 2017-04-25 DIAGNOSIS — I10 Essential (primary) hypertension: Secondary | ICD-10-CM | POA: Diagnosis not present

## 2017-04-25 DIAGNOSIS — M797 Fibromyalgia: Secondary | ICD-10-CM | POA: Diagnosis not present

## 2017-04-25 DIAGNOSIS — R42 Dizziness and giddiness: Secondary | ICD-10-CM

## 2017-04-25 DIAGNOSIS — Z23 Encounter for immunization: Secondary | ICD-10-CM | POA: Diagnosis not present

## 2017-04-25 DIAGNOSIS — F411 Generalized anxiety disorder: Secondary | ICD-10-CM | POA: Diagnosis not present

## 2017-04-25 DIAGNOSIS — H903 Sensorineural hearing loss, bilateral: Secondary | ICD-10-CM | POA: Diagnosis not present

## 2017-04-25 MED ORDER — DULOXETINE HCL 60 MG PO CPEP: 60.0000 mg | ORAL_CAPSULE | Freq: Every day | ORAL | 1 refills | Status: DC

## 2017-04-25 NOTE — Progress Notes (Signed)
Name: Renee Cowan   MRN: [id number]    DOB: Aug 21, 1945   Date:04/25/2017       Progress Note  Subjective  Chief Complaint  Chief Complaint  Patient presents with  . Follow-up    1 month F/U  . Depression    Can't tell a difference with the increase of medication    HPI  Hypertension: she has been off HCTZ since July 2017 and is doing well. No longer drinking caffeine, not eating a lot of salt.Denies chest pain or palpitation. Doing well, bp is at goal today  Depression major /GAD: she is now on Cymbalta up to 60 mg , started 03/25/2017, she states dizziness started before change in medication. She feels depression mostly because of dizziness, she cannot take care of herself, go out, shopping and does not feel well, however Cymbalta seems to be helping with anxiety.   Dizziness: seeing ENT, doing home exercises, but not feeling better, mild sensorial hearing loss and tinnitus ( chronic). Refer to neurologist.   Changed in fine motor skills: difficulty with writing, difficulty knitting. Refer to neurologist, also difficulty with balance- leaning to right side.    Patient Active Problem List   Diagnosis Date Noted  . GAD (generalized anxiety disorder) 04/25/2017  . Moderate recurrent major depression ([place]) 04/25/2017  . Type 2 diabetes mellitus with microalbuminuria, without long-term current use of insulin ([place]) 11/28/2015  . Benign essential HTN 05/26/2015  . Depression, major, in remission ([place]) 05/26/2015  . Dyslipidemia 05/26/2015  . Type 2 diabetes mellitus with renal manifestations, controlled ([place]) 05/26/2015  . Fibromyalgia syndrome 05/26/2015  . Personal history of malignant melanoma of skin 05/26/2015  . Adult hypothyroidism 05/26/2015  . Osteopenia after menopause 05/26/2015  . Tobacco use 05/26/2015  . Vitamin D deficiency 05/26/2015  . Rheumatoid arthritis ([place]) 05/26/2015  . Acquired trigger finger 10/11/2007    Past Surgical History:  Procedure  Laterality Date  . CATARACT EXTRACTION, BILATERAL Bilateral [id number] and [id number]  . CHOLECYSTECTOMY    . CYST REMOVAL TRUNK    . melaoma excesion    . trigger thumb release      Family History  Problem Relation Age of Onset  . Cancer Mother        cervical  . Tuberculosis Father   . Kidney cancer Neg Hx   . Bladder Cancer Neg Hx     Social History   Social History  . Marital status: Married    Spouse name: N/A  . Number of children: N/A  . Years of education: N/A   Occupational History  . Not on file.   Social History Main Topics  . Smoking status: Current Every Day Smoker    Packs/day: 0.50    Years: 40.00    Types: Cigarettes    Start date: 12/03/1976  . Smokeless tobacco: Never Used  . Alcohol use 0.0 oz/week     Comment: occasionally  . Drug use: No  . Sexual activity: No   Other Topics Concern  . Not on file   Social History Narrative  . No narrative on file     Current Outpatient Prescriptions:  .  aspirin (ECOTRIN LOW STRENGTH) 81 MG EC tablet, ECOTRIN LOW STRENGTH, 81MG (Oral Tablet Delayed Release)  1 po qday for 0 days  Quantity: 30.00;  Refills: 0   Ordered :17-Aug-2010  Steele Sizer MD;  Buddy Duty 20-Sep-2008 Active, Disp: , Rfl:  .  Calcium Carb-Cholecalciferol (CALCIUM + D3) 600-200 MG-UNIT TABS, Take by mouth.,  Disp: , Rfl:  .  conjugated estrogens (PREMARIN) vaginal cream, Place 1 Applicatorful vaginally daily. Apply 0.49m (pea-sized amount)  just inside the vaginal introitus with a finger-tip every night for two weeks and then Monday, Wednesday and Friday nights., Disp: 30 g, Rfl: 12 .  cyanocobalamin 500 MCG tablet, Take 500 mcg by mouth daily., Disp: , Rfl:  .  cyclobenzaprine (FLEXERIL) 10 MG tablet, Take 1 tablet (10 mg total) by mouth 3 (three) times daily as needed for muscle spasms., Disp: 30 tablet, Rfl: 0 .  DULoxetine (CYMBALTA) 60 MG capsule, Take 1 capsule (60 mg total) by mouth daily., Disp: 90 capsule, Rfl: 1 .  folic acid  (FOLVITE) 1 MG tablet, Take 1 mg by mouth daily., Disp: , Rfl: 1 .  levothyroxine (SYNTHROID, LEVOTHROID) 50 MCG tablet, TAKE 1 TABLET BY MOUTH  DAILY BEFORE BREAKFAST, Disp: 90 tablet, Rfl: 1 .  Magnesium 400 MG CAPS, Take 1 capsule by mouth daily., Disp: 30 capsule, Rfl: 0 .  meloxicam (MOBIC) 15 MG tablet, Take 1 tablet (15 mg total) by mouth daily. (Patient taking differently: Take 15 mg by mouth as needed. ), Disp: 90 tablet, Rfl: 1 .  methotrexate (RHEUMATREX) 2.5 MG tablet, once a week. , Disp: , Rfl: 0  Allergies  Allergen Reactions  . Penicillins Rash  . Sulfa Antibiotics Rash     ROS  Constitutional: Negative for fever , positive for mild  weight change.  Respiratory: Negative for cough and shortness of breath.   Cardiovascular: Negative for chest pain or palpitations.  Gastrointestinal: Negative for abdominal pain, no bowel changes.  Musculoskeletal: Positive  for gait problem no  joint swelling.  Skin: Negative for rash.  Neurological:Positive for dizziness , no headache.  No other specific complaints in a complete review of systems (except as listed in HPI above).  Objective  Vitals:   04/25/17 0838  BP: 130/74  Pulse: (!) 102  Resp: 18  Temp: 98.8 F (37.1 C)  TempSrc: Oral  SpO2: 98%  Weight: 113 lb (51.3 kg)  Height: 5' (1.524 m)    Body mass index is 22.07 kg/m.  Physical Exam  Constitutional: Patient appears well-developed and well-nourished.  No distress.  HEENT: head atraumatic, normocephalic, pupils equal and reactive to light,  neck supple, throat within normal limits Cardiovascular: Normal rate, regular rhythm and normal heart sounds.  No murmur heard. No BLE edema. Pulmonary/Chest: Effort normal and breath sounds normal. No respiratory distress. Abdominal: Soft.  There is no tenderness. Psychiatric: Patient has a normal mood and affect. behavior is normal. Judgment and thought content normal. Neurology: normal cranial nerves, normal strength,  Romberg negative  Recent Results (from the past 2160 hour(s))  Vitamin B12     Status: None   Collection Time: 02/11/17  8:07 AM  Result Value Ref Range   Vitamin B-12 856 200 - 1,100 pg/mL  VITAMIN D 25 Hydroxy (Vit-D Deficiency, Fractures)     Status: Abnormal   Collection Time: 02/11/17  8:07 AM  Result Value Ref Range   Vit D, 25-Hydroxy 28 (L) 30 - 100 ng/mL    Comment: Vitamin D Status           25-OH Vitamin D        Deficiency                <20 ng/mL        Insufficiency         20 - 29 ng/mL  Optimal             > or = 30 ng/mL   For 25-OH Vitamin D testing on patients on D2-supplementation and patients for whom quantitation of D2 and D3 fractions is required, the [facility] 25-OH VIT D, (D2,D3), LC/MS/MS is recommended: order code [id number] (patients > 2 yrs).   TSH     Status: None   Collection Time: 02/11/17  8:07 AM  Result Value Ref Range   TSH 1.38 mIU/L    Comment:   Reference Range   > or = 20 Years  0.40-4.50   Pregnancy Range First trimester  0.26-2.66 Second trimester 0.55-2.73 Third trimester  0.43-2.91     COMPLETE METABOLIC PANEL WITH GFR     Status: None   Collection Time: 02/11/17  8:07 AM  Result Value Ref Range   Sodium 137 135 - 146 mmol/L   Potassium 4.8 3.5 - 5.3 mmol/L   Chloride 102 98 - 110 mmol/L   CO2 28 20 - 32 mmol/L    Comment: ** Please note change in reference range(s). **      Glucose, Bld 85 65 - 99 mg/dL   BUN 9 7 - 25 mg/dL   Creat 0.61 0.50 - 0.99 mg/dL    Comment:   For patients > or = 69 years of age: The upper reference limit for Creatinine is approximately 13% higher for people identified as African-American.      Total Bilirubin 0.9 0.2 - 1.2 mg/dL   Alkaline Phosphatase 65 33 - 130 U/L   AST 11 10 - 35 U/L   ALT 10 6 - 29 U/L   Total Protein 6.3 6.1 - 8.1 g/dL   Albumin 4.1 3.6 - 5.1 g/dL   Calcium 9.2 8.6 - 10.4 mg/dL   GFR, Est African American >89 >=60 mL/min   GFR, Est Non African American >89  >=60 mL/min  CBC with Differential/Platelet     Status: Abnormal   Collection Time: 02/11/17  8:07 AM  Result Value Ref Range   WBC 13.6 (H) 3.8 - 10.8 K/uL   RBC 4.08 3.80 - 5.10 MIL/uL   Hemoglobin 13.3 11.7 - 15.5 g/dL   HCT 39.1 35.0 - 45.0 %   MCV 95.8 80.0 - 100.0 fL   MCH 32.6 27.0 - 33.0 pg   MCHC 34.0 32.0 - 36.0 g/dL   RDW 14.0 11.0 - 15.0 %   Platelets 356 140 - 400 K/uL   MPV 9.6 7.5 - 12.5 fL   Neutro Abs 11,696 (H) 1,500 - 7,800 cells/uL   Lymphs Abs 1,224 850 - 3,900 cells/uL   Monocytes Absolute 408 200 - 950 cells/uL   Eosinophils Absolute 272 15 - 500 cells/uL   Basophils Absolute 0 0 - 200 cells/uL   Neutrophils Relative % 86 %   Lymphocytes Relative 9 %   Monocytes Relative 3 %   Eosinophils Relative 2 %   Basophils Relative 0 %   Smear Review Criteria for review not met   Lipid panel     Status: Abnormal   Collection Time: 02/11/17  8:07 AM  Result Value Ref Range   Cholesterol 165 <200 mg/dL   Triglycerides 114 <150 mg/dL   HDL 31 (L) >50 mg/dL   Total CHOL/HDL Ratio 5.3 (H) <5.0 Ratio   VLDL 23 <30 mg/dL   LDL Cholesterol 111 (H) <100 mg/dL  Sedimentation rate     Status: None   Collection Time: 02/15/17  1:48 PM  Result Value Ref Range   Sed Rate 20 0 - 30 mm/hr  C-reactive protein     Status: None   Collection Time: 02/15/17  1:48 PM  Result Value Ref Range   CRP 2.8 <8.0 mg/L  Urine Culture     Status: None   Collection Time: 02/15/17  1:56 PM  Result Value Ref Range   Culture ESCHERICHIA COLI     Comment: SOURCE: URINE&URINE   Colony Count 10,000-50,000 CFU/mL    Organism ID, Bacteria ESCHERICHIA COLI       Susceptibility   Escherichia coli -  (no method available)    AMPICILLIN  Resistant     AMOX/CLAVULANIC 4 Sensitive     AMPICILLIN/SULBACTAM 4 Sensitive     PIP/TAZO <=4 Sensitive     IMIPENEM <=0.25 Sensitive     CEFAZOLIN <=4 Not Reportable     CEFTRIAXONE <=1 Sensitive     CEFTAZIDIME <=1 Sensitive     CEFEPIME <=1 Sensitive      GENTAMICIN <=1 Sensitive     TOBRAMYCIN <=1 Sensitive     CIPROFLOXACIN <=0.25 Sensitive     LEVOFLOXACIN <=0.12 Sensitive     NITROFURANTOIN <=16 Sensitive     TRIMETH/SULFA* <=20 Sensitive      * NR=NOT REPORTABLE,SEE COMMENTORAL therapy:A cefazolin MIC of <32 predicts susceptibility to the oral agents cefaclor,cefdinir,cefpodoxime,cefprozil,cefuroxime,cephalexin,and loracarbef when used for therapy of uncomplicated UTIs due to E.coli,K.pneumomiae,and P.mirabilis. PARENTERAL therapy: A cefazolinMIC of >8 indicates resistance to parenteralcefazolin. An alternate test method must beperformed to confirm susceptibility to parenteralcefazolin.  CBC     Status: None   Collection Time: 02/18/17 11:23 AM  Result Value Ref Range   WBC 8.3 3.6 - 11.0 K/uL   RBC 3.99 3.80 - 5.20 MIL/uL   Hemoglobin 13.0 12.0 - 16.0 g/dL   HCT 37.6 35.0 - 47.0 %   MCV 94.1 80.0 - 100.0 fL   MCH 32.5 26.0 - 34.0 pg   MCHC 34.6 32.0 - 36.0 g/dL   RDW 14.0 11.5 - 14.5 %   Platelets 365 150 - 440 K/uL  Creatinine, serum     Status: None   Collection Time: 02/18/17 11:23 AM  Result Value Ref Range   Creatinine, Ser 0.58 0.44 - 1.00 mg/dL   GFR calc non Af Amer >60 >60 mL/min   GFR calc Af Amer >60 >60 mL/min    Comment: (NOTE) The eGFR has been calculated using the CKD EPI equation. This calculation has not been validated in all clinical situations. eGFR's persistently <60 mL/min signify possible Chronic Kidney Disease.   Cologuard     Status: None   Collection Time: 03/03/17 12:00 AM  Result Value Ref Range   Cologuard Positive     Comment: [employer]  BLADDER SCAN AMB NON-IMAGING     Status: None   Collection Time: 03/04/17  2:57 PM  Result Value Ref Range   Scan Result 51   POCT Glucose (CBG)     Status: Abnormal   Collection Time: 03/05/17  9:47 AM  Result Value Ref Range   POC Glucose 127 (A) 70 - 99 mg/dl    Comment: post prandial  Urine Culture     Status: None   Collection Time: 03/05/17  10:47 AM  Result Value Ref Range   MICRO NUMBER: [id number]    SPECIMEN QUALITY: ADEQUATE    Sample Source URINE, CLEAN CATCH    STATUS: FINAL    Result: No Growth   Urine Culture  Status: None   Collection Time: 03/25/17  1:57 PM  Result Value Ref Range   MICRO NUMBER: [id number]    SPECIMEN QUALITY: ADEQUATE    Sample Source URINE, CLEAN CATCH    STATUS: FINAL    Result: No Growth       PHQ2/9: Depression screen [facility] 2/9 04/25/2017 03/25/2017 02/15/2017 02/11/2017 12/03/2016  Decreased Interest 1 3 1 1  0  Down, Depressed, Hopeless 2 3 1 1  0  PHQ - 2 Score 3 6 2 2  0  Altered sleeping 2 3 0 0 -  Tired, decreased energy 2 3 1 1  -  Change in appetite 1 3 1 1  -  Feeling bad or failure about yourself  3 0 0 0 -  Trouble concentrating 2 3 1 1  -  Moving slowly or fidgety/restless 3 0 0 0 -  Suicidal thoughts 2 0 0 0 -  PHQ-9 Score 18 18 5 5  -  Difficult doing work/chores Not difficult at all Extremely dIfficult Somewhat difficult Somewhat difficult -    Fall Risk: Fall Risk  04/25/2017 03/25/2017 02/15/2017 02/11/2017 12/03/2016  Falls in the past year? Yes No Yes Yes Yes  Comment - - - - -  Number falls in past yr: 2 or more - 2 or more 2 or more 1  Injury with Fall? Yes - No No No  Risk for fall due to : - - - - -  Follow up - - - - -     Functional Status Survey: Is the patient deaf or have difficulty hearing?: No Does the patient have difficulty seeing, even when wearing glasses/contacts?: No Does the patient have difficulty concentrating, remembering, or making decisions?: Yes Does the patient have difficulty walking or climbing stairs?: No Does the patient have difficulty dressing or bathing?: No Does the patient have difficulty doing errands alone such as visiting a doctor's office or shopping?: Yes    Assessment & Plan  1. Moderate recurrent major depression (HCC)  - DULoxetine (CYMBALTA) 60 MG capsule; Take 1 capsule (60 mg total) by mouth daily.  Dispense: 90  capsule; Refill: 1 - Ambulatory referral to Psychiatry  2. Need for immunization against influenza  - Flu vaccine HIGH DOSE PF (Fluzone High dose)  3. Fibromyalgia syndrome  - DULoxetine (CYMBALTA) 60 MG capsule; Take 1 capsule (60 mg total) by mouth daily.  Dispense: 90 capsule; Refill: 1  4. Dizziness  - Ambulatory referral to Neurology  5. Sensorineural hearing loss (SNHL) of both ears  Seeing ENT  6. GAD (generalized anxiety disorder)  - Ambulatory referral to Psychiatry  7. Positive colorectal cancer screening using Cologuard test  Explained importance of having colonoscopy, she will bring hemoccult cards  8. Benign essential HTN  Normal today

## 2017-04-26 ENCOUNTER — Telehealth: Payer: Self-pay

## 2017-04-26 NOTE — Telephone Encounter (Signed)
Copied from [place] [id number]. Topic: Referral - Question >> Apr 26, 2017 11:08 AM Burnis Medin, NT wrote: Reason for CRM: Anderson Malta from Dr. Cephus Shelling [facility] called and said that the referral that was sent to the office regarding the patient is out of network and needs a new referral to a new doctor that's in network.   Referral was sent to [facility].

## 2017-04-28 ENCOUNTER — Inpatient Hospital Stay
Admission: EM | Admit: 2017-04-28 | Discharge: 2017-05-04 | DRG: 841 | Disposition: A | Discharge status: Other Acute Inpatient Hospital | Payer: Medicare Other | Attending: Internal Medicine | Admitting: Internal Medicine

## 2017-04-28 ENCOUNTER — Encounter: Payer: Self-pay | Admitting: Emergency Medicine

## 2017-04-28 ENCOUNTER — Other Ambulatory Visit: Payer: Self-pay

## 2017-04-28 ENCOUNTER — Emergency Department: Payer: Medicare Other

## 2017-04-28 DIAGNOSIS — C8589 Other specified types of non-Hodgkin lymphoma, extranodal and solid organ sites: Secondary | ICD-10-CM | POA: Diagnosis not present

## 2017-04-28 DIAGNOSIS — M797 Fibromyalgia: Secondary | ICD-10-CM | POA: Diagnosis present

## 2017-04-28 DIAGNOSIS — R531 Weakness: Secondary | ICD-10-CM | POA: Diagnosis not present

## 2017-04-28 DIAGNOSIS — R530 Neoplastic (malignant) related fatigue: Secondary | ICD-10-CM | POA: Diagnosis not present

## 2017-04-28 DIAGNOSIS — R930 Abnormal findings on diagnostic imaging of skull and head, not elsewhere classified: Secondary | ICD-10-CM | POA: Diagnosis not present

## 2017-04-28 DIAGNOSIS — N390 Urinary tract infection, site not specified: Secondary | ICD-10-CM

## 2017-04-28 DIAGNOSIS — M069 Rheumatoid arthritis, unspecified: Secondary | ICD-10-CM | POA: Diagnosis present

## 2017-04-28 DIAGNOSIS — E119 Type 2 diabetes mellitus without complications: Secondary | ICD-10-CM | POA: Diagnosis present

## 2017-04-28 DIAGNOSIS — R270 Ataxia, unspecified: Secondary | ICD-10-CM | POA: Diagnosis present

## 2017-04-28 DIAGNOSIS — R42 Dizziness and giddiness: Secondary | ICD-10-CM | POA: Diagnosis not present

## 2017-04-28 DIAGNOSIS — F411 Generalized anxiety disorder: Secondary | ICD-10-CM | POA: Diagnosis present

## 2017-04-28 DIAGNOSIS — I10 Essential (primary) hypertension: Secondary | ICD-10-CM | POA: Diagnosis present

## 2017-04-28 DIAGNOSIS — E039 Hypothyroidism, unspecified: Secondary | ICD-10-CM | POA: Diagnosis present

## 2017-04-28 DIAGNOSIS — F331 Major depressive disorder, recurrent, moderate: Secondary | ICD-10-CM | POA: Diagnosis present

## 2017-04-28 DIAGNOSIS — E871 Hypo-osmolality and hyponatremia: Secondary | ICD-10-CM | POA: Diagnosis present

## 2017-04-28 DIAGNOSIS — F329 Major depressive disorder, single episode, unspecified: Secondary | ICD-10-CM | POA: Diagnosis not present

## 2017-04-28 DIAGNOSIS — R634 Abnormal weight loss: Secondary | ICD-10-CM | POA: Diagnosis not present

## 2017-04-28 DIAGNOSIS — E785 Hyperlipidemia, unspecified: Secondary | ICD-10-CM | POA: Diagnosis present

## 2017-04-28 DIAGNOSIS — Z79899 Other long term (current) drug therapy: Secondary | ICD-10-CM

## 2017-04-28 DIAGNOSIS — Z79890 Hormone replacement therapy (postmenopausal): Secondary | ICD-10-CM | POA: Diagnosis not present

## 2017-04-28 DIAGNOSIS — H538 Other visual disturbances: Secondary | ICD-10-CM | POA: Diagnosis present

## 2017-04-28 DIAGNOSIS — Z85820 Personal history of malignant melanoma of skin: Secondary | ICD-10-CM | POA: Diagnosis not present

## 2017-04-28 DIAGNOSIS — C8591 Non-Hodgkin lymphoma, unspecified, lymph nodes of head, face, and neck: Secondary | ICD-10-CM | POA: Diagnosis not present

## 2017-04-28 DIAGNOSIS — Z7982 Long term (current) use of aspirin: Secondary | ICD-10-CM

## 2017-04-28 DIAGNOSIS — F17210 Nicotine dependence, cigarettes, uncomplicated: Secondary | ICD-10-CM | POA: Diagnosis present

## 2017-04-28 DIAGNOSIS — R0602 Shortness of breath: Secondary | ICD-10-CM

## 2017-04-28 LAB — URINALYSIS, COMPLETE (UACMP) WITH MICROSCOPIC
BILIRUBIN URINE: NEGATIVE
GLUCOSE, UA: NEGATIVE mg/dL
HGB URINE DIPSTICK: NEGATIVE
KETONES UR: NEGATIVE mg/dL
NITRITE: NEGATIVE
PH: 8 (ref 5.0–8.0)
Protein, ur: NEGATIVE mg/dL
Specific Gravity, Urine: 1.009 (ref 1.005–1.030)

## 2017-04-28 LAB — BASIC METABOLIC PANEL
Anion gap: 12 (ref 5–15)
BUN: 12 mg/dL (ref 6–20)
CHLORIDE: 95 mmol/L — AB (ref 101–111)
CO2: 26 mmol/L (ref 22–32)
Calcium: 9.6 mg/dL (ref 8.9–10.3)
Creatinine, Ser: 0.78 mg/dL (ref 0.44–1.00)
GFR calc Af Amer: >60 mL/min (ref >60)
GFR calc non Af Amer: >60 mL/min (ref >60)
Glucose, Bld: 119 mg/dL — ABNORMAL HIGH (ref 65–99)
POTASSIUM: 3.9 mmol/L (ref 3.5–5.1)
SODIUM: 133 mmol/L — AB (ref 135–145)

## 2017-04-28 LAB — GLUCOSE, CAPILLARY: Glucose-Capillary: 139 mg/dL — ABNORMAL HIGH (ref 65–99)

## 2017-04-28 LAB — CBC
HEMATOCRIT: 42 % (ref 35.0–47.0)
Hemoglobin: 14.2 g/dL (ref 12.0–16.0)
MCH: 32.3 pg (ref 26.0–34.0)
MCHC: 33.8 g/dL (ref 32.0–36.0)
MCV: 95.5 fL (ref 80.0–100.0)
Platelets: 398 10*3/uL (ref 150–440)
RBC: 4.4 MIL/uL (ref 3.80–5.20)
RDW: 14.2 % (ref 11.5–14.5)
WBC: 9.6 10*3/uL (ref 3.6–11.0)

## 2017-04-28 MED ORDER — HYDRALAZINE HCL 20 MG/ML IJ SOLN
INTRAMUSCULAR | Status: AC
  Filled 2017-04-28: qty 1

## 2017-04-28 MED ORDER — SODIUM CHLORIDE 0.9% FLUSH
3.0000 mL | Freq: Two times a day (BID) | INTRAVENOUS | Status: DC
  Administered 2017-04-28 – 2017-05-03 (×6): 3 mL via INTRAVENOUS

## 2017-04-28 MED ORDER — INSULIN ASPART 100 UNIT/ML ~~LOC~~ SOLN: 0.0000 [IU] | Freq: Every day | SUBCUTANEOUS | Status: DC

## 2017-04-28 MED ORDER — SODIUM CHLORIDE 0.9 % IV SOLN
INTRAVENOUS | Status: DC
  Administered 2017-04-28 – 2017-05-01 (×5): via INTRAVENOUS

## 2017-04-28 MED ORDER — INSULIN ASPART 100 UNIT/ML ~~LOC~~ SOLN
0.0000 [IU] | Freq: Three times a day (TID) | SUBCUTANEOUS | Status: DC
  Administered 2017-04-30 – 2017-05-01 (×4): 1 [IU] via SUBCUTANEOUS
  Administered 2017-05-02: 2 [IU] via SUBCUTANEOUS
  Administered 2017-05-02 – 2017-05-03 (×3): 1 [IU] via SUBCUTANEOUS
  Filled 2017-04-28 (×9): qty 1

## 2017-04-28 MED ORDER — DULOXETINE HCL 30 MG PO CPEP
60.0000 mg | ORAL_CAPSULE | Freq: Every day | ORAL | Status: DC
  Administered 2017-04-28 – 2017-05-03 (×6): 60 mg via ORAL
  Filled 2017-04-28 (×7): qty 2

## 2017-04-28 MED ORDER — BISACODYL 5 MG PO TBEC: 5.0000 mg | DELAYED_RELEASE_TABLET | Freq: Every day | ORAL | Status: DC | PRN

## 2017-04-28 MED ORDER — ACETAMINOPHEN 650 MG RE SUPP: 650.0000 mg | Freq: Four times a day (QID) | RECTAL | Status: DC | PRN

## 2017-04-28 MED ORDER — HYDROCODONE-ACETAMINOPHEN 5-325 MG PO TABS
1.0000 | ORAL_TABLET | ORAL | Status: DC | PRN
Start: 2017-04-28 — End: 2017-05-04
  Administered 2017-04-29: 2 via ORAL
  Filled 2017-04-28: qty 2

## 2017-04-28 MED ORDER — MAGNESIUM 400 MG PO CAPS: 1.0000 | ORAL_CAPSULE | Freq: Every day | ORAL | Status: DC

## 2017-04-28 MED ORDER — CEPHALEXIN 500 MG PO CAPS
500.0000 mg | ORAL_CAPSULE | Freq: Two times a day (BID) | ORAL | Status: DC
  Administered 2017-04-28 – 2017-05-03 (×11): 500 mg via ORAL
  Filled 2017-04-28 (×11): qty 1

## 2017-04-28 MED ORDER — FOLIC ACID 1 MG PO TABS
1.0000 mg | ORAL_TABLET | Freq: Every day | ORAL | Status: DC
  Administered 2017-04-28 – 2017-05-03 (×6): 1 mg via ORAL
  Filled 2017-04-28 (×6): qty 1

## 2017-04-28 MED ORDER — SENNOSIDES-DOCUSATE SODIUM 8.6-50 MG PO TABS: 1.0000 | ORAL_TABLET | Freq: Every evening | ORAL | Status: DC | PRN

## 2017-04-28 MED ORDER — ONDANSETRON HCL 4 MG PO TABS: 4.0000 mg | ORAL_TABLET | Freq: Four times a day (QID) | ORAL | Status: DC | PRN

## 2017-04-28 MED ORDER — MAGNESIUM OXIDE 400 (241.3 MG) MG PO TABS
400.0000 mg | ORAL_TABLET | Freq: Every day | ORAL | Status: DC
  Administered 2017-04-28 – 2017-05-03 (×6): 400 mg via ORAL
  Filled 2017-04-28 (×6): qty 1

## 2017-04-28 MED ORDER — ONDANSETRON HCL 4 MG/2ML IJ SOLN
4.0000 mg | Freq: Four times a day (QID) | INTRAMUSCULAR | Status: DC | PRN
  Administered 2017-04-29: 02:00:00 4 mg via INTRAVENOUS
  Filled 2017-04-28: qty 2

## 2017-04-28 MED ORDER — ACETAMINOPHEN 325 MG PO TABS
650.0000 mg | ORAL_TABLET | Freq: Four times a day (QID) | ORAL | Status: DC | PRN
  Administered 2017-04-29 – 2017-05-02 (×3): 650 mg via ORAL
  Filled 2017-04-28 (×4): qty 2

## 2017-04-28 MED ORDER — ENOXAPARIN SODIUM 40 MG/0.4ML ~~LOC~~ SOLN
40.0000 mg | SUBCUTANEOUS | Status: DC
  Administered 2017-04-28 – 2017-05-02 (×4): 40 mg via SUBCUTANEOUS
  Filled 2017-04-28 (×5): qty 0.4

## 2017-04-28 MED ORDER — HYDRALAZINE HCL 20 MG/ML IJ SOLN
10.0000 mg | Freq: Four times a day (QID) | INTRAMUSCULAR | Status: DC | PRN
  Administered 2017-04-28 – 2017-05-02 (×2): 10 mg via INTRAVENOUS
  Filled 2017-04-28: qty 1

## 2017-04-28 MED ORDER — SODIUM CHLORIDE 0.9% FLUSH: 3.0000 mL | INTRAVENOUS | Status: DC | PRN

## 2017-04-28 MED ORDER — LEVOTHYROXINE SODIUM 50 MCG PO TABS
50.0000 ug | ORAL_TABLET | Freq: Every day | ORAL | Status: DC
  Administered 2017-04-29 – 2017-05-03 (×5): 50 ug via ORAL
  Filled 2017-04-28 (×5): qty 1

## 2017-04-28 MED ORDER — ALBUTEROL SULFATE (2.5 MG/3ML) 0.083% IN NEBU: 2.5000 mg | INHALATION_SOLUTION | RESPIRATORY_TRACT | Status: DC | PRN

## 2017-04-28 MED ORDER — SODIUM CHLORIDE 0.9 % IV SOLN: 250.0000 mL | INTRAVENOUS | Status: DC | PRN

## 2017-04-28 NOTE — ED Notes (Signed)
Ambulated to restroom with assistance.

## 2017-04-28 NOTE — ED Notes (Signed)
Pending MRI. Pt aware of plan of care.

## 2017-04-28 NOTE — Progress Notes (Signed)
Per Dr Bridgett Larsson no stroke workup

## 2017-04-28 NOTE — ED Notes (Signed)
Patient transported to MRI 

## 2017-04-28 NOTE — Plan of Care (Signed)
Pt alert, resting quietly, no complaints, voices any needs

## 2017-04-28 NOTE — H&P (Signed)
[facility] NAME: Renee Cowan    MR#:  [id number]  DATE OF BIRTH:  03-Apr-1946  DATE OF ADMISSION:  04/28/2017  PRIMARY CARE PHYSICIAN: Steele Sizer, MD   REQUESTING/REFERRING PHYSICIAN: Hinda Kehr, MD  CHIEF COMPLAINT:   Chief Complaint  Patient presents with  . Dizziness  . Hypertension   Dizziness and imbalance for several days, worsening and fall today. HISTORY OF PRESENT ILLNESS:  Renee Cowan  is a 71 y.o. female with a known history of hypertension, diabetes, hyperlipidemia, rheumatoid arthritis, fibromyalgia and hypothyroidism.  The patient has had chronic vertigo and gait instability in the past.  She presented to the ED due to generalized weakness, worsening dizziness and gait ataxia.  She also complains of blurred vision, nausea and vomiting today.  She said she has hearing loss and weight loss recently.  She denies any focal weakness, numbness, tingling or incontinence or slurred speech.  MRI of the brain today suspect lymphoma or focal subependymal infection. The latter is considered less likely given the lack of disease elsewhere.  ED physician suggested lumbar puncture, but the patient refused this time. PAST MEDICAL HISTORY:   Past Medical History:  Diagnosis Date  . Allergy   . Cataract   . Concussion   . Depression   . Fibromyalgia   . Hyperlipidemia   . Hypertension   . Hypothyroidism   . Osteoporosis   . Rheumatoid arteritis   . Trigger finger   . Vitamin D deficiency     PAST SURGICAL HISTORY:   Past Surgical History:  Procedure Laterality Date  . CATARACT EXTRACTION, BILATERAL Bilateral [id number] and [id number]  . CHOLECYSTECTOMY    . CYST REMOVAL TRUNK    . melaoma excesion    . trigger thumb release      SOCIAL HISTORY:   Social History   Tobacco Use  . Smoking status: Current Every Day Smoker    Packs/day: 0.50    Years: 40.00    Pack years: 20.00    Types: Cigarettes    Start date: 12/03/1976  . Smokeless tobacco: Never Used  Substance Use Topics  . Alcohol use: Yes    Alcohol/week: 0.0 oz    Comment: occasionally    FAMILY HISTORY:   Family History  Problem Relation Age of Onset  . Cancer Mother        cervical  . Tuberculosis Father   . Kidney cancer Neg Hx   . Bladder Cancer Neg Hx     DRUG ALLERGIES:   Allergies  Allergen Reactions  . Penicillins Rash  . Sulfa Antibiotics Rash    REVIEW OF SYSTEMS:   Review of Systems  Constitutional: Positive for malaise/fatigue. Negative for chills and fever.  HENT: Positive for hearing loss. Negative for sore throat.   Eyes: Positive for blurred vision. Negative for double vision.  Respiratory: Negative for cough, hemoptysis, shortness of breath, wheezing and stridor.   Cardiovascular: Negative for chest pain, palpitations, orthopnea and leg swelling.  Gastrointestinal: Negative for abdominal pain, blood in stool, diarrhea, melena, nausea and vomiting.  Genitourinary: Negative for dysuria, flank pain and hematuria.  Musculoskeletal: Negative for back pain and joint pain.  Skin: Negative for rash.  Neurological: Positive for dizziness and weakness. Negative for tingling, tremors, sensory change, speech change, focal weakness, seizures, loss of consciousness and headaches.  Endo/Heme/Allergies: Negative for polydipsia.  Psychiatric/Behavioral: Negative for depression. The patient is not nervous/anxious.     MEDICATIONS AT  HOME:   Prior to Admission medications   Medication Sig Start Date End Date Taking? Authorizing Provider  aspirin (ECOTRIN LOW STRENGTH) 81 MG EC tablet ECOTRIN LOW STRENGTH, 81MG  (Oral Tablet Delayed Release)  1 po qday for 0 days  Quantity: 30.00;  Refills: 0   Ordered :17-Aug-2010  Steele Sizer MD;  Started 20-Sep-2008 Active 09/20/08   [provider]  Calcium Carb-Cholecalciferol (CALCIUM + D3) 600-200 MG-UNIT TABS Take by mouth.    [provider]  conjugated estrogens (PREMARIN) vaginal cream Place 1 Applicatorful vaginally daily. Apply 0.5mg  (pea-sized amount)  just inside the vaginal introitus with a finger-tip every night for two weeks and then Monday, Wednesday and Friday nights. 03/04/17   Zara Council A, PA-C  cyanocobalamin 500 MCG tablet Take 500 mcg by mouth daily.    [provider]  cyclobenzaprine (FLEXERIL) 10 MG tablet Take 1 tablet (10 mg total) by mouth 3 (three) times daily as needed for muscle spasms. 05/29/16   Steele Sizer, MD  DULoxetine (CYMBALTA) 60 MG capsule Take 1 capsule (60 mg total) by mouth daily. 04/25/17   Steele Sizer, MD  folic acid (FOLVITE) 1 MG tablet Take 1 mg by mouth daily. 11/08/14   [provider]  levothyroxine (SYNTHROID, LEVOTHROID) 50 MCG tablet TAKE 1 TABLET BY MOUTH  DAILY BEFORE BREAKFAST 12/03/16   Steele Sizer, MD  Magnesium 400 MG CAPS Take 1 capsule by mouth daily. 12/30/15   Steele Sizer, MD  meloxicam (MOBIC) 15 MG tablet Take 1 tablet (15 mg total) by mouth daily. Patient taking differently: Take 15 mg by mouth as needed.  05/26/15   Steele Sizer, MD  methotrexate (RHEUMATREX) 2.5 MG tablet once a week.  11/04/14   [provider]      VITAL SIGNS:  Blood pressure (!) 188/106, pulse 85, temperature 97.8 F (36.6 C), temperature source Oral, resp. rate 12, height 5\' 2"  (1.575 m), weight 113 lb (51.3 kg), SpO2 100 %.  PHYSICAL EXAMINATION:  Physical Exam  GENERAL:  71 y.o.-year-old patient lying in the bed with no acute distress.  EYES: Pupils equal, round, reactive to light and accommodation. No scleral icterus. Extraocular muscles intact.  HEENT: Head atraumatic, normocephalic. Oropharynx and nasopharynx clear.  NECK:  Supple, no jugular venous distention. No thyroid enlargement, no tenderness.  LUNGS: Normal breath sounds bilaterally, no wheezing, rales,rhonchi or crepitation. No use of accessory muscles of respiration.  CARDIOVASCULAR:  S1, S2 normal. No murmurs, rubs, or gallops.  ABDOMEN: Soft, nontender, nondistended. Bowel sounds present. No organomegaly or mass.  EXTREMITIES: No pedal edema, cyanosis, or clubbing.  NEUROLOGIC: Cranial nerves II through XII are intact. Muscle strength 5/5 in all extremities. Sensation intact. Gait not checked.  PSYCHIATRIC: The patient is alert and oriented x 3.  SKIN: No obvious rash, lesion, or ulcer.   LABORATORY PANEL:   CBC Recent Labs  Lab 04/28/17 1349  WBC 9.6  HGB 14.2  HCT 42.0  PLT 398   ------------------------------------------------------------------------------------------------------------------  Chemistries  Recent Labs  Lab 04/28/17 1349  NA 133*  K 3.9  CL 95*  CO2 26  GLUCOSE 119*  BUN 12  CREATININE 0.78  CALCIUM 9.6   ------------------------------------------------------------------------------------------------------------------  Cardiac Enzymes No results for input(s): TROPONINI in the last 168 hours. ------------------------------------------------------------------------------------------------------------------  RADIOLOGY:  Mr Angiogram Head Wo Contrast  Result Date: 04/28/2017 CLINICAL DATA:  Ataxia.  Stroke suspected.  Subacute neuro deficit. EXAM: MRI HEAD WITHOUT CONTRAST MRA HEAD WITHOUT CONTRAST TECHNIQUE: Multiplanar, multiecho pulse sequences of  the brain and surrounding structures were obtained without intravenous contrast. Angiographic images of the head were obtained using MRA technique without contrast. COMPARISON:  MRI brain 10/28/2015 FINDINGS: MRI HEAD FINDINGS Brain: There is extensive progression of diffuse white matter disease. Masslike T2 change is present about the atrium of the right lateral ventricle. There is 5 mm rim of T2 hyperintense tissue extending into the splenium of the corpus callosum with mild restricted diffusion and surrounding T2 signal. Similar signal changes of heterogeneous T2 signal and restricted  diffusion are present adjacent to the fourth ventricle involving the inferior vermis and medial right cerebellum. There is also slight restricted diffusion involving the posterior limb of the left internal capsule. No acute hemorrhage or mass lesion is present. Atrophy has progressed over the last 18 months. The ventricles are proportionate to the degree of atrophy. No significant extra-axial fluid collection is present. The internal auditory canals are within normal limits. White matter changes extend into the brainstem. Vascular: Flow is present in the major intracranial arteries. Skull and upper cervical spine: The skullbase is within normal limits. The craniocervical junction is normal. Marrow signal is normal. Midline sagittal structures are otherwise unremarkable. Sinuses/Orbits: A polyp or mucous retention cyst is node along the inferior aspect of the right maxillary sinus. The paranasal sinuses and mastoid air cells are otherwise clear. The globes and orbits are within normal limits. MRA HEAD FINDINGS MRA is degraded by patient motion. This limits evaluation of distal vessels. Mild irregularity is present in the cavernous internal carotid arteries without a significant stenosis to the ICA terminus. A1 and M1 segments are normal bilaterally. MCA bifurcations are intact. Attenuation of distal MCA and ACA branch vessels is likely exaggerated by patient motion. Pain right vertebral artery is dominant. Signal loss in the vertebral arteries bilaterally is artifactual. Distal PICA vessels are seen. The basilar artery is normal. There is asymmetric attenuation of the proximal left P1 segment. Distal PCA branch vessels are attenuated bilaterally. IMPRESSION: 1. Focal subependymal T2 signal change about the atrium of the right lateral ventricle with restricted diffusion. This raises concern for lymphoma or focal subependymal infection. The latter is considered less likely given the lack of disease elsewhere. An  aggressive demyelinating process is considered less likely. Vasculitis is also considered. Question HIV or other immune mediated disease. 2. Similar findings are present along the right side of the fourth ventricle involving the inferior vermis and medial right cerebellum. 3. T2 change and restricted diffusion in the posterior limb of the left internal capsule. This may be related to the same process or subacute infarct. 4. Significant progression of diffuse white matter disease since the prior exam. This is likely related to small-vessel ischemia. 5. Narrowing of proximal left posterior cerebral artery. 6. No other significant proximal stenosis or branch vessel occlusions. 7. Evaluation of distal vessels is degraded by significant patient motion. MRI the brain with contrast would be useful for further refined aunt of the differential diagnosis. Lumbar puncture may be useful further evaluation as well given the ventricular involvement. These results were called by telephone at the time of interpretation on 04/28/2017 at 8:09 pm to Dr. Hinda Kehr , who verbally acknowledged these results. Electronically Signed   By: San Morelle M.D.   On: 04/28/2017 20:11   Mr Brain Wo Contrast  Result Date: 04/28/2017 CLINICAL DATA:  Ataxia.  Stroke suspected.  Subacute neuro deficit. EXAM: MRI HEAD WITHOUT CONTRAST MRA HEAD WITHOUT CONTRAST TECHNIQUE: Multiplanar, multiecho pulse sequences of the brain and  surrounding structures were obtained without intravenous contrast. Angiographic images of the head were obtained using MRA technique without contrast. COMPARISON:  MRI brain 10/28/2015 FINDINGS: MRI HEAD FINDINGS Brain: There is extensive progression of diffuse white matter disease. Masslike T2 change is present about the atrium of the right lateral ventricle. There is 5 mm rim of T2 hyperintense tissue extending into the splenium of the corpus callosum with mild restricted diffusion and surrounding T2 signal.  Similar signal changes of heterogeneous T2 signal and restricted diffusion are present adjacent to the fourth ventricle involving the inferior vermis and medial right cerebellum. There is also slight restricted diffusion involving the posterior limb of the left internal capsule. No acute hemorrhage or mass lesion is present. Atrophy has progressed over the last 18 months. The ventricles are proportionate to the degree of atrophy. No significant extra-axial fluid collection is present. The internal auditory canals are within normal limits. White matter changes extend into the brainstem. Vascular: Flow is present in the major intracranial arteries. Skull and upper cervical spine: The skullbase is within normal limits. The craniocervical junction is normal. Marrow signal is normal. Midline sagittal structures are otherwise unremarkable. Sinuses/Orbits: A polyp or mucous retention cyst is node along the inferior aspect of the right maxillary sinus. The paranasal sinuses and mastoid air cells are otherwise clear. The globes and orbits are within normal limits. MRA HEAD FINDINGS MRA is degraded by patient motion. This limits evaluation of distal vessels. Mild irregularity is present in the cavernous internal carotid arteries without a significant stenosis to the ICA terminus. A1 and M1 segments are normal bilaterally. MCA bifurcations are intact. Attenuation of distal MCA and ACA branch vessels is likely exaggerated by patient motion. Pain right vertebral artery is dominant. Signal loss in the vertebral arteries bilaterally is artifactual. Distal PICA vessels are seen. The basilar artery is normal. There is asymmetric attenuation of the proximal left P1 segment. Distal PCA branch vessels are attenuated bilaterally. IMPRESSION: 1. Focal subependymal T2 signal change about the atrium of the right lateral ventricle with restricted diffusion. This raises concern for lymphoma or focal subependymal infection. The latter is  considered less likely given the lack of disease elsewhere. An aggressive demyelinating process is considered less likely. Vasculitis is also considered. Question HIV or other immune mediated disease. 2. Similar findings are present along the right side of the fourth ventricle involving the inferior vermis and medial right cerebellum. 3. T2 change and restricted diffusion in the posterior limb of the left internal capsule. This may be related to the same process or subacute infarct. 4. Significant progression of diffuse white matter disease since the prior exam. This is likely related to small-vessel ischemia. 5. Narrowing of proximal left posterior cerebral artery. 6. No other significant proximal stenosis or branch vessel occlusions. 7. Evaluation of distal vessels is degraded by significant patient motion. MRI the brain with contrast would be useful for further refined aunt of the differential diagnosis. Lumbar puncture may be useful further evaluation as well given the ventricular involvement. These results were called by telephone at the time of interpretation on 04/28/2017 at 8:09 pm to Dr. Hinda Kehr , who verbally acknowledged these results. Electronically Signed   By: San Morelle M.D.   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 The patient will be admitted to medical floor. MRI of the brain with contrast and neurology consult.  Ataxia.  Fall precaution.  PT evaluation.  Hyponatremia.  Normal saline IV in the follow-up BMP.  Hypertension.  IV hydralazine as needed. Diabetes.  Sliding scale. Tobacco abuse.  Smoking cessation was consulted for 3-4 minutes.  All the records are reviewed and case discussed with ED provider. Management plans discussed with the patient, family and they are in agreement.  CODE STATUS: Full code  TOTAL TIME TAKING CARE OF THIS PATIENT: 55 minutes.    Demetrios Loll M.D on 04/28/2017 at 8:51 PM  Between 7am to 6pm  - Pager - [phone]  After 6pm go to [email or web address] - [employer] [facility]  Office  [phone]  CC: Primary care physician; Steele Sizer, MD   Note: This dictation was prepared with Dragon dictation along with smaller phrase technology. Any transcriptional errors that result from this process are unin

## 2017-04-28 NOTE — ED Notes (Signed)
Pt back from MRI 

## 2017-04-28 NOTE — ED Provider Notes (Signed)
[facility] Emergency Department Provider Note  ____________________________________________   First MD Initiated Contact with Patient 04/28/17 1559     (approximate)  I have reviewed the triage vital signs and the nursing notes.   HISTORY  Chief Complaint Dizziness and Hypertension    HPI Renee Cowan is a 71 y.o. female with an extensive chronic medical history as listed below which also includes chronic vertigo/gait instability for which she has seen ENT in the past.  She tends to the emergency department with a friend today for general malaise, worsening dizziness and gait instability, and decreased appetite.  Her symptoms have been going on for about a year and a half and that was when she saw ENT she sees a physical therapist to help with her gait.  She has had no new acute events but over the last few days has felt increasingly bad all over.  She has had no specific focal weakness or numbness but she feels more unsteady of gait.  She has not had any episodes of passing out.  She has had decreased appetite.  She has had nausea and occasional vomiting but that is not new for her.  She denies fever/chills, chest pain, shortness of breath, abdominal pain, and dysuria.  She states that she is extremely sensitive to "most medications"and believes that she had a negative reaction to an antibiotic for a urinary tract infection more than a year ago.  Nothing in particular makes her symptoms worse and she describes them as severe.  Past Medical History:  Diagnosis Date  . Allergy   . Cataract   . Concussion   . Depression   . Fibromyalgia   . Hyperlipidemia   . Hypertension   . Hypothyroidism   . Osteoporosis   . Rheumatoid arteritis   . Trigger finger   . Vitamin D deficiency     Patient Active Problem List   Diagnosis Date Noted  . Lymphoma ([place]) 04/28/2017  . GAD (generalized anxiety disorder) 04/25/2017  . Moderate recurrent major depression ([place])  04/25/2017  . Type 2 diabetes mellitus with microalbuminuria, without long-term current use of insulin ([place]) 11/28/2015  . Benign essential HTN 05/26/2015  . Depression, major, in remission ([place]) 05/26/2015  . Dyslipidemia 05/26/2015  . Type 2 diabetes mellitus with renal manifestations, controlled ([place]) 05/26/2015  . Fibromyalgia syndrome 05/26/2015  . Personal history of malignant melanoma of skin 05/26/2015  . Adult hypothyroidism 05/26/2015  . Osteopenia after menopause 05/26/2015  . Tobacco use 05/26/2015  . Vitamin D deficiency 05/26/2015  . Rheumatoid arthritis ([place]) 05/26/2015  . Acquired trigger finger 10/11/2007    Past Surgical History:  Procedure Laterality Date  . CATARACT EXTRACTION, BILATERAL Bilateral [id number] and [id number]  . CHOLECYSTECTOMY    . CYST REMOVAL TRUNK    . melaoma excesion    . trigger thumb release      Prior to Admission medications   Medication Sig Start Date End Date Taking? Authorizing Provider  aspirin (ECOTRIN LOW STRENGTH) 81 MG EC tablet ECOTRIN LOW STRENGTH, 81MG  (Oral Tablet Delayed Release)  1 po qday for 0 days  Quantity: 30.00;  Refills: 0   Ordered :17-Aug-2010  Steele Sizer MD;  Started 20-Sep-2008 Active 09/20/08   [provider]  Calcium Carb-Cholecalciferol (CALCIUM + D3) 600-200 MG-UNIT TABS Take by mouth.    [provider]  conjugated estrogens (PREMARIN) vaginal cream Place 1 Applicatorful vaginally daily. Apply 0.5mg  (pea-sized amount)  just inside the vaginal introitus with a  finger-tip every night for two weeks and then Monday, Wednesday and Friday nights. 03/04/17   Zara Council A, PA-C  cyanocobalamin 500 MCG tablet Take 500 mcg by mouth daily.    [provider]  cyclobenzaprine (FLEXERIL) 10 MG tablet Take 1 tablet (10 mg total) by mouth 3 (three) times daily as needed for muscle spasms. 05/29/16   Steele Sizer, MD  DULoxetine (CYMBALTA) 60 MG capsule Take 1 capsule (60 mg total) by  mouth daily. 04/25/17   Steele Sizer, MD  folic acid (FOLVITE) 1 MG tablet Take 1 mg by mouth daily. 11/08/14   [provider]  levothyroxine (SYNTHROID, LEVOTHROID) 50 MCG tablet TAKE 1 TABLET BY MOUTH  DAILY BEFORE BREAKFAST 12/03/16   Steele Sizer, MD  Magnesium 400 MG CAPS Take 1 capsule by mouth daily. 12/30/15   Steele Sizer, MD  meloxicam (MOBIC) 15 MG tablet Take 1 tablet (15 mg total) by mouth daily. Patient taking differently: Take 15 mg by mouth as needed.  05/26/15   Steele Sizer, MD  methotrexate (RHEUMATREX) 2.5 MG tablet once a week.  11/04/14   [provider]    Allergies Penicillins and Sulfa antibiotics  Family History  Problem Relation Age of Onset  . Cancer Mother        cervical  . Tuberculosis Father   . Kidney cancer Neg Hx   . Bladder Cancer Neg Hx     Social History Social History   Tobacco Use  . Smoking status: Current Every Day Smoker    Packs/day: 0.50    Years: 40.00    Pack years: 20.00    Types: Cigarettes    Start date: 12/03/1976  . Smokeless tobacco: Never Used  Substance Use Topics  . Alcohol use: Yes    Alcohol/week: 0.0 oz    Comment: occasionally  . Drug use: No    Review of Systems Constitutional: No fever/chills.  General malaise. Eyes: No visual changes. ENT: No sore throat. Cardiovascular: Denies chest pain. Respiratory: Denies shortness of breath. Gastrointestinal: No abdominal pain.  Occasional nausea and vomiting. Genitourinary: Negative for dysuria. Musculoskeletal: Negative for neck pain.  Negative for back pain. Integumentary: Negative for rash. Neurological: Gait instability, chronic but worse recently.  Negative for headaches, focal weakness or numbness.   ____________________________________________   PHYSICAL EXAM:  VITAL SIGNS: ED Triage Vitals  Enc Vitals Group     BP 04/28/17 1349 (!) 152/91     Pulse Rate 04/28/17 1349 82     Resp 04/28/17 1349 16     Temp 04/28/17 1349 97.8  F (36.6 C)     Temp Source 04/28/17 1349 Oral     SpO2 --      Weight 04/28/17 1347 51.3 kg (113 lb)     Height 04/28/17 1347 1.575 m (5\' 2" )     Head Circumference --      Peak Flow --      Pain Score 04/28/17 1347 0     Pain Loc --      Pain Edu? --      Excl. in [place]? --     Constitutional: Alert and oriented. Well appearing and in no acute distress. Eyes: Conjunctivae are normal. PERRL. EOMI. no nystagmus Head: Atraumatic. Nose: No congestion/rhinnorhea. Mouth/Throat: Mucous membranes are moist. Cardiovascular: Normal rate, regular rhythm. Good peripheral circulation. Grossly normal heart sounds. Respiratory: Normal respiratory effort.  No retractions. Lungs CTAB. Gastrointestinal: Soft and nontender. No distention.  Musculoskeletal: No lower extremity tenderness nor edema.  No gross deformities of extremities.  Some tenderness to palpation of the right lateral quadricep after a fall in the waiting room (see below) Neurologic:  Normal speech and language. No gross focal neurologic deficits are appreciated.  Skin:  Skin is warm, dry and intact. No rash noted. Psychiatric: Mood and affect are normal. Speech and behavior are normal.  ____________________________________________   LABS (all labs ordered are listed, but only abnormal results are displayed)  Labs Reviewed  BASIC METABOLIC PANEL - Abnormal; Notable for the following components:      Result Value   Sodium 133 (*)    Chloride 95 (*)    Glucose, Bld 119 (*)    All other components within normal limits  URINALYSIS, COMPLETE (UACMP) WITH MICROSCOPIC - Abnormal; Notable for the following components:   Color, Urine YELLOW (*)    APPearance CLOUDY (*)    Leukocytes, UA MODERATE (*)    Bacteria, UA RARE (*)    Squamous Epithelial / LPF 0-5 (*)    Non Squamous Epithelial 0-5 (*)    All other components within normal limits  URINE CULTURE  CBC  BASIC METABOLIC PANEL  CBC  CBG MONITORING, ED    ____________________________________________  EKG  ED ECG REPORT I, Kariya Lavergne, the attending physician, personally viewed and interpreted this ECG.  Date: 04/28/2017 EKG Time: 13: 58 Rate: 85 Rhythm: normal sinus rhythm QRS Axis: normal Intervals: Borderline LVH ST/T Wave abnormalities: normal Narrative Interpretation: no evidence of acute ischemia  ____________________________________________  RADIOLOGY   Mr Angiogram Head Wo Contrast  Result Date: 04/28/2017 CLINICAL DATA:  Ataxia.  Stroke suspected.  Subacute neuro deficit. EXAM: MRI HEAD WITHOUT CONTRAST MRA HEAD WITHOUT CONTRAST TECHNIQUE: Multiplanar, multiecho pulse sequences of the brain and surrounding structures were obtained without intravenous contrast. Angiographic images of the head were obtained using MRA technique without contrast. COMPARISON:  MRI brain 10/28/2015 FINDINGS: MRI HEAD FINDINGS Brain: There is extensive progression of diffuse white matter disease. Masslike T2 change is present about the atrium of the right lateral ventricle. There is 5 mm rim of T2 hyperintense tissue extending into the splenium of the corpus callosum with mild restricted diffusion and surrounding T2 signal. Similar signal changes of heterogeneous T2 signal and restricted diffusion are present adjacent to the fourth ventricle involving the inferior vermis and medial right cerebellum. There is also slight restricted diffusion involving the posterior limb of the left internal capsule. No acute hemorrhage or mass lesion is present. Atrophy has progressed over the last 18 months. The ventricles are proportionate to the degree of atrophy. No significant extra-axial fluid collection is present. The internal auditory canals are within normal limits. White matter changes extend into the brainstem. Vascular: Flow is present in the major intracranial arteries. Skull and upper cervical spine: The skullbase is within normal limits. The craniocervical  junction is normal. Marrow signal is normal. Midline sagittal structures are otherwise unremarkable. Sinuses/Orbits: A polyp or mucous retention cyst is node along the inferior aspect of the right maxillary sinus. The paranasal sinuses and mastoid air cells are otherwise clear. The globes and orbits are within normal limits. MRA HEAD FINDINGS MRA is degraded by patient motion. This limits evaluation of distal vessels. Mild irregularity is present in the cavernous internal carotid arteries without a significant stenosis to the ICA terminus. A1 and M1 segments are normal bilaterally. MCA bifurcations are intact. Attenuation of distal MCA and ACA branch vessels is likely exaggerated by patient motion. Pain right vertebral artery is dominant. Signal  loss in the vertebral arteries bilaterally is artifactual. Distal PICA vessels are seen. The basilar artery is normal. There is asymmetric attenuation of the proximal left P1 segment. Distal PCA branch vessels are attenuated bilaterally. IMPRESSION: 1. Focal subependymal T2 signal change about the atrium of the right lateral ventricle with restricted diffusion. This raises concern for lymphoma or focal subependymal infection. The latter is considered less likely given the lack of disease elsewhere. An aggressive demyelinating process is considered less likely. Vasculitis is also considered. Question HIV or other immune mediated disease. 2. Similar findings are present along the right side of the fourth ventricle involving the inferior vermis and medial right cerebellum. 3. T2 change and restricted diffusion in the posterior limb of the left internal capsule. This may be related to the same process or subacute infarct. 4. Significant progression of diffuse white matter disease since the prior exam. This is likely related to small-vessel ischemia. 5. Narrowing of proximal left posterior cerebral artery. 6. No other significant proximal stenosis or branch vessel occlusions. 7.  Evaluation of distal vessels is degraded by significant patient motion. MRI the brain with contrast would be useful for further refined aunt of the differential diagnosis. Lumbar puncture may be useful further evaluation as well given the ventricular involvement. These results were called by telephone at the time of interpretation on 04/28/2017 at 8:09 pm to Dr. Hinda Kehr , who verbally acknowledged these results. Electronically Signed   By: San Morelle M.D.   On: 04/28/2017 20:11   Mr Brain Wo Contrast  Result Date: 04/28/2017 CLINICAL DATA:  Ataxia.  Stroke suspected.  Subacute neuro deficit. EXAM: MRI HEAD WITHOUT CONTRAST MRA HEAD WITHOUT CONTRAST TECHNIQUE: Multiplanar, multiecho pulse sequences of the brain and surrounding structures were obtained without intravenous contrast. Angiographic images of the head were obtained using MRA technique without contrast. COMPARISON:  MRI brain 10/28/2015 FINDINGS: MRI HEAD FINDINGS Brain: There is extensive progression of diffuse white matter disease. Masslike T2 change is present about the atrium of the right lateral ventricle. There is 5 mm rim of T2 hyperintense tissue extending into the splenium of the corpus callosum with mild restricted diffusion and surrounding T2 signal. Similar signal changes of heterogeneous T2 signal and restricted diffusion are present adjacent to the fourth ventricle involving the inferior vermis and medial right cerebellum. There is also slight restricted diffusion involving the posterior limb of the left internal capsule. No acute hemorrhage or mass lesion is present. Atrophy has progressed over the last 18 months. The ventricles are proportionate to the degree of atrophy. No significant extra-axial fluid collection is present. The internal auditory canals are within normal limits. White matter changes extend into the brainstem. Vascular: Flow is present in the major intracranial arteries. Skull and upper cervical spine: The  skullbase is within normal limits. The craniocervical junction is normal. Marrow signal is normal. Midline sagittal structures are otherwise unremarkable. Sinuses/Orbits: A polyp or mucous retention cyst is node along the inferior aspect of the right maxillary sinus. The paranasal sinuses and mastoid air cells are otherwise clear. The globes and orbits are within normal limits. MRA HEAD FINDINGS MRA is degraded by patient motion. This limits evaluation of distal vessels. Mild irregularity is present in the cavernous internal carotid arteries without a significant stenosis to the ICA terminus. A1 and M1 segments are normal bilaterally. MCA bifurcations are intact. Attenuation of distal MCA and ACA branch vessels is likely exaggerated by patient motion. Pain right vertebral artery is dominant. Signal loss in the  vertebral arteries bilaterally is artifactual. Distal PICA vessels are seen. The basilar artery is normal. There is asymmetric attenuation of the proximal left P1 segment. Distal PCA branch vessels are attenuated bilaterally. IMPRESSION: 1. Focal subependymal T2 signal change about the atrium of the right lateral ventricle with restricted diffusion. This raises concern for lymphoma or focal subependymal infection. The latter is considered less likely given the lack of disease elsewhere. An aggressive demyelinating process is considered less likely. Vasculitis is also considered. Question HIV or other immune mediated disease. 2. Similar findings are present along the right side of the fourth ventricle involving the inferior vermis and medial right cerebellum. 3. T2 change and restricted diffusion in the posterior limb of the left internal capsule. This may be related to the same process or subacute infarct. 4. Significant progression of diffuse white matter disease since the prior exam. This is likely related to small-vessel ischemia. 5. Narrowing of proximal left posterior cerebral artery. 6. No other  significant proximal stenosis or branch vessel occlusions. 7. Evaluation of distal vessels is degraded by significant patient motion. MRI the brain with contrast would be useful for further refined aunt of the differential diagnosis. Lumbar puncture may be useful further evaluation as well given the ventricular involvement. These results were called by telephone at the time of interpretation on 04/28/2017 at 8:09 pm to Dr. Hinda Kehr , who verbally acknowledged these results. Electronically Signed   By: San Morelle M.D.   On: 04/28/2017 20:11    ____________________________________________   PROCEDURES  Critical Care performed: No   Procedure(s) performed:   Procedures   ____________________________________________   INITIAL IMPRESSION / ASSESSMENT AND PLAN / ED COURSE  As part of my medical decision making, I reviewed the following data within the [facility] notes reviewed and incorporated, Labs reviewed  and Old chart reviewed    Differential diagnosis includes, but is not limited to, CVA/TIA, benign peripheral vertigo, urinary tract infection, other infectious process, etc.  Over the patient has had symptoms for a long time and although she reports that they are more acute today she seems to be relatively asymptomatic.  She had a fall in the waiting room and right proximal lower extremity and has some tenderness to palpation but she is able to move her leg around without any evidence of acute injury and she says "it is just a contusion" and she does not want an x-ray.  I see no evidence of fracture or dislocation.  She has no focal neuro abnormalities at this time.  We discussed acute versus chronic issues and the need to rule out any.  At this time will obtain an MR brain and MR angiogram head to rule out cerebrovascular insufficiency and CVA/TIA.  She is comfortable with the plan to follow-up as an outpatient if we do not identify any acute or  emergent issues today.  Clinical Course as of Apr 29 2103  Yessika Fetter Apr 28, 2017  2059 The neuro radiologist called and spoke with me by phone.  He stated that her brain has changed substantially in 1-1/2 years since her last RI.  There are multiple abnormalities and he is concerned about the possibility of a CNS neoplastic disease.  He recommended MR brain with IV contrast and she would likely benefit from neurology evaluation and possibly a lumbar puncture.  I discussed all these results with the patient and she very much does not want the lumbar puncture unless it is absolutely necessary.  I  suggested we admit her to the hospital for neurology consult and possibly neurosurgical consult as needed with the additional MRI with IV contrast and a lumbar puncture if recommended by neurology.  She understands and agrees with the plan.The patient also has a urinary tract infection for which I have ordered Keflex twice daily times 7 days.  I will make sure the hospitalist knows about this as well and a urine culture is pending.  [CF]    Clinical Course User Index [CF] Hinda Kehr, MD    ____________________________________________  FINAL CLINICAL IMPRESSION(S) / ED DIAGNOSES  Final diagnoses:  Ataxia  Abnormal MRI of head  Urinary tract infection without hematuria, site unspecified     MEDICATIONS GIVEN DURING THIS VISIT:  Medications  acetaminophen (TYLENOL) tablet 650 mg (not administered)    Or  acetaminophen (TYLENOL) suppository 650 mg (not administered)  ondansetron (ZOFRAN) tablet 4 mg (not administered)    Or  ondansetron (ZOFRAN) injection 4 mg (not administered)  albuterol (PROVENTIL) (2.5 MG/3ML) 0.083% nebulizer solution 2.5 mg (not administered)  enoxaparin (LOVENOX) injection 40 mg (not administered)  sodium chloride flush (NS) 0.9 % injection 3 mL (not administered)  sodium chloride flush (NS) 0.9 % injection 3 mL (not administered)  0.9 %  sodium chloride infusion (not  administered)  0.9 %  sodium chloride infusion (not administered)  HYDROcodone-acetaminophen (NORCO/VICODIN) 5-325 MG per tablet 1-2 tablet (not administered)  senna-docusate (Senokot-S) tablet 1 tablet (not administered)  bisacodyl (DULCOLAX) EC tablet 5 mg (not administered)  DULoxetine (CYMBALTA) DR capsule 60 mg (not administered)  folic acid (FOLVITE) tablet 1 mg (not administered)  levothyroxine (SYNTHROID, LEVOTHROID) tablet 50 mcg (not administered)  Magnesium CAPS 1 capsule (not administered)  insulin aspart (novoLOG) injection 0-9 Units (not administered)  insulin aspart (novoLOG) injection 0-5 Units (not administered)  cephALEXin (KEFLEX) capsule 500 mg (not administered)     NEW OUTPATIENT MEDICATIONS STARTED DURING THIS VISIT:  This SmartLink is deprecated. Use AVSMEDLIST instead to display the medication list for a patient.  This SmartLink is deprecated. Use AVSMEDLIST instead to display the medication list for a patient.  This SmartLink is deprecated. Use AVSMEDLIST instead to display the medication list for a patient.   Note:  This document was prepared using Dragon voice recognition software and may include unintentional dictation errors.    Hinda Kehr, MD 04/28/17 2105

## 2017-04-28 NOTE — ED Notes (Signed)
Meal tray provided per pt request.  

## 2017-04-28 NOTE — ED Triage Notes (Signed)
C/O dizziness and nausea.  Patient has had same complaints for over a month and is being treated for same by PCP.  Went to [facility] today, who sent patient to ED for evaluation.

## 2017-04-28 NOTE — ED Notes (Signed)
Patient fell in lobby.  Friend was present during fall.  Patient states she tripped on her blanket while getting into her wheelchair.  Patient reports that she has felt dizzy and lightheaded but did not lose consciousness.  Patient is complaining of left thigh pain.  Patient's friend states she believes she hit her forehead when she fell, patient denies this.  Charge RN notified of fall.

## 2017-04-28 NOTE — ED Notes (Signed)
MRI ready for pt. Glenda EDT to transport.

## 2017-04-29 ENCOUNTER — Inpatient Hospital Stay: Payer: Medicare Other

## 2017-04-29 ENCOUNTER — Telehealth: Payer: Self-pay

## 2017-04-29 DIAGNOSIS — R930 Abnormal findings on diagnostic imaging of skull and head, not elsewhere classified: Secondary | ICD-10-CM

## 2017-04-29 LAB — BASIC METABOLIC PANEL
Anion gap: 8 (ref 5–15)
BUN: 12 mg/dL (ref 6–20)
CALCIUM: 8.3 mg/dL — AB (ref 8.9–10.3)
CO2: 26 mmol/L (ref 22–32)
Chloride: 99 mmol/L — ABNORMAL LOW (ref 101–111)
Creatinine, Ser: 0.53 mg/dL (ref 0.44–1.00)
GFR calc Af Amer: >60 mL/min (ref >60)
GLUCOSE: 119 mg/dL — AB (ref 65–99)
POTASSIUM: 3.6 mmol/L (ref 3.5–5.1)
Sodium: 133 mmol/L — ABNORMAL LOW (ref 135–145)

## 2017-04-29 LAB — CBC
HEMATOCRIT: 38.1 % (ref 35.0–47.0)
Hemoglobin: 13.1 g/dL (ref 12.0–16.0)
MCH: 33.2 pg (ref 26.0–34.0)
MCHC: 34.5 g/dL (ref 32.0–36.0)
MCV: 96.1 fL (ref 80.0–100.0)
Platelets: 361 10*3/uL (ref 150–440)
RBC: 3.96 MIL/uL (ref 3.80–5.20)
RDW: 14.2 % (ref 11.5–14.5)
WBC: 9.8 10*3/uL (ref 3.6–11.0)

## 2017-04-29 LAB — GLUCOSE, CAPILLARY
GLUCOSE-CAPILLARY: 121 mg/dL — AB (ref 65–99)
GLUCOSE-CAPILLARY: 109 mg/dL — AB (ref 65–99)
Glucose-Capillary: 117 mg/dL — ABNORMAL HIGH (ref 65–99)
Glucose-Capillary: 156 mg/dL — ABNORMAL HIGH (ref 65–99)

## 2017-04-29 MED ORDER — METOPROLOL TARTRATE 5 MG/5ML IV SOLN
5.0000 mg | INTRAVENOUS | Status: DC | PRN
  Administered 2017-04-30 – 2017-05-04 (×3): 5 mg via INTRAVENOUS
  Filled 2017-04-29 (×3): qty 5

## 2017-04-29 MED ORDER — GADOBENATE DIMEGLUMINE 529 MG/ML IV SOLN
10.0000 mL | Freq: Once | INTRAVENOUS | Status: AC | PRN
  Administered 2017-04-29: 10 mL via INTRAVENOUS

## 2017-04-29 MED ORDER — ENSURE ENLIVE PO LIQD
237.0000 mL | Freq: Three times a day (TID) | ORAL | Status: DC
  Administered 2017-04-29 – 2017-05-03 (×10): 237 mL via ORAL

## 2017-04-29 MED ORDER — ADULT MULTIVITAMIN W/MINERALS CH
1.0000 | ORAL_TABLET | Freq: Every day | ORAL | Status: DC
Start: 2017-04-30 — End: 2017-05-04
  Administered 2017-04-30 – 2017-05-03 (×4): 1 via ORAL
  Filled 2017-04-29 (×4): qty 1

## 2017-04-29 NOTE — Evaluation (Signed)
Physical Therapy Evaluation Patient Details Name: Renee Cowan MRN: [id number] DOB: [birth year] Today's Date: 04/29/2017   History of Present Illness  Pt is a 71 y/o F who presented with worsening dizziness and imbalance with a fall in the ED.  She additionally was experiencing blurred vision, nausea, and vomiting.  The pt has a h/o chronic vertigo and has seen her ENT for this (was told it was viral in origin) and has been going to PT for her imbalance.  MRI of the brain completed and suspecting lymphoma.  Pt's PMH inculdes concussion, fibromyalgia, osteoporosis, RA.      Clinical Impression  Pt admitted with above diagnosis. Pt currently with functional limitations due to the deficits listed below (see PT Problem List). [name] presents with delayed responses and provides contradicting information for her home layout when answering questions.  She tells this therapist that she has not had any falls in the past 6 months but her friend tells this therapist in private that she has had 6 falls in the past 6 months.  She is very impulsive and almost no safety awareness.  She acknowledges that her balance is impaired but does not take any safe measures with OOB activity and stumbles L and R, requiring min assist and use of railing in hallway or countertop in room to prevent LOB.  Recommending Speech Consult to address cognition.  As pt is at a high risk of falling, recommending that she has 24/7 assist/supervision at d/c.  Also question pt's safety with ADLs involving a higher risk (using the over or stove) and pt's ability to respond in emergency situations in the home. Pt will benefit from skilled PT to increase their independence and safety with mobility to allow discharge to the venue listed below.      Follow Up Recommendations Outpatient PT;Supervision/Assistance - 24 hour    Equipment Recommendations  Rolling walker with 5" wheels    Recommendations for Other Services Speech  consult(Cognitive eval )     Precautions / Restrictions Precautions Precautions: Fall;Other (comment) Precaution Comments: Monitor BP Restrictions Weight Bearing Restrictions: No      Mobility  Bed Mobility Overal bed mobility: Needs Assistance Bed Mobility: Supine to Sit;Sit to Supine     Supine to sit: Min guard Sit to supine: Supervision   General bed mobility comments: Pt impulsive and moves quickly without regard to IV line.  She appears confused when this PT suggests to scoot forward toward EOB to have feet supported for improved balance (pt with mild posterior lean).   Transfers Overall transfer level: Needs assistance Equipment used: None Transfers: Sit to/from Stand Sit to Stand: Min assist         General transfer comment: Pt requires min assist to steady as she stands quickly and immediately reaches for counterop for support.   Ambulation/Gait Ambulation/Gait assistance: Min assist Ambulation Distance (Feet): 160 Feet Assistive device: None Gait Pattern/deviations: Step-through pattern;Staggering left;Staggering right;Narrow base of support   Gait velocity interpretation: at or above normal speed for age/gender General Gait Details: Pt is impulsive and does not appear to make any adjustments with her mobility despite her imbalance.  She continues to be impulsive even with cues to be careful and slow down to avoid pulling out IV.  She ambulates with a narrow BOS with frequent LOB L and R, reaching out for the railing in the hallway and requiring min assist to prevent fall.   Stairs  Wheelchair Mobility    Modified Rankin (Stroke Patients Only)       Balance Overall balance assessment: Needs assistance;History of Falls Sitting-balance support: No upper extremity supported;Feet supported Sitting balance-Leahy Scale: Fair Sitting balance - Comments: Mild posterior lean noted   Standing balance support: No upper extremity supported;During  functional activity Standing balance-Leahy Scale: Fair Standing balance comment: Pt able to stand statically with sway but no LOB.  However, she requires min assist with any dynamic activity             High level balance activites: Head turns High Level Balance Comments: Pt with LOB with vertical and horizontal head turns Standardized Balance Assessment Standardized Balance Assessment : Berg Balance Test Berg Balance Test Sit to Stand: Able to stand  independently using hands Standing Unsupported: Able to stand 2 minutes with supervision Sitting with Back Unsupported but Feet Supported on Floor or Stool: Able to sit safely and securely 2 minutes Stand to Sit: Controls descent by using hands Transfers: Able to transfer safely, definite need of hands Standing Unsupported with Eyes Closed: Able to stand 10 seconds with supervision Standing Ubsupported with Feet Together: Able to place feet together independently but unable to hold for 30 seconds Standing Unsupported, One Foot in Front: Loses balance while stepping or standing Standing on One Leg: Unable to try or needs assist to prevent fall         Pertinent Vitals/Pain Pain Assessment: No/denies pain    Home Living Family/patient expects to be discharged to:: Private residence Living Arrangements: Alone Available Help at Discharge: Friend(s);Available PRN/intermittently(no family nearby) Type of Home: House Home Access: Stairs to enter Entrance Stairs-Rails: Left Entrance Stairs-Number of Steps: ("several") Home Layout: One level Home Equipment: Cane - single point      Prior Function Level of Independence: Independent with assistive device(s)         Comments: Pt reports she has recent begun using cane intermittently due to instability.  Pt denies any falls in the past 6 months but pt's friends discolses in private to this PT that the pt has had 6 falls in the past 6 months.  Pt is a poor historian, at times providing  information about home layout that contradicts information already provided.  Pt reports she is independent with all ADLs, is still driving.  Has been seeing a PT at her ENT office to address her balance.  Has seen an ENT for her vertigo and has been told that it is due to a viral infection in her L ear.      Hand Dominance        Extremity/Trunk Assessment   Upper Extremity Assessment Upper Extremity Assessment: Overall WFL for tasks assessed    Lower Extremity Assessment Lower Extremity Assessment: Overall WFL for tasks assessed       Communication   Communication: No difficulties  Cognition Arousal/Alertness: Awake/alert Behavior During Therapy: Impulsive Overall Cognitive Status: Impaired/Different from baseline Area of Impairment: Orientation;Following commands;Safety/judgement                 Orientation Level: Disoriented to;Place(believes she is at [facility])     Following Commands: Follows multi-step commands inconsistently Safety/Judgement: Decreased awareness of safety;Decreased awareness of deficits     General Comments: Pt impulsive and moves quickly with all aspects of mobility despite cues to slow down and be careful not to pull out IV.  Pt has poor awareness of her deficits.  She is acknowledges that her balance is impaired but  does not adjust her safety with mobility.       General Comments General comments (skin integrity, edema, etc.): BP supine at end of session 183/101, RN notified. Pt's mood shifting throughout the session from very pleasant to irritated, thus unable to complete Berg Balance Test in it's entirety. Pt successful with three word recall from middle to end of session.     Exercises     Assessment/Plan    PT Assessment Patient needs continued PT services  PT Problem List Decreased balance;Decreased cognition;Decreased knowledge of use of DME;Decreased safety awareness       PT Treatment Interventions DME instruction;Gait  training;Stair training;Functional mobility training;Therapeutic activities;Therapeutic exercise;Balance training;Neuromuscular re-education;Cognitive remediation;Patient/family education    PT Goals (Current goals can be found in the Care Plan section)  Acute Rehab PT Goals Patient Stated Goal: to get some rest PT Goal Formulation: With patient Time For Goal Achievement: 05/13/17 Potential to Achieve Goals: Good    Frequency Min 2X/week   Barriers to discharge Decreased caregiver support Pt lives alone    Co-evaluation               AM-PAC PT "6 Clicks" Daily Activity  Outcome Measure Difficulty turning over in bed (including adjusting bedclothes, sheets and blankets)?: None Difficulty moving from lying on back to sitting on the side of the bed? : None Difficulty sitting down on and standing up from a chair with arms (e.g., wheelchair, bedside commode, etc,.)?: Unable Help needed moving to and from a bed to chair (including a wheelchair)?: A Little Help needed walking in hospital room?: A Little Help needed climbing 3-5 steps with a railing? : A Little 6 Click Score: 18    End of Session Equipment Utilized During Treatment: Gait belt Activity Tolerance: Patient tolerated treatment well;Treatment limited secondary to medical complications (Comment)(hypertension) Patient left: in bed;with call bell/phone within reach;with bed alarm set;with family/visitor present Nurse Communication: Mobility status;Other (comment)(BP reading) PT Visit Diagnosis: Unsteadiness on feet (R26.81);History of falling (Z91.81);Other abnormalities of gait and mobility (R26.89)    Time: 8144-8185 PT Time Calculation (min) (ACUTE ONLY): 26 min   Charges:   PT Evaluation $PT Eval Moderate Complexity: 1 Mod PT Treatments $Gait Training: 8-22 mins   PT G Codes:   PT G-Codes **NOT FOR INPATIENT CLASS** Functional Assessment Tool Used: AM-PAC 6 Clicks Basic Mobility;Clinical judgement Functional  Limitation: Mobility: Walking and moving around Mobility: Walking and Moving Around Current Status (U3149): At least 40 percent but less than 60 percent impaired, limited or restricted Mobility: Walking and Moving Around Goal Status [phone]): At least 20 percent but less than 40 percent impaired, limited or restricted    Collie Siad PT, DPT 04/29/2017, 1:26 PM

## 2017-04-29 NOTE — Progress Notes (Signed)
PT Cancellation Note  Patient Details Name: Renee Cowan MRN: [id number] DOB: 1945/12/19   Cancelled Treatment:    Reason Eval/Treat Not Completed: Patient at procedure or test/unavailable(currently off floor for MRI).  Will attempt to see pt again later today, schedule permitting.    Collie Siad PT, DPT 04/29/2017, 9:18 AM

## 2017-04-29 NOTE — Progress Notes (Signed)
Initial Nutrition Assessment  DOCUMENTATION CODES:   Non-severe (moderate) malnutrition in context of chronic illness  INTERVENTION:  Recommend liberalizing diet to regular.  Provide Ensure Enlive po TID, each supplement provides 350 kcal and 20 grams of protein.  Provide multivitamin with minerals daily.  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   -Per MRI brain today with contrast, CNS lymphoma strongly favored.  Met with patient at bedside. She reports she has had a decreased appetite for a while now. She is feeling down at time of assessment and not able to provide a very thorough history. She reports that shortly before lunch today she was told she has brain cancer. Therefore she only ate a few bites of her lunch. She reports that she lives alone at home and has trouble preparing well-balanced meals for herself. She will typically eat easy meals, but is unable to describe what those might be. She reports she has had nausea when she eats too little or too much. Patient reports she does not have diabetes and does not want any insulin.  Patient reports UBW 130 lbs. RD obtained bed scale weight of 109.9 lbs. Per chart patient was 123.6 lbs on 12/03/2016. She has lost 13.7 lbs (11.1% body weight) over 5 months, which is significant for time frame.  Medications reviewed and  include: folic acid 1 mg daily, Novolog 0-9 units TID, Novolog 0-5 units QHS, levothyroxine, magnesium oxide 400 mg daily, NS @ 75 mL/hr.  Labs reviewed: CBG 117-139, Sodium 133, Chloride 99.  Discussed with RN.  NUTRITION - FOCUSED PHYSICAL EXAM:    Most Recent Value  Orbital Region  Moderate depletion  Upper Arm Region  Moderate depletion  Thoracic and Lumbar Region  Moderate depletion  Buccal Region  Moderate depletion  Temple Region  Severe depletion  Clavicle Bone Region  Moderate depletion  Clavicle and Acromion Bone Region  Moderate depletion  Scapular Bone Region  Moderate depletion  Dorsal Hand  Moderate depletion  Patellar Region  Moderate depletion  Anterior Thigh Region  Moderate depletion  Posterior Calf Region  Moderate depletion  Edema (RD Assessment)  None  Hair  Reviewed  Eyes  Reviewed  Mouth  Reviewed  Skin  Reviewed  Nails  Reviewed     Diet Order:  Diet heart healthy/carb modified Room service appropriate? Yes; Fluid consistency: Thin  EDUCATION NEEDS:   Not appropriate for education at this time  Skin:  Skin Assessment: Reviewed RN Assessment  Last BM:  PTA (04/27/2017 per chart)  Height:   Ht Readings from Last 1 Encounters:  04/28/17 5' 2"  (1.575 m)    Weight:   Wt Readings from Last 1 Encounters:  04/29/17 109 lb 14.4 oz (49.9 kg)    Ideal Body Weight:  50 kg  BMI:  Body mass index is 20.1 kg/m.  Estimated Nutritional Needs:   Kcal:  1500-1750 (30-35 kcal/kg)  Protein:  65-75 grams (1.3-1.5  grams/kg)  Fluid:  1.5-1.75 L/day (30-35 mL/kg)  Willey Blade, MS, RD, LDN Office: [phone] Pager: [phone] After Hours/Weekend Pager: [phone]

## 2017-04-29 NOTE — Consult Note (Signed)
Reason for Consult:Abnormal MRI Referring Physician: Gouru  CC: Worsening dizziness and gait  HPI: Renee Cowan is an 71 y.o. female who reports that for the past month or so she has been dizzy and off balance.  She has a history of vertigo and has been seen by ENT and started on vestibular exercises.  Patient had acute worsening of her symptoms on the day of presentation.  Dizziness and difficulty with gait became more severe.  Patient presented for evaluation.    Past Medical History:  Diagnosis Date  . Allergy   . Cataract   . Concussion   . Depression   . Fibromyalgia   . Hyperlipidemia   . Hypertension   . Hypothyroidism   . Osteoporosis   . Rheumatoid arteritis   . Trigger finger   . Vitamin D deficiency     Past Surgical History:  Procedure Laterality Date  . CATARACT EXTRACTION, BILATERAL Bilateral [id number] and [id number]  . CHOLECYSTECTOMY    . CYST REMOVAL TRUNK    . melaoma excesion    . trigger thumb release      Family History  Problem Relation Age of Onset  . Cancer Mother        cervical  . Tuberculosis Father   . Kidney cancer Neg Hx   . Bladder Cancer Neg Hx     Social History:  reports that she has been smoking cigarettes.  She started smoking about 40 years ago. She has a 20.00 pack-year smoking history. she has never used smokeless tobacco. She reports that she drinks alcohol. She reports that she does not use drugs.  Allergies  Allergen Reactions  . Penicillins Rash  . Sulfa Antibiotics Rash    Medications:  I have reviewed the patient's current medications. Prior to Admission:  Medications Prior to Admission  Medication Sig Dispense Refill Last Dose  . aspirin (ECOTRIN LOW STRENGTH) 81 MG EC tablet ECOTRIN LOW STRENGTH, 81MG  (Oral Tablet Delayed Release)  1 po qday for 0 days  Quantity: 30.00;  Refills: 0   Ordered :17-Aug-2010  Steele Sizer MD;  Started 20-Sep-2008 Active   04/27/2017 at Unknown time  . Calcium  Carb-Cholecalciferol (CALCIUM + D3) 600-200 MG-UNIT TABS Take by mouth.   04/27/2017 at Unknown time  . conjugated estrogens (PREMARIN) vaginal cream Place 1 Applicatorful vaginally daily. Apply 0.5mg  (pea-sized amount)  just inside the vaginal introitus with a finger-tip every night for two weeks and then Monday, Wednesday and Friday nights. 30 g 12 Past Week at Unknown time  . cyanocobalamin 500 MCG tablet Take 500 mcg by mouth daily.   04/27/2017 at Unknown time  . cyclobenzaprine (FLEXERIL) 10 MG tablet Take 1 tablet (10 mg total) by mouth 3 (three) times daily as needed for muscle spasms. 30 tablet 0 Past Month at Unknown time  . DULoxetine (CYMBALTA) 60 MG capsule Take 1 capsule (60 mg total) by mouth daily. 90 capsule 1 04/27/2017 at Unknown time  . folic acid (FOLVITE) 1 MG tablet Take 1 mg by mouth daily.  1 04/27/2017 at Unknown time  . levothyroxine (SYNTHROID, LEVOTHROID) 50 MCG tablet TAKE 1 TABLET BY MOUTH  DAILY BEFORE BREAKFAST 90 tablet 1 04/27/2017 at Unknown time  . Magnesium 400 MG CAPS Take 1 capsule by mouth daily. 30 capsule 0 04/27/2017 at Unknown time  . meloxicam (MOBIC) 15 MG tablet Take 1 tablet (15 mg total) by mouth daily. (Patient taking differently: Take 15 mg by mouth as needed. ) 90 tablet 1  Past Month at Unknown time  . methotrexate (RHEUMATREX) 2.5 MG tablet once a week.   0 04/27/2017 at Unknown time   Scheduled: . cephALEXin  500 mg Oral Q12H  . DULoxetine  60 mg Oral Daily  . enoxaparin (LOVENOX) injection  40 mg Subcutaneous Q24H  . folic acid  1 mg Oral Daily  . insulin aspart  0-5 Units Subcutaneous QHS  . insulin aspart  0-9 Units Subcutaneous TID WC  . levothyroxine  50 mcg Oral QAC breakfast  . magnesium oxide  400 mg Oral Daily  . sodium chloride flush  3 mL Intravenous Q12H    ROS: History obtained from the patient  General ROS: negative for - chills, fatigue, fever, night sweats, weight gain or weight loss Psychological ROS: negative for -  behavioral disorder, hallucinations, memory difficulties, mood swings or suicidal ideation Ophthalmic ROS: negative for - blurry vision, double vision, eye pain or loss of vision ENT ROS: as noted in HPI Allergy and Immunology ROS: negative for - hives or itchy/watery eyes Hematological and Lymphatic ROS: negative for - bleeding problems, bruising or swollen lymph nodes Endocrine ROS: negative for - galactorrhea, hair pattern changes, polydipsia/polyuria or temperature intolerance Respiratory ROS: negative for - cough, hemoptysis, shortness of breath or wheezing Cardiovascular ROS: negative for - chest pain, dyspnea on exertion, edema or irregular heartbeat Gastrointestinal ROS: negative for - abdominal pain, diarrhea, hematemesis, nausea/vomiting or stool incontinence Genito-Urinary ROS: negative for - dysuria, hematuria, incontinence or urinary frequency/urgency Musculoskeletal ROS: negative for - joint swelling or muscular weakness Neurological ROS: as noted in HPI Dermatological ROS: negative for rash and skin lesion changes  Physical Examination: Blood pressure (!) 181/86, pulse 90, temperature 98.2 F (36.8 C), resp. rate 18, height 5\' 2"  (1.575 m), weight 51.3 kg (113 lb), SpO2 95 %.  HEENT-  Normocephalic, no lesions, without obvious abnormality.  Normal external eye and conjunctiva.  Normal TM's bilaterally.  Normal auditory canals and external ears. Normal external nose, mucus membranes and septum.  Normal pharynx. Cardiovascular- S1, S2 normal, pulses palpable throughout   Lungs- chest clear, no wheezing, rales, normal symmetric air entry Abdomen- soft, non-tender; bowel sounds normal; no masses,  no organomegaly Extremities- no edema Lymph-no adenopathy palpable Musculoskeletal-no joint tenderness, deformity or swelling Skin-warm and dry, no hyperpigmentation, vitiligo, or suspicious lesions  Neurological Examination  Mental Status: Alert, oriented, thought content  appropriate.  Speech fluent without evidence of aphasia.  Able to follow 3 step commands without difficulty. Cranial Nerves: II: Discs flat bilaterally; Visual fields grossly normal, pupils equal, round, reactive to light and accommodation III,IV, VI: ptosis not present, extra-ocular motions intact bilaterally with horizontal nystagmus in all positions, most dramatic to the right V,VII: smile symmetric, facial light touch sensation normal bilaterally VIII: hearing normal bilaterally IX,X: gag reflex present XI: bilateral shoulder shrug XII: midline tongue extension Motor: Right : Upper extremity   5/5    Left:     Upper extremity   5/5  Lower extremity   5/5     Lower extremity   5/5 Tone and bulk:normal tone throughout; no atrophy noted Sensory: Pinprick and light touch decreased on the RUE Deep Tendon Reflexes: 2+ and symmetric throughout Plantars: Right: upgoing   Left: equivocal Cerebellar: normal finger-to-nose, normal rapid alternating movements and normal heel-to-shin test Gait: not tested due to safety concerns    Laboratory Studies:   Basic Metabolic Panel: Recent Labs  Lab 04/28/17 1349 04/29/17 0450  NA 133* 133*  K 3.9 3.6  CL 95* 99*  CO2 26 26  GLUCOSE 119* 119*  BUN 12 12  CREATININE 0.78 0.53  CALCIUM 9.6 8.3*    Liver Function Tests: No results for input(s): AST, ALT, ALKPHOS, BILITOT, PROT, ALBUMIN in the last 168 hours. No results for input(s): LIPASE, AMYLASE in the last 168 hours. No results for input(s): AMMONIA in the last 168 hours.  CBC: Recent Labs  Lab 04/28/17 1349 04/29/17 0450  WBC 9.6 9.8  HGB 14.2 13.1  HCT 42.0 38.1  MCV 95.5 96.1  PLT 398 361    Cardiac Enzymes: No results for input(s): CKTOTAL, CKMB, CKMBINDEX, TROPONINI in the last 168 hours.  BNP: Invalid input(s): POCBNP  CBG: Recent Labs  Lab 04/28/17 2242 04/29/17 0746 04/29/17 1149  GLUCAP 139* 117* 121*    Microbiology: Results for orders placed or  performed in visit on 03/25/17  Urine Culture     Status: None   Collection Time: 03/25/17  1:57 PM  Result Value Ref Range Status   MICRO NUMBER: [id number]  Final   SPECIMEN QUALITY: ADEQUATE  Final   Sample Source URINE, CLEAN CATCH  Final   STATUS: FINAL  Final   Result: No Growth  Final    Coagulation Studies: No results for input(s): LABPROT, INR in the last 72 hours.  Urinalysis:  Recent Labs  Lab 04/28/17 1357  COLORURINE YELLOW*  LABSPEC 1.009  PHURINE 8.0  GLUCOSEU NEGATIVE  HGBUR NEGATIVE  BILIRUBINUR NEGATIVE  KETONESUR NEGATIVE  PROTEINUR NEGATIVE  NITRITE NEGATIVE  LEUKOCYTESUR MODERATE*    Lipid Panel:     Component Value Date/Time   CHOL 165 02/11/2017 0807   CHOL 205 (H) 11/24/2015 0936   TRIG 114 02/11/2017 0807   HDL 31 (L) 02/11/2017 0807   HDL 33 (L) 11/24/2015 0936   CHOLHDL 5.3 (H) 02/11/2017 0807   VLDL 23 02/11/2017 0807   LDLCALC 111 (H) 02/11/2017 0807   LDLCALC 132 (H) 11/24/2015 0936    HgbA1C:  Lab Results  Component Value Date   HGBA1C 5.3 12/03/2016    Urine Drug Screen:  No results found for: LABOPIA, COCAINSCRNUR, LABBENZ, AMPHETMU, THCU, LABBARB  Alcohol Level: No results for input(s): ETH in the last 168 hours.  Other results: EKG: sinus rhythm at 85 bpm.  Imaging: Mr Angiogram Head Wo Contrast  Result Date: 04/28/2017 CLINICAL DATA:  Ataxia.  Stroke suspected.  Subacute neuro deficit. EXAM: MRI HEAD WITHOUT CONTRAST MRA HEAD WITHOUT CONTRAST TECHNIQUE: Multiplanar, multiecho pulse sequences of the brain and surrounding structures were obtained without intravenous contrast. Angiographic images of the head were obtained using MRA technique without contrast. COMPARISON:  MRI brain 10/28/2015 FINDINGS: MRI HEAD FINDINGS Brain: There is extensive progression of diffuse white matter disease. Masslike T2 change is present about the atrium of the right lateral ventricle. There is 5 mm rim of T2 hyperintense tissue extending into  the splenium of the corpus callosum with mild restricted diffusion and surrounding T2 signal. Similar signal changes of heterogeneous T2 signal and restricted diffusion are present adjacent to the fourth ventricle involving the inferior vermis and medial right cerebellum. There is also slight restricted diffusion involving the posterior limb of the left internal capsule. No acute hemorrhage or mass lesion is present. Atrophy has progressed over the last 18 months. The ventricles are proportionate to the degree of atrophy. No significant extra-axial fluid collection is present. The internal auditory canals are within normal limits. White matter changes extend into the brainstem. Vascular: Flow is present in  the major intracranial arteries. Skull and upper cervical spine: The skullbase is within normal limits. The craniocervical junction is normal. Marrow signal is normal. Midline sagittal structures are otherwise unremarkable. Sinuses/Orbits: A polyp or mucous retention cyst is node along the inferior aspect of the right maxillary sinus. The paranasal sinuses and mastoid air cells are otherwise clear. The globes and orbits are within normal limits. MRA HEAD FINDINGS MRA is degraded by patient motion. This limits evaluation of distal vessels. Mild irregularity is present in the cavernous internal carotid arteries without a significant stenosis to the ICA terminus. A1 and M1 segments are normal bilaterally. MCA bifurcations are intact. Attenuation of distal MCA and ACA branch vessels is likely exaggerated by patient motion. Pain right vertebral artery is dominant. Signal loss in the vertebral arteries bilaterally is artifactual. Distal PICA vessels are seen. The basilar artery is normal. There is asymmetric attenuation of the proximal left P1 segment. Distal PCA branch vessels are attenuated bilaterally. IMPRESSION: 1. Focal subependymal T2 signal change about the atrium of the right lateral ventricle with restricted  diffusion. This raises concern for lymphoma or focal subependymal infection. The latter is considered less likely given the lack of disease elsewhere. An aggressive demyelinating process is considered less likely. Vasculitis is also considered. Question HIV or other immune mediated disease. 2. Similar findings are present along the right side of the fourth ventricle involving the inferior vermis and medial right cerebellum. 3. T2 change and restricted diffusion in the posterior limb of the left internal capsule. This may be related to the same process or subacute infarct. 4. Significant progression of diffuse white matter disease since the prior exam. This is likely related to small-vessel ischemia. 5. Narrowing of proximal left posterior cerebral artery. 6. No other significant proximal stenosis or branch vessel occlusions. 7. Evaluation of distal vessels is degraded by significant patient motion. MRI the brain with contrast would be useful for further refined aunt of the differential diagnosis. Lumbar puncture may be useful further evaluation as well given the ventricular involvement. These results were called by telephone at the time of interpretation on 04/28/2017 at 8:09 pm to Dr. Hinda Kehr , who verbally acknowledged these results. Electronically Signed   By: San Morelle M.D.   On: 04/28/2017 20:11   Mr Brain Wo Contrast  Result Date: 04/28/2017 CLINICAL DATA:  Ataxia.  Stroke suspected.  Subacute neuro deficit. EXAM: MRI HEAD WITHOUT CONTRAST MRA HEAD WITHOUT CONTRAST TECHNIQUE: Multiplanar, multiecho pulse sequences of the brain and surrounding structures were obtained without intravenous contrast. Angiographic images of the head were obtained using MRA technique without contrast. COMPARISON:  MRI brain 10/28/2015 FINDINGS: MRI HEAD FINDINGS Brain: There is extensive progression of diffuse white matter disease. Masslike T2 change is present about the atrium of the right lateral ventricle.  There is 5 mm rim of T2 hyperintense tissue extending into the splenium of the corpus callosum with mild restricted diffusion and surrounding T2 signal. Similar signal changes of heterogeneous T2 signal and restricted diffusion are present adjacent to the fourth ventricle involving the inferior vermis and medial right cerebellum. There is also slight restricted diffusion involving the posterior limb of the left internal capsule. No acute hemorrhage or mass lesion is present. Atrophy has progressed over the last 18 months. The ventricles are proportionate to the degree of atrophy. No significant extra-axial fluid collection is present. The internal auditory canals are within normal limits. White matter changes extend into the brainstem. Vascular: Flow is present in the major intracranial  arteries. Skull and upper cervical spine: The skullbase is within normal limits. The craniocervical junction is normal. Marrow signal is normal. Midline sagittal structures are otherwise unremarkable. Sinuses/Orbits: A polyp or mucous retention cyst is node along the inferior aspect of the right maxillary sinus. The paranasal sinuses and mastoid air cells are otherwise clear. The globes and orbits are within normal limits. MRA HEAD FINDINGS MRA is degraded by patient motion. This limits evaluation of distal vessels. Mild irregularity is present in the cavernous internal carotid arteries without a significant stenosis to the ICA terminus. A1 and M1 segments are normal bilaterally. MCA bifurcations are intact. Attenuation of distal MCA and ACA branch vessels is likely exaggerated by patient motion. Pain right vertebral artery is dominant. Signal loss in the vertebral arteries bilaterally is artifactual. Distal PICA vessels are seen. The basilar artery is normal. There is asymmetric attenuation of the proximal left P1 segment. Distal PCA branch vessels are attenuated bilaterally. IMPRESSION: 1. Focal subependymal T2 signal change about  the atrium of the right lateral ventricle with restricted diffusion. This raises concern for lymphoma or focal subependymal infection. The latter is considered less likely given the lack of disease elsewhere. An aggressive demyelinating process is considered less likely. Vasculitis is also considered. Question HIV or other immune mediated disease. 2. Similar findings are present along the right side of the fourth ventricle involving the inferior vermis and medial right cerebellum. 3. T2 change and restricted diffusion in the posterior limb of the left internal capsule. This may be related to the same process or subacute infarct. 4. Significant progression of diffuse white matter disease since the prior exam. This is likely related to small-vessel ischemia. 5. Narrowing of proximal left posterior cerebral artery. 6. No other significant proximal stenosis or branch vessel occlusions. 7. Evaluation of distal vessels is degraded by significant patient motion. MRI the brain with contrast would be useful for further refined aunt of the differential diagnosis. Lumbar puncture may be useful further evaluation as well given the ventricular involvement. These results were called by telephone at the time of interpretation on 04/28/2017 at 8:09 pm to Dr. Hinda Kehr , who verbally acknowledged these results. Electronically Signed   By: San Morelle M.D.   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 CONTRAST:  84mL MULTIHANCE GADOBENATE DIMEGLUMINE 529 MG/ML IV SOLN COMPARISON:  Noncontrast exam from yesterday FINDINGS: Brain: Homogeneous periventricular enhancement in areas of apparent dense cellularity by precontrast MRI yesterday. There is confluent enhancement about the fourth ventricle and  cerebral aqua duct with nodular components centered on the right dentate and tectum. Minimal base enhancement about the right more than left lateral ventricles greatest posteriorly. Fourth ventricular subependymal enhancement continues inferiorly and laterally along the right foramen of Luschka. A few stippled foci of enhancement are seen in the left corona radiata. No generalized leptomeningeal enhancement is seen. Edematous changes are stable from yesterday. No shift or hydrocephalus. As before, constellation of findings favors CNS lymphoma. Would expect ventricular debris and different clinical symptomatology if this were bacterial ventriculitis. Other differential considerations were discussed previously. Vascular: Negative Skull and upper cervical spine: Negative Sinuses/Orbits: Bilateral cataract resection. IMPRESSION: Areas of apparent dense cellularity on previous brain MRI show homogeneous enhancement. 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 Electronically Signed   By: Neva Seat.D.  On: 04/29/2017 10:15     Assessment/Plan: 71 year old female presenting with complaints of dizziness and difficulty with gait.  MRI of the brain with and without contrast reviewed and shows lesions suggestive of CNS lymphoma.    Recommendations: 1.  Oncology consult with treatment plan to be developed once their evaluation has been performed.    Alexis Goodell, MD Neurology [phone] 04/29/2017, 2:00 PM

## 2017-04-29 NOTE — Consult Note (Signed)
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   History of presenting illness-  Patient is a 71 year old female with a known history of hypertension diabetes rheumatoid arthritis, fibromyalgia, hypothyroidism, hyperlipidemia who presented to the emergency room for evaluation of worsening gait instability/ataxia. Patient also complained generalized weakness, worsening dizziness. She also complains of blurred vision, MRI of the brain showed periventricular enhancement suspecting CNS lymphoma. Per note, lumbar puncture was suggested in the emergency room, the patient refused.  I was consulted to see patient in discussion about workup and management plan.  Patient was seen at the bedside. She was sleeping but able to and had a conversation with me. Patient's son had a conversation with hospitalist Dr. Margaretmary Eddy earlier. Patient has already also be evaluated by neurologist Dr. Doy Mince.   [place] of systems- Review of Systems  Constitutional: Positive for malaise/fatigue.  Eyes: Positive for blurred vision. Negative for photophobia, pain and discharge.  Respiratory: Negative for cough and hemoptysis.   Cardiovascular: Positive for leg swelling. Negative for chest pain, palpitations, orthopnea and claudication.  Gastrointestinal: Positive for nausea. Negative for diarrhea and vomiting.  Genitourinary: Negative for dysuria.  Musculoskeletal: Negative for myalgias.  Skin: Negative for itching.  Neurological: Positive for dizziness and weakness.       Unsteady gait.   Endo/Heme/Allergies: Bruises/bleeds easily.    Allergies  Allergen Reactions  . Penicillins  Rash  . Sulfa Antibiotics Rash    Patient Active Problem List   Diagnosis Date Noted  . 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 GAD (generalized anxiety disorder) 04/25/2017  . Moderate recurrent major depression ([place]) 04/25/2017  . Type 2 diabetes mellitus with microalbuminuria, without long-term current use of insulin ([place]) 11/28/2015  . Benign essential HTN 05/26/2015  . Depression, major, in remission ([place]) 05/26/2015  . Dyslipidemia 05/26/2015  . Type 2 diabetes mellitus with renal manifestations, controlled ([place]) 05/26/2015  . Fibromyalgia syndrome 05/26/2015  . Personal history of malignant melanoma of skin 05/26/2015  . Adult hypothyroidism 05/26/2015  . Osteopenia after menopause 05/26/2015  . Tobacco use 05/26/2015  . Vitamin D deficiency 05/26/2015  . Rheumatoid arthritis ([place]) 05/26/2015  . Acquired trigger finger 10/11/2007     Past Medical History:  Diagnosis Date  . Allergy   . Cataract   . Concussion   . Depression   . Fibromyalgia   . Hyperlipidemia   . Hypertension   . Hypothyroidism   . Osteoporosis   . Rheumatoid arteritis   . Trigger finger   . Vitamin D deficiency      Past Surgical History:  Procedure Laterality Date  . CATARACT EXTRACTION, BILATERAL Bilateral [id number] and [id number]  . CHOLECYSTECTOMY    . CYST REMOVAL TRUNK    . melaoma excesion    . trigger thumb release      Social History   Socioeconomic History  . Marital status: Married    Spouse name: Not on file  . Number of children: Not on file  . Years of education: Not on file  . Highest education level: Not on file  Social Needs  . Financial resource strain: Not on file  . Food insecurity - worry: Not on file  . Food insecurity - inability: Not on  file  . Transportation needs - medical: Not on file  . Transportation needs - non-medical: Not on file  Occupational History  . Not on file  Tobacco Use  . Smoking status: Current Every Day Smoker    Packs/day: 0.50    Years:  40.00    Pack years: 20.00    Types: Cigarettes    Start date: 12/03/1976  . Smokeless tobacco: Never Used  Substance and Sexual Activity  . Alcohol use: Yes    Alcohol/week: 0.0 oz    Comment: occasionally  . Drug use: No  . Sexual activity: No  Other Topics Concern  . Not on file  Social History Narrative  . Not on file     Family History  Problem Relation Age of Onset  . Cancer Mother        cervical  . Tuberculosis Father   . Kidney cancer Neg Hx   . Bladder Cancer Neg Hx      Current Facility-Administered Medications:  .  0.9 %  sodium chloride infusion, 250 mL, Intravenous, PRN, Demetrios Loll, MD .  0.9 %  sodium chloride infusion, , Intravenous, Continuous, Demetrios Loll, MD, Last Rate: 75 mL/hr at 04/29/17 1234 .  acetaminophen (TYLENOL) tablet 650 mg, 650 mg, Oral, Q6H PRN, 650 mg at 04/29/17 0334 **OR** acetaminophen (TYLENOL) suppository 650 mg, 650 mg, Rectal, Q6H PRN, Demetrios Loll, MD .  albuterol (PROVENTIL) (2.5 MG/3ML) 0.083% nebulizer solution 2.5 mg, 2.5 mg, Nebulization, Q2H PRN, Demetrios Loll, MD .  bisacodyl (DULCOLAX) EC tablet 5 mg, 5 mg, Oral, Daily PRN, Demetrios Loll, MD .  cephALEXin [facility]) capsule 500 mg, 500 mg, Oral, Q12H, Hinda Kehr, MD, 500 mg at 04/29/17 1022 .  DULoxetine (CYMBALTA) DR capsule 60 mg, 60 mg, Oral, Daily, Demetrios Loll, MD, 60 mg at 04/29/17 1022 .  enoxaparin (LOVENOX) injection 40 mg, 40 mg, Subcutaneous, Q24H, Demetrios Loll, MD, 40 mg at 04/28/17 2217 .  folic acid (FOLVITE) tablet 1 mg, 1 mg, Oral, Daily, Demetrios Loll, MD, 1 mg at 04/29/17 1022 .  hydrALAZINE (APRESOLINE) injection 10 mg, 10 mg, Intravenous, Q6H PRN, Demetrios Loll, MD, 10 mg at 04/28/17 2140 .  HYDROcodone-acetaminophen (NORCO/VICODIN) 5-325 MG per tablet 1-2 tablet, 1-2 tablet, Oral, Q4H PRN, Demetrios Loll, MD, 2 tablet at 04/29/17 1307 .  insulin aspart (novoLOG) injection 0-5 Units, 0-5 Units, Subcutaneous, QHS, Demetrios Loll, MD .  insulin aspart (novoLOG) injection 0-9 Units, 0-9  Units, Subcutaneous, TID WC, Demetrios Loll, MD .  levothyroxine (SYNTHROID, LEVOTHROID) tablet 50 mcg, 50 mcg, Oral, QAC breakfast, Demetrios Loll, MD, 50 mcg at 04/29/17 1022 .  magnesium oxide (MAG-OX) tablet 400 mg, 400 mg, Oral, Daily, Demetrios Loll, MD, 400 mg at 04/29/17 1022 .  metoprolol tartrate (LOPRESSOR) injection 5 mg, 5 mg, Intravenous, Q4H PRN, Gouru, Aruna, MD .  ondansetron (ZOFRAN) tablet 4 mg, 4 mg, Oral, Q6H PRN **OR** ondansetron (ZOFRAN) injection 4 mg, 4 mg, Intravenous, Q6H PRN, Demetrios Loll, MD, 4 mg at 04/29/17 0219 .  senna-docusate (Senokot-S) tablet 1 tablet, 1 tablet, Oral, QHS PRN, Demetrios Loll, MD .  sodium chloride flush (NS) 0.9 % injection 3 mL, 3 mL, Intravenous, Q12H, Demetrios Loll, MD, 3 mL at 04/28/17 2220 .  sodium chloride flush (NS) 0.9 % injection 3 mL, 3 mL, Intravenous, PRN, Demetrios Loll, MD   Physical exam:  Vitals:   04/29/17 0515 04/29/17 1349 04/29/17 1427 04/29/17 1530  BP: (!) 158/95 (!) 181/86 (!) 168/66   Pulse: (!) 106  90    Resp: 18 18    Temp: 98.4 F (36.9 C) 98.2 F (36.8 C)    TempSrc: Oral Oral    SpO2: 97% 95%    Weight:    109 lb 14.4 oz (49.9 kg)  Height:       GENERAL:Alert, no distress and comfortable.  EYES: no pallor or icterus OROPHARYNX: no thrush or ulceration; good dentition  NECK: supple, no masses felt LYMPH:  no palpable lymphadenopathy in the cervical, axillary or inguinal regions LUNGS: clear to auscultation and  No wheeze or crackles HEART/CVS: regular rate & rhythm and no murmurs; No lower extremity edema ABDOMEN: abdomen soft, non-tender and normal bowel sounds Musculoskeletal:no cyanosis of digits and no clubbing  PSYCH: alert & oriented x 3  NEURO: Cranial nerves grossly intact. Sensation intact. Gait not checked.  SKIN:  no rashes or significant lesions     CMP Latest Ref Rng & Units 04/29/2017  Glucose 65 - 99 mg/dL 119(H)  BUN 6 - 20 mg/dL 12  Creatinine 0.44 - 1.00 mg/dL 0.53  Sodium 135 - 145 mmol/L 133(L)   Potassium 3.5 - 5.1 mmol/L 3.6  Chloride 101 - 111 mmol/L 99(L)  CO2 22 - 32 mmol/L 26  Calcium 8.9 - 10.3 mg/dL 8.3(L)  Total Protein 6.1 - 8.1 g/dL -  Total Bilirubin 0.2 - 1.2 mg/dL -  Alkaline Phos 33 - 130 U/L -  AST 10 - 35 U/L -  ALT 6 - 29 U/L -   CBC Latest Ref Rng & Units 04/29/2017  WBC 3.6 - 11.0 K/uL 9.8  Hemoglobin 12.0 - 16.0 g/dL 13.1  Hematocrit 35.0 - 47.0 % 38.1  Platelets 150 - 440 K/uL 361    @IMAGES @  Mr Angiogram Head Wo Contrast  Result Date: 04/28/2017 CLINICAL DATA:  Ataxia.  Stroke suspected.  Subacute neuro deficit. EXAM: MRI HEAD WITHOUT CONTRAST MRA HEAD WITHOUT CONTRAST TECHNIQUE: Multiplanar, multiecho pulse sequences of the brain and surrounding structures were obtained without intravenous contrast. Angiographic images of the head were obtained using MRA technique without contrast. COMPARISON:  MRI brain 10/28/2015 FINDINGS: MRI HEAD FINDINGS Brain: There is extensive progression of diffuse white matter disease. Masslike T2 change is present about the atrium of the right lateral ventricle. There is 5 mm rim of T2 hyperintense tissue extending into the splenium of the corpus callosum with mild restricted diffusion and surrounding T2 signal. Similar signal changes of heterogeneous T2 signal and restricted diffusion are present adjacent to the fourth ventricle involving the inferior vermis and medial right cerebellum. There is also slight restricted diffusion involving the posterior limb of the left internal capsule. No acute hemorrhage or mass lesion is present. Atrophy has progressed over the last 18 months. The ventricles are proportionate to the degree of atrophy. No significant extra-axial fluid collection is present. The internal auditory canals are within normal limits. White matter changes extend into the brainstem. Vascular: Flow is present in the major intracranial arteries. Skull and upper cervical spine: The skullbase is within normal limits. The  craniocervical junction is normal. Marrow signal is normal. Midline sagittal structures are otherwise unremarkable. Sinuses/Orbits: A polyp or mucous retention cyst is node along the inferior aspect of the right maxillary sinus. The paranasal sinuses and mastoid air cells are otherwise clear. The globes and orbits are within normal limits. MRA HEAD FINDINGS MRA is degraded by patient motion. This limits evaluation of distal vessels. Mild irregularity is present in the cavernous internal carotid arteries without a significant  stenosis to the ICA terminus. A1 and M1 segments are normal bilaterally. MCA bifurcations are intact. Attenuation of distal MCA and ACA branch vessels is likely exaggerated by patient motion. Pain right vertebral artery is dominant. Signal loss in the vertebral arteries bilaterally is artifactual. Distal PICA vessels are seen. The basilar artery is normal. There is asymmetric attenuation of the proximal left P1 segment. Distal PCA branch vessels are attenuated bilaterally. IMPRESSION: 1. Focal subependymal T2 signal change about the atrium of the right lateral ventricle with restricted diffusion. This raises concern for lymphoma or focal subependymal infection. The latter is considered less likely given the lack of disease elsewhere. An aggressive demyelinating process is considered less likely. Vasculitis is also considered. Question HIV or other immune mediated disease. 2. Similar findings are present along the right side of the fourth ventricle involving the inferior vermis and medial right cerebellum. 3. T2 change and restricted diffusion in the posterior limb of the left internal capsule. This may be related to the same process or subacute infarct. 4. Significant progression of diffuse white matter disease since the prior exam. This is likely related to small-vessel ischemia. 5. Narrowing of proximal left posterior cerebral artery. 6. No other significant proximal stenosis or branch vessel  occlusions. 7. Evaluation of distal vessels is degraded by significant patient motion. MRI the brain with contrast would be useful for further refined aunt of the differential diagnosis. Lumbar puncture may be useful further evaluation as well given the ventricular involvement. These results were called by telephone at the time of interpretation on 04/28/2017 at 8:09 pm to Dr. Hinda Kehr , who verbally acknowledged these results. Electronically Signed   By: San Morelle M.D.   On: 04/28/2017 20:11   Mr Brain Wo Contrast  Result Date: 04/28/2017 CLINICAL DATA:  Ataxia.  Stroke suspected.  Subacute neuro deficit. EXAM: MRI HEAD WITHOUT CONTRAST MRA HEAD WITHOUT CONTRAST TECHNIQUE: Multiplanar, multiecho pulse sequences of the brain and surrounding structures were obtained without intravenous contrast. Angiographic images of the head were obtained using MRA technique without contrast. COMPARISON:  MRI brain 10/28/2015 FINDINGS: MRI HEAD FINDINGS Brain: There is extensive progression of diffuse white matter disease. Masslike T2 change is present about the atrium of the right lateral ventricle. There is 5 mm rim of T2 hyperintense tissue extending into the splenium of the corpus callosum with mild restricted diffusion and surrounding T2 signal. Similar signal changes of heterogeneous T2 signal and restricted diffusion are present adjacent to the fourth ventricle involving the inferior vermis and medial right cerebellum. There is also slight restricted diffusion involving the posterior limb of the left internal capsule. No acute hemorrhage or mass lesion is present. Atrophy has progressed over the last 18 months. The ventricles are proportionate to the degree of atrophy. No significant extra-axial fluid collection is present. The internal auditory canals are within normal limits. White matter changes extend into the brainstem. Vascular: Flow is present in the major intracranial arteries. Skull and upper  cervical spine: The skullbase is within normal limits. The craniocervical junction is normal. Marrow signal is normal. Midline sagittal structures are otherwise unremarkable. Sinuses/Orbits: A polyp or mucous retention cyst is node along the inferior aspect of the right maxillary sinus. The paranasal sinuses and mastoid air cells are otherwise clear. The globes and orbits are within normal limits. MRA HEAD FINDINGS MRA is degraded by patient motion. This limits evaluation of distal vessels. Mild irregularity is present in the cavernous internal carotid arteries without a significant stenosis to the  ICA terminus. A1 and M1 segments are normal bilaterally. MCA bifurcations are intact. Attenuation of distal MCA and ACA branch vessels is likely exaggerated by patient motion. Pain right vertebral artery is dominant. Signal loss in the vertebral arteries bilaterally is artifactual. Distal PICA vessels are seen. The basilar artery is normal. There is asymmetric attenuation of the proximal left P1 segment. Distal PCA branch vessels are attenuated bilaterally. IMPRESSION: 1. Focal subependymal T2 signal change about the atrium of the right lateral ventricle with restricted diffusion. This raises concern for lymphoma or focal subependymal infection. The latter is considered less likely given the lack of disease elsewhere. An aggressive demyelinating process is considered less likely. Vasculitis is also considered. Question HIV or other immune mediated disease. 2. Similar findings are present along the right side of the fourth ventricle involving the inferior vermis and medial right cerebellum. 3. T2 change and restricted diffusion in the posterior limb of the left internal capsule. This may be related to the same process or subacute infarct. 4. Significant progression of diffuse white matter disease since the prior exam. This is likely related to small-vessel ischemia. 5. Narrowing of proximal left posterior cerebral artery.  6. No other significant proximal stenosis or branch vessel occlusions. 7. Evaluation of distal vessels is degraded by significant patient motion. MRI the brain with contrast would be useful for further refined aunt of the differential diagnosis. Lumbar puncture may be useful further evaluation as well given the ventricular involvement. These results were called by telephone at the time of interpretation on 04/28/2017 at 8:09 pm to Dr. Hinda Kehr , who verbally acknowledged these results. Electronically Signed   By: San Morelle M.D.   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 CONTRAST:  60mL MULTIHANCE GADOBENATE DIMEGLUMINE 529 MG/ML IV SOLN COMPARISON:  Noncontrast exam from yesterday FINDINGS: Brain: Homogeneous periventricular enhancement in areas of apparent dense cellularity by precontrast MRI yesterday. There is confluent enhancement about the fourth ventricle and cerebral aqua duct with nodular components centered on the right dentate and tectum. Minimal base enhancement about the right more than left lateral ventricles greatest posteriorly. Fourth ventricular subependymal enhancement continues inferiorly and laterally along the right foramen of Luschka. A few stippled foci of enhancement are seen in the left corona radiata. No generalized leptomeningeal enhancement is seen. Edematous changes are stable from yesterday. No shift or hydrocephalus. As before, constellation of findings favors CNS lymphoma. Would expect ventricular debris and different clinical symptomatology if this were bacterial ventriculitis. Other differential considerations were discussed previously. Vascular: Negative Skull and upper cervical spine: Negative Sinuses/Orbits: Bilateral cataract resection.  IMPRESSION: Areas of apparent dense cellularity on previous brain MRI show homogeneous enhancement. 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 Electronically Signed   By: Monte Fantasia M.D.   On: 04/29/2017 10:15    Assessment and plan- Patient is a 71 y.o. female presented with ataxia, generalized weakness.  Discussed with patient at the bedside regarding her image results. 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 Discussed with patient that the next step is to obtain tissue diagnosis. Recommend neurosurgery evaluation for biopsy ASAP. I strongly recommend to avoid corticosteroids until biopsy is done. Corticosteroids is lymphotoxic and only take single dose to alter proper histology evaluation of the CNS lymphoma.   If pathology confirms CNS lymphoma, treatment is  likely to be high-dose methotrexate containing regimen which is a inpatient chemotherapy protocol needing experienced team of nursing staff, oncology pharmacist, oncologist and nephrologist. Methotrexate levels need to be closely monitored and methotrexate toxicity also need to be watched. I recommend patient to be transferred to an academic center for further evaluation and possible treatment there. Patient agrees with the plan and is interested to be referred to academic center. She understands the rationale why steroids is currently being hold.  While waiting for the referral and being accepted to an academic center, I recommend to check HIV, Hep B surface antigen, check CBC with differential (ordered).  I also recommend ophthalmology evaluation (Slit lamp examination of both eyes) here (if feasible while waiting to be transferred) or at the academic center to rule out ocular involvement.   Above plan discussed with Dr.Gouru.  Thank you for this kind referral and the opportunity to participate in the care of this patient.   Dr. Earlie Server, MD, PhD [facility] Pager- [phone] 04/29/2017

## 2017-04-29 NOTE — Telephone Encounter (Signed)
Copied from [place] [id number]. Topic: General - Other >> Apr 29, 2017 12:04 PM Corie Chiquito, [place] wrote: Reason for CRM: Patient just wanted to let Dr.Sowles know that she is in the hospital. Darlyn Chamber is a friend of the patients and can be reached at [phone]. Patient is at [facility]

## 2017-04-29 NOTE — Progress Notes (Signed)
[facility] [place] NAME: Renee Cowan    MR#:  [id number]  DATE OF BIRTH:  02-Dec-1945  SUBJECTIVE:  CHIEF COMPLAINT: Patient is having dizziness and balance issues while walking  REVIEW OF SYSTEMS:  CONSTITUTIONAL: No fever, fatigue or weakness.  Reporting dizziness EYES: No blurred or double vision.  EARS, NOSE, AND THROAT: No tinnitus or ear pain.  RESPIRATORY: No cough, shortness of breath, wheezing or hemoptysis.  CARDIOVASCULAR: No chest pain, orthopnea, edema.  GASTROINTESTINAL: No nausea, vomiting, diarrhea or abdominal pain.  GENITOURINARY: No dysuria, hematuria.  ENDOCRINE: No polyuria, nocturia,  HEMATOLOGY: No anemia, easy bruising or bleeding SKIN: No rash or lesion. MUSCULOSKELETAL: No joint pain or arthritis.   NEUROLOGIC: No tingling, numbness, weakness.  Reporting dizziness PSYCHIATRY: No anxiety or depression.   DRUG ALLERGIES:   Allergies  Allergen Reactions  . Penicillins Rash  . Sulfa Antibiotics Rash    VITALS:  Blood pressure (!) 168/66, pulse 90, temperature 98.2 F (36.8 C), temperature source Oral, resp. rate 18, height 5\' 2"  (1.575 m), weight 49.9 kg (109 lb 14.4 oz), SpO2 95 %.  PHYSICAL EXAMINATION:  GENERAL:  71 y.o.-year-old patient lying in the bed with no acute distress.  EYES: Pupils equal, round, reactive to light and accommodation. No scleral icterus. Extraocular muscles intact.  HEENT: Head atraumatic, normocephalic. Oropharynx and nasopharynx clear.  NECK:  Supple, no jugular venous distention. No thyroid enlargement, no tenderness.  LUNGS: Normal breath sounds bilaterally, no wheezing, rales,rhonchi or crepitation. No use of accessory muscles of respiration.  CARDIOVASCULAR: S1, S2 normal. No murmurs, rubs, or gallops.  ABDOMEN: Soft, nontender, nondistended. Bowel sounds present. No organomegaly or mass.  EXTREMITIES: No pedal edema, cyanosis, or clubbing.  NEUROLOGIC: Cranial  nerves II through XII are intact. Muscle strength 5/5 in all extremities. Sensation intact. Gait not checked.  PSYCHIATRIC: The patient is alert and oriented x 3.  SKIN: No obvious rash, lesion, or ulcer.    LABORATORY PANEL:   CBC Recent Labs  Lab 04/29/17 0450  WBC 9.8  HGB 13.1  HCT 38.1  PLT 361   ------------------------------------------------------------------------------------------------------------------  Chemistries  Recent Labs  Lab 04/29/17 0450  NA 133*  K 3.6  CL 99*  CO2 26  GLUCOSE 119*  BUN 12  CREATININE 0.53  CALCIUM 8.3*   ------------------------------------------------------------------------------------------------------------------  Cardiac Enzymes No results for input(s): TROPONINI in the last 168 hours. ------------------------------------------------------------------------------------------------------------------  RADIOLOGY:  Mr Angiogram Head Wo Contrast  Result Date: 04/28/2017 CLINICAL DATA:  Ataxia.  Stroke suspected.  Subacute neuro deficit. EXAM: MRI HEAD WITHOUT CONTRAST MRA HEAD WITHOUT CONTRAST TECHNIQUE: Multiplanar, multiecho pulse sequences of the brain and surrounding structures were obtained without intravenous contrast. Angiographic images of the head were obtained using MRA technique without contrast. COMPARISON:  MRI brain 10/28/2015 FINDINGS: MRI HEAD FINDINGS Brain: There is extensive progression of diffuse white matter disease. Masslike T2 change is present about the atrium of the right lateral ventricle. There is 5 mm rim of T2 hyperintense tissue extending into the splenium of the corpus callosum with mild restricted diffusion and surrounding T2 signal. Similar signal changes of heterogeneous T2 signal and restricted diffusion are present adjacent to the fourth ventricle involving the inferior vermis and medial right cerebellum. There is also slight restricted diffusion involving the posterior limb of the left internal  capsule. No acute hemorrhage or mass lesion is present. Atrophy has progressed over the last 18 months. The ventricles are proportionate to the degree of  atrophy. No significant extra-axial fluid collection is present. The internal auditory canals are within normal limits. White matter changes extend into the brainstem. Vascular: Flow is present in the major intracranial arteries. Skull and upper cervical spine: The skullbase is within normal limits. The craniocervical junction is normal. Marrow signal is normal. Midline sagittal structures are otherwise unremarkable. Sinuses/Orbits: A polyp or mucous retention cyst is node along the inferior aspect of the right maxillary sinus. The paranasal sinuses and mastoid air cells are otherwise clear. The globes and orbits are within normal limits. MRA HEAD FINDINGS MRA is degraded by patient motion. This limits evaluation of distal vessels. Mild irregularity is present in the cavernous internal carotid arteries without a significant stenosis to the ICA terminus. A1 and M1 segments are normal bilaterally. MCA bifurcations are intact. Attenuation of distal MCA and ACA branch vessels is likely exaggerated by patient motion. Pain right vertebral artery is dominant. Signal loss in the vertebral arteries bilaterally is artifactual. Distal PICA vessels are seen. The basilar artery is normal. There is asymmetric attenuation of the proximal left P1 segment. Distal PCA branch vessels are attenuated bilaterally. IMPRESSION: 1. Focal subependymal T2 signal change about the atrium of the right lateral ventricle with restricted diffusion. This raises concern for lymphoma or focal subependymal infection. The latter is considered less likely given the lack of disease elsewhere. An aggressive demyelinating process is considered less likely. Vasculitis is also considered. Question HIV or other immune mediated disease. 2. Similar findings are present along the right side of the fourth  ventricle involving the inferior vermis and medial right cerebellum. 3. T2 change and restricted diffusion in the posterior limb of the left internal capsule. This may be related to the same process or subacute infarct. 4. Significant progression of diffuse white matter disease since the prior exam. This is likely related to small-vessel ischemia. 5. Narrowing of proximal left posterior cerebral artery. 6. No other significant proximal stenosis or branch vessel occlusions. 7. Evaluation of distal vessels is degraded by significant patient motion. MRI the brain with contrast would be useful for further refined aunt of the differential diagnosis. Lumbar puncture may be useful further evaluation as well given the ventricular involvement. These results were called by telephone at the time of interpretation on 04/28/2017 at 8:09 pm to Dr. Hinda Kehr , who verbally acknowledged these results. Electronically Signed   By: San Morelle M.D.   On: 04/28/2017 20:11   Mr Brain Wo Contrast  Result Date: 04/28/2017 CLINICAL DATA:  Ataxia.  Stroke suspected.  Subacute neuro deficit. EXAM: MRI HEAD WITHOUT CONTRAST MRA HEAD WITHOUT CONTRAST TECHNIQUE: Multiplanar, multiecho pulse sequences of the brain and surrounding structures were obtained without intravenous contrast. Angiographic images of the head were obtained using MRA technique without contrast. COMPARISON:  MRI brain 10/28/2015 FINDINGS: MRI HEAD FINDINGS Brain: There is extensive progression of diffuse white matter disease. Masslike T2 change is present about the atrium of the right lateral ventricle. There is 5 mm rim of T2 hyperintense tissue extending into the splenium of the corpus callosum with mild restricted diffusion and surrounding T2 signal. Similar signal changes of heterogeneous T2 signal and restricted diffusion are present adjacent to the fourth ventricle involving the inferior vermis and medial right cerebellum. There is also slight restricted  diffusion involving the posterior limb of the left internal capsule. No acute hemorrhage or mass lesion is present. Atrophy has progressed over the last 18 months. The ventricles are proportionate to the degree of atrophy. No significant  extra-axial fluid collection is present. The internal auditory canals are within normal limits. White matter changes extend into the brainstem. Vascular: Flow is present in the major intracranial arteries. Skull and upper cervical spine: The skullbase is within normal limits. The craniocervical junction is normal. Marrow signal is normal. Midline sagittal structures are otherwise unremarkable. Sinuses/Orbits: A polyp or mucous retention cyst is node along the inferior aspect of the right maxillary sinus. The paranasal sinuses and mastoid air cells are otherwise clear. The globes and orbits are within normal limits. MRA HEAD FINDINGS MRA is degraded by patient motion. This limits evaluation of distal vessels. Mild irregularity is present in the cavernous internal carotid arteries without a significant stenosis to the ICA terminus. A1 and M1 segments are normal bilaterally. MCA bifurcations are intact. Attenuation of distal MCA and ACA branch vessels is likely exaggerated by patient motion. Pain right vertebral artery is dominant. Signal loss in the vertebral arteries bilaterally is artifactual. Distal PICA vessels are seen. The basilar artery is normal. There is asymmetric attenuation of the proximal left P1 segment. Distal PCA branch vessels are attenuated bilaterally. IMPRESSION: 1. Focal subependymal T2 signal change about the atrium of the right lateral ventricle with restricted diffusion. This raises concern for lymphoma or focal subependymal infection. The latter is considered less likely given the lack of disease elsewhere. An aggressive demyelinating process is considered less likely. Vasculitis is also considered. Question HIV or other immune mediated disease. 2. Similar  findings are present along the right side of the fourth ventricle involving the inferior vermis and medial right cerebellum. 3. T2 change and restricted diffusion in the posterior limb of the left internal capsule. This may be related to the same process or subacute infarct. 4. Significant progression of diffuse white matter disease since the prior exam. This is likely related to small-vessel ischemia. 5. Narrowing of proximal left posterior cerebral artery. 6. No other significant proximal stenosis or branch vessel occlusions. 7. Evaluation of distal vessels is degraded by significant patient motion. MRI the brain with contrast would be useful for further refined aunt of the differential diagnosis. Lumbar puncture may be useful further evaluation as well given the ventricular involvement. These results were called by telephone at the time of interpretation on 04/28/2017 at 8:09 pm to Dr. Hinda Kehr , who verbally acknowledged these results. Electronically Signed   By: San Morelle M.D.   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 CONTRAST:  14mL MULTIHANCE GADOBENATE DIMEGLUMINE 529 MG/ML IV SOLN COMPARISON:  Noncontrast exam from yesterday FINDINGS: Brain: Homogeneous periventricular enhancement in areas of apparent dense cellularity by precontrast MRI yesterday. There is confluent enhancement about the fourth ventricle and cerebral aqua duct with nodular components centered on the right dentate and tectum. Minimal base enhancement about the right more than left lateral ventricles greatest posteriorly. Fourth ventricular subependymal enhancement continues inferiorly and laterally along the right foramen of Luschka. A few stippled foci of enhancement are seen in the left corona  radiata. No generalized leptomeningeal enhancement is seen. Edematous changes are stable from yesterday. No shift or hydrocephalus. As before, constellation of findings favors CNS lymphoma. Would expect ventricular debris and different clinical symptomatology if this were bacterial ventriculitis. Other differential considerations were discussed previously. Vascular: Negative Skull and upper cervical spine: Negative Sinuses/Orbits: Bilateral cataract resection.  IMPRESSION: Areas of apparent dense cellularity on previous brain MRI show homogeneous enhancement. 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 Electronically Signed   By: Monte Fantasia M.D.   On: 04/29/2017 10:15    EKG:   Orders placed or performed during the hospital encounter of 04/28/17  . ED EKG  . ED EKG  . EKG 12-Lead  . EKG 12-Lead  . 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 Doy Mince and oncology Dr. Tasia Catchings, recommending neurosurgery evaluation for biopsy as soon as possible , strongly recommending to avoid corticosteroids until biopsy is done.  Will transfer patient to tertiary care center if patient is agreeable as  she might need inpatient chemotherapy - high-dose methotrexate   Hyponatremia.  Normal saline IV in the follow-up BMP.  Hypertension.  IV hydralazine as needed.  Diabetes.  Sliding scale.  Tobacco abuse.  Smoking cessation was consulted for 3-4 minutes.      All the records are reviewed and case discussed with Care Management/Social Workerr. Management plans discussed with the patient, son will [phone] over phone and they are in agreement.  CODE STATUS: fc   TOTAL TIME TAKING CARE OF THIS PATIENT: 39  minutes.   POSSIBLE D/C IN 1 DAYS, DEPENDING ON CLINICAL CONDITION.  Note: This dictation was prepared with Dragon dictation along with smaller phrase technology. Any transcriptional  errors that result from this process are unintentional.   Nicholes Mango M.D on 04/29/2017 at 4:36 PM  Between 7am to 6pm - Pager - [phone] After 6pm go to [email or web address] - password [email or web address] [facility]  [facility]  Office  [phone]  CC: Primary care physician; Steele Sizer, MD

## 2017-04-30 ENCOUNTER — Encounter: Payer: Self-pay | Admitting: Family Medicine

## 2017-04-30 LAB — CBC WITH DIFFERENTIAL/PLATELET
BASOS ABS: 0.1 10*3/uL (ref 0–0.1)
BASOS PCT: 0 %
Eosinophils Absolute: 0.2 10*3/uL (ref 0–0.7)
Eosinophils Relative: 1 %
HEMATOCRIT: 37.7 % (ref 35.0–47.0)
HEMOGLOBIN: 13 g/dL (ref 12.0–16.0)
LYMPHS PCT: 10 %
Lymphs Abs: 1.2 10*3/uL (ref 1.0–3.6)
MCH: 33.1 pg (ref 26.0–34.0)
MCHC: 34.4 g/dL (ref 32.0–36.0)
MCV: 96 fL (ref 80.0–100.0)
Monocytes Absolute: 0.8 10*3/uL (ref 0.2–0.9)
Monocytes Relative: 6 %
NEUTROS ABS: 10.2 10*3/uL — AB (ref 1.4–6.5)
NEUTROS PCT: 83 %
Platelets: 339 10*3/uL (ref 150–440)
RBC: 3.92 MIL/uL (ref 3.80–5.20)
RDW: 14.2 % (ref 11.5–14.5)
WBC: 12.4 10*3/uL — ABNORMAL HIGH (ref 3.6–11.0)

## 2017-04-30 LAB — GLUCOSE, CAPILLARY
GLUCOSE-CAPILLARY: 124 mg/dL — AB (ref 65–99)
GLUCOSE-CAPILLARY: 114 mg/dL — AB (ref 65–99)
GLUCOSE-CAPILLARY: 128 mg/dL — AB (ref 65–99)
GLUCOSE-CAPILLARY: 159 mg/dL — AB (ref 65–99)

## 2017-04-30 LAB — LACTATE DEHYDROGENASE: LDH: 120 U/L (ref 98–192)

## 2017-04-30 MED ORDER — ONDANSETRON HCL 4 MG/2ML IJ SOLN: 4.0000 mg | Freq: Four times a day (QID) | INTRAMUSCULAR | 0 refills | Status: DC | PRN

## 2017-04-30 MED ORDER — ADULT MULTIVITAMIN W/MINERALS CH: 1.0000 | ORAL_TABLET | Freq: Every day | ORAL | Status: DC

## 2017-04-30 MED ORDER — ONDANSETRON HCL 4 MG PO TABS: 4.0000 mg | ORAL_TABLET | Freq: Four times a day (QID) | ORAL | 0 refills | Status: DC | PRN

## 2017-04-30 MED ORDER — ENOXAPARIN SODIUM 40 MG/0.4ML ~~LOC~~ SOLN: 40.0000 mg | SUBCUTANEOUS | Status: DC

## 2017-04-30 MED ORDER — INSULIN ASPART 100 UNIT/ML ~~LOC~~ SOLN: 0.0000 [IU] | Freq: Three times a day (TID) | SUBCUTANEOUS | 11 refills | Status: DC

## 2017-04-30 MED ORDER — ACETAMINOPHEN 325 MG PO TABS: 650.0000 mg | ORAL_TABLET | Freq: Four times a day (QID) | ORAL | Status: DC | PRN

## 2017-04-30 MED ORDER — HYDRALAZINE HCL 20 MG/ML IJ SOLN: 10.0000 mg | Freq: Four times a day (QID) | INTRAMUSCULAR | Status: DC | PRN

## 2017-04-30 MED ORDER — BISACODYL 5 MG PO TBEC: 5.0000 mg | DELAYED_RELEASE_TABLET | Freq: Every day | ORAL | 0 refills | Status: DC | PRN

## 2017-04-30 MED ORDER — METOPROLOL TARTRATE 5 MG/5ML IV SOLN: 5.0000 mg | INTRAVENOUS | Status: DC | PRN

## 2017-04-30 MED ORDER — HYDROCODONE-ACETAMINOPHEN 5-325 MG PO TABS: 1.0000 | ORAL_TABLET | ORAL | 0 refills | Status: DC | PRN

## 2017-04-30 MED ORDER — ENSURE ENLIVE PO LIQD: 237.0000 mL | Freq: Three times a day (TID) | ORAL | 12 refills | Status: DC

## 2017-04-30 MED ORDER — ALBUTEROL SULFATE (2.5 MG/3ML) 0.083% IN NEBU: 2.5000 mg | INHALATION_SOLUTION | RESPIRATORY_TRACT | 12 refills | Status: DC | PRN

## 2017-04-30 MED ORDER — CEPHALEXIN 500 MG PO CAPS
500.0000 mg | ORAL_CAPSULE | Freq: Two times a day (BID) | ORAL | Status: DC
Start: 2017-04-30 — End: 2017-05-09

## 2017-04-30 MED ORDER — SENNOSIDES-DOCUSATE SODIUM 8.6-50 MG PO TABS: 1.0000 | ORAL_TABLET | Freq: Every evening | ORAL | Status: DC | PRN

## 2017-04-30 MED ORDER — INSULIN ASPART 100 UNIT/ML ~~LOC~~ SOLN: 0.0000 [IU] | Freq: Every day | SUBCUTANEOUS | 11 refills | Status: DC

## 2017-04-30 NOTE — Discharge Instructions (Signed)
transfer patient to [facility] neurosurgery Dr. Johnney Killian when bed is available

## 2017-04-30 NOTE — Care Management Important Message (Signed)
Important Message  Patient Details  Name: Renee Cowan MRN: [id number] Date of Birth: Aug 23, 1945   Medicare Important Message Given:  Yes    Shelbie Ammons, RN 04/30/2017, 7:04 AM

## 2017-04-30 NOTE — Progress Notes (Signed)
Speech Therapy Note: received order, reviewed chart notes and consulted NSG. ST services will f/u w/ pt tomorrow for any indicated assessment. With regard to any Cognitive assessment, pt would benefit from a more formal assessment once medical status is more stable and she is more removed from the acuity of the hospitalization. Pt should have 24/7 supervision once returning home alone if there is any concern re: safety issues in the home.      Orinda Kenner, [place], CCC-SLP

## 2017-04-30 NOTE — Discharge Summary (Signed)
[facility] [place] NAME: Renee Cowan    MR#:  [id number]  DATE OF BIRTH:  August 02, 1945  DATE OF ADMISSION:  04/28/2017 ADMITTING PHYSICIAN: Demetrios Loll, MD  DATE OF DISCHARGE: 04/30/17  PRIMARY CARE PHYSICIAN: Steele Sizer, MD    ADMISSION DIAGNOSIS:  Ataxia [R27.0] Abnormal MRI of head [R93.0] Urinary tract infection without hematuria, site unspecified [N39.0]  DISCHARGE DIAGNOSIS:  Active Problems:   Lymphoma ([place])  Dizziness SECONDARY DIAGNOSIS:   Past Medical History:  Diagnosis Date  . Allergy   . Cataract   . Concussion   . Depression   . Fibromyalgia   . Hyperlipidemia   . Hypertension   . Hypothyroidism   . Osteoporosis   . Rheumatoid arteritis   . Trigger finger   . Vitamin D deficiency     HOSPITAL COURSE:   hpi  Tashawn Greff  is a 71 y.o. female with a known history of hypertension, diabetes, hyperlipidemia, rheumatoid arthritis, fibromyalgia and hypothyroidism.  The patient has had chronic vertigo and gait instability in the past.  She presented to the ED due to generalized weakness, worsening dizziness and gait ataxia.  She also complains of blurred vision, nausea and vomiting today.  She said she has hearing loss and weight loss recently.  She denies any focal weakness, numbness, tingling or incontinence or slurred speech.  MRI of the brain today suspect lymphoma or focal subependymal infection. The latter is considered less likely given the lack of disease elsewhere.  ED physician suggested lumbar puncture, but the patient refused this time.  Ataxia -could be secondary to lymphomaof Brain. 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 Doy Mince and oncology Dr. Tasia Catchings, recommending neurosurgery evaluation for biopsy as soon as possible , strongly recommending to avoid corticosteroids until biopsy is done.  Will transfer patient to [facility] care center , patient is  agreeable as  she might need inpatient chemotherapy - high-dose methotrexate.  Patient was accepted by Dr. Johnney Killian, neurosurgery at [facility]   Hyponatremia. Normal saline IV in the follow-up BMP.  Hypertension. IV hydralazine as needed.  Diabetes. Sliding scale.  Tobacco abuse. Smoking cessation was provided for 4 minutes.      DISCHARGE CONDITIONS:   fair  CONSULTS OBTAINED:  Treatment Team:  Alexis Goodell, MD Twana First, MD   PROCEDURES none  DRUG ALLERGIES:   Allergies  Allergen Reactions  . Penicillins Rash  . Sulfa Antibiotics Rash    DISCHARGE MEDICATIONS:   Current Discharge Medication List    START taking these medications   Details  acetaminophen (TYLENOL) 325 MG tablet Take 2 tablets (650 mg total) every 6 (six) hours as needed by mouth for mild pain (or Fever >/= 101).    albuterol (PROVENTIL) (2.5 MG/3ML) 0.083% nebulizer solution Take 3 mLs (2.5 mg total) every 2 (two) hours as needed by nebulization for wheezing. Qty: 75 mL, Refills: 12    bisacodyl (DULCOLAX) 5 MG EC tablet Take 1 tablet (5 mg total) daily as needed by mouth for moderate constipation. Qty: 30 tablet, Refills: 0    cephALEXin (KEFLEX) 500 MG capsule Take 1 capsule (500 mg total) every 12 (twelve) hours by mouth.    enoxaparin (LOVENOX) 40 MG/0.4ML injection Inject 0.4 mLs (40 mg total) daily into the skin. Qty: 0 Syringe    feeding supplement, ENSURE ENLIVE, (ENSURE ENLIVE) LIQD Take 237 mLs 3 (three) times daily between meals by mouth. Qty: 237 mL,  Refills: 12    hydrALAZINE (APRESOLINE) 20 MG/ML injection Inject 0.5 mLs (10 mg total) every 6 (six) hours as needed into the vein (SBP>180 or DBP>100). Qty: 1 mL    HYDROcodone-acetaminophen (NORCO/VICODIN) 5-325 MG tablet Take 1-2 tablets every 4 (four) hours as needed by mouth for moderate pain. Qty: 30 tablet, Refills: 0    !! insulin aspart (NOVOLOG) 100 UNIT/ML injection Inject 0-5 Units at bedtime into  the skin. Qty: 10 mL, Refills: 11    !! insulin aspart (NOVOLOG) 100 UNIT/ML injection Inject 0-9 Units 3 (three) times daily with meals into the skin. Qty: 10 mL, Refills: 11    metoprolol tartrate (LOPRESSOR) 5 MG/5ML SOLN injection Inject 5 mLs (5 mg total) every 4 (four) hours as needed into the vein (SBP greater than 160 heart rate greater than 100). Qty: 15 mL    Multiple Vitamin (MULTIVITAMIN WITH MINERALS) TABS tablet Take 1 tablet daily by mouth.    ondansetron (ZOFRAN) 4 MG tablet Take 1 tablet (4 mg total) every 6 (six) hours as needed by mouth for nausea. Qty: 20 tablet, Refills: 0    ondansetron (ZOFRAN) 4 MG/2ML SOLN injection Inject 2 mLs (4 mg total) every 6 (six) hours as needed into the vein for nausea. Qty: 2 mL, Refills: 0    senna-docusate (SENOKOT-S) 8.6-50 MG tablet Take 1 tablet at bedtime as needed by mouth for mild constipation.     !! - Potential duplicate medications found. Please discuss with provider.    CONTINUE these medications which have NOT CHANGED   Details  Calcium Carb-Cholecalciferol (CALCIUM + D3) 600-200 MG-UNIT TABS Take by mouth.    cyanocobalamin 500 MCG tablet Take 500 mcg by mouth daily.    cyclobenzaprine (FLEXERIL) 10 MG tablet Take 1 tablet (10 mg total) by mouth 3 (three) times daily as needed for muscle spasms. Qty: 30 tablet, Refills: 0    DULoxetine (CYMBALTA) 60 MG capsule Take 1 capsule (60 mg total) by mouth daily. Qty: 90 capsule, Refills: 1   Associated Diagnoses: Moderate recurrent major depression ([place]); Fibromyalgia syndrome    folic acid (FOLVITE) 1 MG tablet Take 1 mg by mouth daily. Refills: 1    levothyroxine (SYNTHROID, LEVOTHROID) 50 MCG tablet TAKE 1 TABLET BY MOUTH  DAILY BEFORE BREAKFAST Qty: 90 tablet, Refills: 1   Associated Diagnoses: Adult hypothyroidism    Magnesium 400 MG CAPS Take 1 capsule by mouth daily. Qty: 30 capsule, Refills: 0      STOP taking these medications     aspirin (ECOTRIN LOW  STRENGTH) 81 MG EC tablet      conjugated estrogens (PREMARIN) vaginal cream      meloxicam (MOBIC) 15 MG tablet      methotrexate (RHEUMATREX) 2.5 MG tablet          DISCHARGE INSTRUCTIONS:  Transfer patient to [place] neurosurgery Dr. Johnney Killian   DIET:  Regular diet  DISCHARGE CONDITION:  Fair  ACTIVITY:  Activity as tolerated  OXYGEN:  Home Oxygen: No.   Oxygen Delivery: room air  DISCHARGE LOCATION:  transfer patient to [place] neurosurgery Dr. Johnney Killian, when bed  is available     If you experience worsening of your admission symptoms, develop shortness of breath, life threatening emergency, suicidal or homicidal thoughts you must seek medical attention immediately by calling 911 or calling your MD immediately  if symptoms less severe.  You Must read complete instructions/literature along with all the possible adverse reactions/side effects for all the Medicines you take and  that have been prescribed to you. Take any new Medicines after you have completely understood and accpet all the possible adverse reactions/side effects.   Please note  You were cared for by a hospitalist during your hospital stay. If you have any questions about your discharge medications or the care you received while you were in the hospital after you are discharged, you can call the unit and asked to speak with the hospitalist on call if the hospitalist that took care of you is not available. Once you are discharged, your primary care physician will handle any further medical issues. Please note that NO REFILLS for any discharge medications will be authorized once you are discharged, as it is imperative that you return to your primary care physician (or establish a relationship with a primary care physician if you do not have one) for your aftercare needs so that they can reassess your need for medications and monitor your lab values.     Today  Chief Complaint  Patient presents with  . Dizziness  .  Hypertension    Patient is still having dizziness, unsteady while ambulating  ROS:  CONSTITUTIONAL: Denies fevers, chills. Denies any fatigue, weakness.  EYES: Denies blurry vision, double vision, eye pain. EARS, NOSE, THROAT: Denies tinnitus, ear pain, hearing loss. RESPIRATORY: Denies cough, wheeze, shortness of breath.  CARDIOVASCULAR: Denies chest pain, palpitations, edema.  GASTROINTESTINAL: Denies nausea, vomiting, diarrhea, abdominal pain. Denies bright red blood per rectum. GENITOURINARY: Denies dysuria, hematuria. ENDOCRINE: Denies nocturia or thyroid problems. HEMATOLOGIC AND LYMPHATIC: Denies easy bruising or bleeding. SKIN: Denies rash or lesion. MUSCULOSKELETAL: Denies pain in neck, back, shoulder, knees, hips or arthritic symptoms.  NEUROLOGIC: Dizziness and unsteady while ambulating, denies paralysis, paresthesias.  PSYCHIATRIC: Denies anxiety or depressive symptoms.   VITAL SIGNS:  Blood pressure (!) 154/104, pulse 97, temperature 99.4 F (37.4 C), temperature source Oral, resp. rate 18, height 5\' 2"  (1.575 m), weight 49.9 kg (109 lb 14.4 oz), SpO2 97 %.  I/O:    Intake/Output Summary (Last 24 hours) at 04/30/2017 1311 Last data filed at 04/30/2017 0318 Gross per 24 hour  Intake 2415 ml  Output -  Net 2415 ml    PHYSICAL EXAMINATION:  GENERAL:  71 y.o.-year-old patient lying in the bed with no acute distress.  EYES: Pupils equal, round, reactive to light and accommodation. No scleral icterus. Extraocular muscles intact.  HEENT: Head atraumatic, normocephalic. Oropharynx and nasopharynx clear.  NECK:  Supple, no jugular venous distention. No thyroid enlargement, no tenderness.  LUNGS: Normal breath sounds bilaterally, no wheezing, rales,rhonchi or crepitation. No use of accessory muscles of respiration.  CARDIOVASCULAR: S1, S2 normal. No murmurs, rubs, or gallops.  ABDOMEN: Soft, non-tender, non-distended. Bowel sounds present. No organomegaly or mass.   EXTREMITIES: No pedal edema, cyanosis, or clubbing.  NEUROLOGIC: Cranial nerves II through XII are intact. Muscle strength 5/5 in all extremities. Sensation intact. Gait not checked.  PSYCHIATRIC: The patient is alert and oriented x 3.  SKIN: No obvious rash, lesion, or ulcer.   DATA REVIEW:   CBC Recent Labs  Lab 04/30/17 0436  WBC 12.4*  HGB 13.0  HCT 37.7  PLT 339    Chemistries  Recent Labs  Lab 04/29/17 0450  NA 133*  K 3.6  CL 99*  CO2 26  GLUCOSE 119*  BUN 12  CREATININE 0.53  CALCIUM 8.3*    Cardiac Enzymes No results for input(s): TROPONINI in the last 168 hours.  Microbiology Results  Results for orders placed  or performed during the hospital encounter of 04/28/17  Urine Culture     Status: Abnormal (Preliminary result)   Collection Time: 04/28/17  4:21 PM  Result Value Ref Range Status   Specimen Description URINE, RANDOM  Final   Special Requests Normal  Final   Culture >=100,000 COLONIES/mL GRAM NEGATIVE RODS (A)  Final   Report Status PENDING  Incomplete    RADIOLOGY:  Mr Angiogram Head Wo Contrast  Result Date: 04/28/2017 CLINICAL DATA:  Ataxia.  Stroke suspected.  Subacute neuro deficit. EXAM: MRI HEAD WITHOUT CONTRAST MRA HEAD WITHOUT CONTRAST TECHNIQUE: Multiplanar, multiecho pulse sequences of the brain and surrounding structures were obtained without intravenous contrast. Angiographic images of the head were obtained using MRA technique without contrast. COMPARISON:  MRI brain 10/28/2015 FINDINGS: MRI HEAD FINDINGS Brain: There is extensive progression of diffuse white matter disease. Masslike T2 change is present about the atrium of the right lateral ventricle. There is 5 mm rim of T2 hyperintense tissue extending into the splenium of the corpus callosum with mild restricted diffusion and surrounding T2 signal. Similar signal changes of heterogeneous T2 signal and restricted diffusion are present adjacent to the fourth ventricle involving the  inferior vermis and medial right cerebellum. There is also slight restricted diffusion involving the posterior limb of the left internal capsule. No acute hemorrhage or mass lesion is present. Atrophy has progressed over the last 18 months. The ventricles are proportionate to the degree of atrophy. No significant extra-axial fluid collection is present. The internal auditory canals are within normal limits. White matter changes extend into the brainstem. Vascular: Flow is present in the major intracranial arteries. Skull and upper cervical spine: The skullbase is within normal limits. The craniocervical junction is normal. Marrow signal is normal. Midline sagittal structures are otherwise unremarkable. Sinuses/Orbits: A polyp or mucous retention cyst is node along the inferior aspect of the right maxillary sinus. The paranasal sinuses and mastoid air cells are otherwise clear. The globes and orbits are within normal limits. MRA HEAD FINDINGS MRA is degraded by patient motion. This limits evaluation of distal vessels. Mild irregularity is present in the cavernous internal carotid arteries without a significant stenosis to the ICA terminus. A1 and M1 segments are normal bilaterally. MCA bifurcations are intact. Attenuation of distal MCA and ACA branch vessels is likely exaggerated by patient motion. Pain right vertebral artery is dominant. Signal loss in the vertebral arteries bilaterally is artifactual. Distal PICA vessels are seen. The basilar artery is normal. There is asymmetric attenuation of the proximal left P1 segment. Distal PCA branch vessels are attenuated bilaterally. IMPRESSION: 1. Focal subependymal T2 signal change about the atrium of the right lateral ventricle with restricted diffusion. This raises concern for lymphoma or focal subependymal infection. The latter is considered less likely given the lack of disease elsewhere. An aggressive demyelinating process is considered less likely. Vasculitis is  also considered. Question HIV or other immune mediated disease. 2. Similar findings are present along the right side of the fourth ventricle involving the inferior vermis and medial right cerebellum. 3. T2 change and restricted diffusion in the posterior limb of the left internal capsule. This may be related to the same process or subacute infarct. 4. Significant progression of diffuse white matter disease since the prior exam. This is likely related to small-vessel ischemia. 5. Narrowing of proximal left posterior cerebral artery. 6. No other significant proximal stenosis or branch vessel occlusions. 7. Evaluation of distal vessels is degraded by significant patient motion. MRI the  brain with contrast would be useful for further refined aunt of the differential diagnosis. Lumbar puncture may be useful further evaluation as well given the ventricular involvement. These results were called by telephone at the time of interpretation on 04/28/2017 at 8:09 pm to Dr. Hinda Kehr , who verbally acknowledged these results. Electronically Signed   By: San Morelle M.D.   On: 04/28/2017 20:11   Mr Brain Wo Contrast  Result Date: 04/28/2017 CLINICAL DATA:  Ataxia.  Stroke suspected.  Subacute neuro deficit. EXAM: MRI HEAD WITHOUT CONTRAST MRA HEAD WITHOUT CONTRAST TECHNIQUE: Multiplanar, multiecho pulse sequences of the brain and surrounding structures were obtained without intravenous contrast. Angiographic images of the head were obtained using MRA technique without contrast. COMPARISON:  MRI brain 10/28/2015 FINDINGS: MRI HEAD FINDINGS Brain: There is extensive progression of diffuse white matter disease. Masslike T2 change is present about the atrium of the right lateral ventricle. There is 5 mm rim of T2 hyperintense tissue extending into the splenium of the corpus callosum with mild restricted diffusion and surrounding T2 signal. Similar signal changes of heterogeneous T2 signal and restricted diffusion are  present adjacent to the fourth ventricle involving the inferior vermis and medial right cerebellum. There is also slight restricted diffusion involving the posterior limb of the left internal capsule. No acute hemorrhage or mass lesion is present. Atrophy has progressed over the last 18 months. The ventricles are proportionate to the degree of atrophy. No significant extra-axial fluid collection is present. The internal auditory canals are within normal limits. White matter changes extend into the brainstem. Vascular: Flow is present in the major intracranial arteries. Skull and upper cervical spine: The skullbase is within normal limits. The craniocervical junction is normal. Marrow signal is normal. Midline sagittal structures are otherwise unremarkable. Sinuses/Orbits: A polyp or mucous retention cyst is node along the inferior aspect of the right maxillary sinus. The paranasal sinuses and mastoid air cells are otherwise clear. The globes and orbits are within normal limits. MRA HEAD FINDINGS MRA is degraded by patient motion. This limits evaluation of distal vessels. Mild irregularity is present in the cavernous internal carotid arteries without a significant stenosis to the ICA terminus. A1 and M1 segments are normal bilaterally. MCA bifurcations are intact. Attenuation of distal MCA and ACA branch vessels is likely exaggerated by patient motion. Pain right vertebral artery is dominant. Signal loss in the vertebral arteries bilaterally is artifactual. Distal PICA vessels are seen. The basilar artery is normal. There is asymmetric attenuation of the proximal left P1 segment. Distal PCA branch vessels are attenuated bilaterally. IMPRESSION: 1. Focal subependymal T2 signal change about the atrium of the right lateral ventricle with restricted diffusion. This raises concern for lymphoma or focal subependymal infection. The latter is considered less likely given the lack of disease elsewhere. An aggressive  demyelinating process is considered less likely. Vasculitis is also considered. Question HIV or other immune mediated disease. 2. Similar findings are present along the right side of the fourth ventricle involving the inferior vermis and medial right cerebellum. 3. T2 change and restricted diffusion in the posterior limb of the left internal capsule. This may be related to the same process or subacute infarct. 4. Significant progression of diffuse white matter disease since the prior exam. This is likely related to small-vessel ischemia. 5. Narrowing of proximal left posterior cerebral artery. 6. No other significant proximal stenosis or branch vessel occlusions. 7. Evaluation of distal vessels is degraded by significant patient motion. MRI the brain with contrast  would be useful for further refined aunt of the differential diagnosis. Lumbar puncture may be useful further evaluation as well given the ventricular involvement. These results were called by telephone at the time of interpretation on 04/28/2017 at 8:09 pm to Dr. Hinda Kehr , who verbally acknowledged these results. Electronically Signed   By: San Morelle M.D.   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 CONTRAST:  27mL MULTIHANCE GADOBENATE DIMEGLUMINE 529 MG/ML IV SOLN COMPARISON:  Noncontrast exam from yesterday FINDINGS: Brain: Homogeneous periventricular enhancement in areas of apparent dense cellularity by precontrast MRI yesterday. There is confluent enhancement about the fourth ventricle and cerebral aqua duct with nodular components centered on the right dentate and tectum. Minimal base enhancement about the right more than left lateral ventricles greatest posteriorly. Fourth ventricular subependymal enhancement  continues inferiorly and laterally along the right foramen of Luschka. A few stippled foci of enhancement are seen in the left corona radiata. No generalized leptomeningeal enhancement is seen. Edematous changes are stable from yesterday. No shift or hydrocephalus. As before, constellation of findings favors CNS lymphoma. Would expect ventricular debris and different clinical symptomatology if this were bacterial ventriculitis. Other differential considerations were discussed previously. Vascular: Negative Skull and upper cervical spine: Negative Sinuses/Orbits: Bilateral cataract resection. IMPRESSION: Areas of apparent dense cellularity on previous brain MRI show homogeneous enhancement. 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 Electronically Signed   By: Monte Fantasia M.D.   On: 04/29/2017 10:15    EKG:   Orders placed or performed during the hospital encounter of 04/28/17  . ED EKG  . ED EKG  . EKG 12-Lead  . EKG 12-Lead  . EKG      Management plans discussed with the patient, son over phone and they are in agreement.  CODE STATUS:     Code Status Orders  (From admission, onward)        Start     Ordered   04/28/17 2056  Full code  Continuous     04/28/17 2055    Code Status History    Date Active Date Inactive Code Status Order ID Comments User Context   This patient has a current code status but no historical code status.    Advance Directive Documentation     Most Recent Value  Type of Advance Directive  Healthcare Power of Attorney, Living will  Pre-existing out of facility DNR order (yellow form or pink MOST form)  No data  "MOST" Form in Place?  No data      TOTAL TIME TAKING CARE OF THIS PATIENT: 43 minutes.   Note: This dictation was prepared with Dragon dictation along with smaller phrase technology. Any transcriptional errors that result from this process are unintentional.   @[facility] @  on 04/30/2017 at 1:11 PM  Between 7am to 6pm - Pager -  [phone]  After 6pm go to [email or web address] - password [email or web address] [facility]  [facility]  Office  [phone]  CC: Primary care physician; Steele Sizer, MD

## 2017-04-30 NOTE — Telephone Encounter (Signed)
Called her friend and left a message. I will try to go see her at [facility] this afternoon

## 2017-04-30 NOTE — Care Management Note (Signed)
Case Management Note  Patient Details  Name: Renee Cowan MRN: [id number] Date of Birth: June 30, 1945  Subjective/Objective:  Admitted to [facility] with the diagnosis of lymphoma.   Lives alone. Son is Gwyndolyn Saxon [phone]). Last seen Dr. Ancil Boozer a week ago. Prescriptions are filled at [facility] and [facility]. No home Health. No skilled Nursing No home oxygen. Uses a cane to aide in ambulation. Takes care of all basic and instrumental activities of daily living herself, drives. Decreased appetite x 1 month. Loss of 13 pounds. [place] prior to this admission. Friend/family will transport.                Action/Plan: Physical therapy evaluation completed. Recommending outpatient therapy. Ms. Trusty is in agreement with this plan   Expected Discharge Date:                  Expected Discharge Plan:     In-House Referral:     Discharge planning Services   yes  Post Acute Care Choice:   yes Choice offered to:   Ms. Ilda Basset  DME Arranged:    DME Agency:     HH Arranged:    [place] Agency:     Status of Service:     If discussed at [place] of Stay Meetings, dates discussed:    Additional Comments:  Shelbie Ammons, RN MSN CCM Care Management [phone] 04/30/2017, 11:37 AM

## 2017-05-01 ENCOUNTER — Inpatient Hospital Stay: Payer: Medicare Other

## 2017-05-01 LAB — URINE CULTURE
Culture: >=100000 — AB
Special Requests: NORMAL

## 2017-05-01 LAB — GLUCOSE, CAPILLARY
GLUCOSE-CAPILLARY: 132 mg/dL — AB (ref 65–99)
Glucose-Capillary: 117 mg/dL — ABNORMAL HIGH (ref 65–99)
Glucose-Capillary: 125 mg/dL — ABNORMAL HIGH (ref 65–99)
Glucose-Capillary: 128 mg/dL — ABNORMAL HIGH (ref 65–99)

## 2017-05-01 LAB — HEPATITIS B SURFACE ANTIGEN: HEP B S AG: NEGATIVE

## 2017-05-01 LAB — HIV ANTIBODY (ROUTINE TESTING W REFLEX): HIV Screen 4th Generation wRfx: NONREACTIVE

## 2017-05-01 MED ORDER — METOPROLOL TARTRATE 25 MG PO TABS
25.0000 mg | ORAL_TABLET | Freq: Two times a day (BID) | ORAL | Status: DC
  Administered 2017-05-01: 14:00:00 25 mg via ORAL
  Filled 2017-05-01: qty 1

## 2017-05-01 MED ORDER — METOPROLOL TARTRATE 25 MG PO TABS
25.0000 mg | ORAL_TABLET | Freq: Once | ORAL | Status: AC
  Administered 2017-05-01: 17:00:00 25 mg via ORAL
  Filled 2017-05-01: qty 1

## 2017-05-01 MED ORDER — METOPROLOL TARTRATE 50 MG PO TABS
50.0000 mg | ORAL_TABLET | Freq: Two times a day (BID) | ORAL | Status: DC
Start: 2017-05-01 — End: 2017-05-04
  Administered 2017-05-01 – 2017-05-03 (×5): 50 mg via ORAL
  Filled 2017-05-01 (×5): qty 1

## 2017-05-01 NOTE — Progress Notes (Signed)
Patient family member (son) notified of patient fall.

## 2017-05-01 NOTE — Progress Notes (Addendum)
SLP Cancellation Note  Patient Details Name: Renee Cowan MRN: [id number] DOB: [birth year]   Cancelled treatment:       Reason Eval/Treat Not Completed: SLP screened, no needs identified, will sign off(there had been a concern re: Cognition per report). Reviewed chart notes; consulted NSG who denied any swallowing issues as well as described that pt was verbally conversive and indicating wants/needs appropriately.  Met w/ pt who awakened then conversed w/ SLP w/ no overt deficits noted. Pt verbally described why she was in the hospital(general) then indicated need to go to the bathroom. Pt then picked food items for lunch meal which SLP called in to order for pt. Pt verbalized she did not feel she was having any difficulty w/ her memory or her verbal communication w/ others. She acknowledged she had "a lot going on being sick"; new dx of suspecion of lymphoma per MRI.  Encouraged pt to slow down/rest if feeling overwhelmed and ask for help in understanding something, especially new medical information. Pt denied needing any ST services at this time for speech-language issues. Information given on the role of a SLP if any need in future. Pt agreed verbally; NSG updated. ST services will be available for assessment if indicated while pt is admitted.    Orinda Kenner, [place], CCC-SLP Willistine Ferrall 05/01/2017, 12:02 PM

## 2017-05-01 NOTE — Progress Notes (Signed)
At 2019, patient had a witnessed fall while attempting to use the bedside commode with assistance. Patient sat on buttocks and left hip. Patient did not hit head. Patient was assessed post-fall and reported no pain or discomfort. No bruising or broken skin noted. Patient states "I just want to get back in bed". Vital signs were taken. MD and [facility] notified. Vital signs to be taken q4hr for 48 hours. Patient reeducated regarding safety and use of the call bell and bedside phone to call for assistance. Bed alarm activated. Patient wearing yellow non-skid socks. Patient is in a room near nurses' station.

## 2017-05-01 NOTE — Progress Notes (Signed)
Physical Therapy Treatment Patient Details Name: Renee Cowan MRN: [id number] DOB: [birth year] Today's Date: 05/01/2017    History of Present Illness Pt is a 71 y/o F who presented with worsening dizziness and imbalance with a fall in the ED.  She additionally was experiencing blurred vision, nausea, and vomiting.  The pt has a h/o chronic vertigo and has seen her ENT for this (was told it was viral in origin) and has been going to PT for her imbalance.  MRI of the brain completed and suspecting lymphoma.  Pt's PMH inculdes concussion, fibromyalgia, osteoporosis, RA.      PT Comments    Pt agreeable to PT; pt denies pain, but notes anxious to "do something". Mod I with bed mobility, but noted dizziness upon sit that does not improve. Pt reports seeing double. Seated exercises performed without much change in dizziness. Pt continues to scoot forward at edge of bed with poor awareness of bed edge and requires cueing to prevent slipping forward off bed. Pt agreeable to stand exercises at edge of bed. Assisted stand demonstrates lean to the R with inability of pt to correct without assist despite sensing the right lean (pt does refer to it as left lean). Poor endurance in stand as well as decreased safety. Continue PT to progress strength, endurance, balance and safety awareness to improve safe functional mobility.   Follow Up Recommendations        Equipment Recommendations       Recommendations for Other Services       Precautions / Restrictions Precautions Precautions: Fall;Other (comment) Restrictions Weight Bearing Restrictions: No    Mobility  Bed Mobility Overal bed mobility: Modified Independent       Supine to sit: Modified independent (Device/Increase time) Sit to supine: Modified independent (Device/Increase time)   General bed mobility comments: Cues to   Transfers Overall transfer level: Needs assistance Equipment used: Rolling walker (2 wheeled) Transfers: Sit  to/from Stand Sit to Stand: Min assist         General transfer comment: R lean, mild dizziness. Impulsiveness with STS with poor use of hands and safety due to dizziness. Pt notes she is leaning, but unable to correct without cues with EO or EC  Ambulation/Gait             General Gait Details: Deferred for safety   Stairs            Wheelchair Mobility    Modified Rankin (Stroke Patients Only)       Balance Overall balance assessment: Needs assistance;History of Falls Sitting-balance support: Feet supported;Bilateral upper extremity supported Sitting balance-Leahy Scale: Fair     Standing balance support: Bilateral upper extremity supported Standing balance-Leahy Scale: Poor Standing balance comment: Needs assist to maintain neutral upright balance                            Cognition Arousal/Alertness: Awake/alert Behavior During Therapy: Impulsive Overall Cognitive Status: Impaired/Different from baseline Area of Impairment: Orientation;Following commands;Safety/judgement                 Orientation Level: Disoriented to;Place     Following Commands: Follows multi-step commands inconsistently Safety/Judgement: Decreased awareness of safety;Decreased awareness of deficits     General Comments: Poor awareness of surroundings; acknowledges deficits, but does not respond to them with caution and requires cues on how to correct balance without cues. Complains of double vision      Exercises  General Exercises - Lower Extremity Ankle Circles/Pumps: AROM;Both;20 reps;Supine Quad Sets: Strengthening;Both;15 reps;Seated Gluteal Sets: Strengthening;Both;15 reps;Supine Long Arc Quad: AROM;Both;20 reps;Seated Heel Slides: AROM;Both;Supine;15 reps Hip ABduction/ADduction: AROM;Both;20 reps;Seated(straight leg) Straight Leg Raises: Strengthening;Right;10 reps;Standing(2 sets) Hip Flexion/Marching: AROM;Both;20 reps;Seated;Other (comment)(also  in stand 2 sets of 10) Toe Raises: AROM;Both;20 reps;Seated Heel Raises: AROM;Both;20 reps;Seated Other Exercises Other Exercises: static stand with self righting Other Exercises: static stand with perturbations in neutral    General Comments        Pertinent Vitals/Pain Pain Assessment: No/denies pain    Home Living                      Prior Function            PT Goals (current goals can now be found in the care plan section) Progress towards PT goals: Progressing toward goals(slowly)    Frequency    Min 2X/week      PT Plan Current plan remains appropriate    Co-evaluation              AM-PAC PT "6 Clicks" Daily Activity  Outcome Measure  Difficulty turning over in bed (including adjusting bedclothes, sheets and blankets)?: None Difficulty moving from lying on back to sitting on the side of the bed? : None Difficulty sitting down on and standing up from a chair with arms (e.g., wheelchair, bedside commode, etc,.)?: Unable Help needed moving to and from a bed to chair (including a wheelchair)?: A Lot Help needed walking in hospital room?: A Lot Help needed climbing 3-5 steps with a railing? : A Lot 6 Click Score: 15    End of Session Equipment Utilized During Treatment: Gait belt Activity Tolerance: Patient limited by fatigue;Other (comment)(dizziness, double vision, poor balance) Patient left: in bed;with call bell/phone within reach;with bed alarm set;with family/visitor present   PT Visit Diagnosis: Unsteadiness on feet (R26.81);History of falling (Z91.81);Other abnormalities of gait and mobility (R26.89)     Time: 5573-2202 PT Time Calculation (min) (ACUTE ONLY): 38 min  Charges:  $Therapeutic Exercise: 23-37 mins $Neuromuscular Re-education: 8-22 mins                    G Codes:        Larae Grooms, PTA 05/01/2017, 2:01 PM

## 2017-05-01 NOTE — Progress Notes (Signed)
[place] at [place] NAME: Renee Cowan    MR#:  [id number]  DATE OF BIRTH:  [birth year]  SUBJECTIVE:  CHIEF COMPLAINT: Patient is resting comfortably.  Had a fall last night while going to the bathroom but denies any injuries  REVIEW OF SYSTEMS:  CONSTITUTIONAL: No fever, fatigue or weakness.  Reporting dizziness EYES: No blurred or double vision.  EARS, NOSE, AND THROAT: No tinnitus or ear pain.  RESPIRATORY: No cough, shortness of breath, wheezing or hemoptysis.  CARDIOVASCULAR: No chest pain, orthopnea, edema.  GASTROINTESTINAL: No nausea, vomiting, diarrhea or abdominal pain.  GENITOURINARY: No dysuria, hematuria.  ENDOCRINE: No polyuria, nocturia,  HEMATOLOGY: No anemia, easy bruising or bleeding SKIN: No rash or lesion. MUSCULOSKELETAL: No joint pain or arthritis.   NEUROLOGIC: No tingling, numbness, weakness.  PSYCHIATRY: No anxiety or depression.   DRUG ALLERGIES:   Allergies  Allergen Reactions  . Penicillins Rash  . Sulfa Antibiotics Rash    VITALS:  Blood pressure (!) 179/92, pulse 83, temperature (!) 100.4 F (38 C), temperature source Oral, resp. rate 20, height 5\' 2"  (1.575 m), weight 49.9 kg (109 lb 14.4 oz), SpO2 98 %.  PHYSICAL EXAMINATION:  GENERAL:  71 y.o.-year-old patient lying in the bed with no acute distress.  EYES: Pupils equal, round, reactive to light and accommodation. No scleral icterus. Extraocular muscles intact.  HEENT: Head atraumatic, normocephalic. Oropharynx and nasopharynx clear.  NECK:  Supple, no jugular venous distention. No thyroid enlargement, no tenderness.  LUNGS: Normal breath sounds bilaterally, no wheezing, rales,rhonchi or crepitation. No use of accessory muscles of respiration.  CARDIOVASCULAR: S1, S2 normal. No murmurs, rubs, or gallops.  ABDOMEN: Soft, nontender, nondistended. Bowel sounds present. No organomegaly or mass.  EXTREMITIES: No pedal edema, cyanosis, or  clubbing.  NEUROLOGIC: Cranial nerves II through XII are intact. Muscle strength 5/5 in all extremities. Sensation intact. Gait not checked.  Reports unsteady gait PSYCHIATRIC: The patient is alert and oriented x 3.  SKIN: No obvious rash, lesion, or ulcer.    LABORATORY PANEL:   CBC Recent Labs  Lab 04/30/17 0436  WBC 12.4*  HGB 13.0  HCT 37.7  PLT 339   ------------------------------------------------------------------------------------------------------------------  Chemistries  Recent Labs  Lab 04/29/17 0450  NA 133*  K 3.6  CL 99*  CO2 26  GLUCOSE 119*  BUN 12  CREATININE 0.53  CALCIUM 8.3*   ------------------------------------------------------------------------------------------------------------------  Cardiac Enzymes No results for input(s): TROPONINI in the last 168 hours. ------------------------------------------------------------------------------------------------------------------  RADIOLOGY:  No results found.  EKG:   Orders placed or performed during the hospital encounter of 04/28/17  . ED EKG  . ED EKG  . EKG 12-Lead  . EKG 12-Lead  . 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 Doy Mince and oncology Dr.yu,recommending neurosurgery evaluation for biopsy as soon as possible ,strongly recommending to avoid corticosteroids until biopsy is done.Will transfer patient to [facility] care center , patient is agreeable as she might need inpatient chemotherapy -high-dose methotrexate.  Patient was accepted by Dr. Johnney Killian, neurosurgery at [facility] precautions, out of bed with assistance only  UTI continue Keflex for a total of 7 days Has low-grade fever with leukocytosis we will get blood cultures, urine cultures and chest x-ray   Hyponatremia. Normal saline IV in the follow-up BMP.  Hypertension. IV hydralazine as  needed.  Diabetes. Sliding scale.  Tobacco abuse. Smoking cessation was provided for 4 minutes.         All the records are reviewed and case discussed with Care Management/Social Workerr. Management plans discussed with the patient, family and they are in agreement.  CODE STATUS:fc   TOTAL TIME TAKING CARE OF THIS PATIENT: 36  minutes.   POSSIBLE D/C ? DAYS, DEPENDING ON CLINICAL CONDITION.  Awaiting for bed at [facility] to transfer the patient  Note: This dictation was prepared with Dragon dictation along with smaller phrase technology. Any transcriptional errors that result from this process are unintentional.   Nicholes Mango M.D on 05/01/2017 at 3:36 PM  Between 7am to 6pm - Pager - [phone] After 6pm go to [email or web address] - password [email or web address] [facility]  [place] [facility]  Office  [phone]  CC: Primary care physician; Steele Sizer, MD

## 2017-05-02 ENCOUNTER — Ambulatory Visit: Payer: Medicare Other | Admitting: Physical Therapy

## 2017-05-02 LAB — GLUCOSE, CAPILLARY
GLUCOSE-CAPILLARY: 147 mg/dL — AB (ref 65–99)
GLUCOSE-CAPILLARY: 132 mg/dL — AB (ref 65–99)
Glucose-Capillary: 152 mg/dL — ABNORMAL HIGH (ref 65–99)
Glucose-Capillary: 117 mg/dL — ABNORMAL HIGH (ref 65–99)

## 2017-05-02 NOTE — Progress Notes (Signed)
Medications administered by student RN [id number] with supervision of Clinical Instructor Jamesyn Lindell MSN, RN-BC or patient's assigned RN.   

## 2017-05-02 NOTE — Progress Notes (Signed)
[place] at [place] NAME: Renee Cowan    MR#:  [id number]  DATE OF BIRTH:  [birth year]  SUBJECTIVE:  CHIEF COMPLAINT:   Anxious about not having a bed yet at [facility] Un[facility] gait  REVIEW OF SYSTEMS:  CONSTITUTIONAL: No fever, fatigue or weakness.  Reporting dizziness EYES: No blurred or double vision.  EARS, NOSE, AND THROAT: No tinnitus or ear pain.  RESPIRATORY: No cough, shortness of breath, wheezing or hemoptysis.  CARDIOVASCULAR: No chest pain, orthopnea, edema.  GASTROINTESTINAL: No nausea, vomiting, diarrhea or abdominal pain.  GENITOURINARY: No dysuria, hematuria.  ENDOCRINE: No polyuria, nocturia,  HEMATOLOGY: No anemia, easy bruising or bleeding SKIN: No rash or lesion. MUSCULOSKELETAL: No joint pain or arthritis.   NEUROLOGIC: No tingling, numbness, weakness.  PSYCHIATRY: No anxiety or depression.   DRUG ALLERGIES:   Allergies  Allergen Reactions  . Penicillins Rash  . Sulfa Antibiotics Rash    VITALS:  Blood pressure (!) 124/92, pulse 72, temperature 98.3 F (36.8 C), temperature source Oral, resp. rate 16, height 5\' 2"  (1.575 m), weight 109 lb 14.4 oz (49.9 kg), SpO2 97 %.  PHYSICAL EXAMINATION:  GENERAL:  71 y.o.-year-old patient lying in the bed with no acute distress.  EYES: Pupils equal, round, reactive to light and accommodation. No scleral icterus. Extraocular muscles intact.  HEENT: Head atraumatic, normocephalic. Oropharynx and nasopharynx clear.  NECK:  Supple, no jugular venous distention. No thyroid enlargement, no tenderness.  LUNGS: Normal breath sounds bilaterally, no wheezing, rales,rhonchi or crepitation. No use of accessory muscles of respiration.  CARDIOVASCULAR: S1, S2 normal. No murmurs, rubs, or gallops.  ABDOMEN: Soft, nontender, nondistended. Bowel sounds present. No organomegaly or mass.  EXTREMITIES: No pedal edema, cyanosis, or clubbing.  NEUROLOGIC: Cranial nerves II through XII  are intact. Muscle strength 5/5 in all extremities. Sensation intact. Gait not checked.  Reports unsteady gait PSYCHIATRIC: The patient is alert and oriented x 3.  SKIN: No obvious rash, lesion, or ulcer.    LABORATORY PANEL:   CBC Recent Labs  Lab 04/30/17 0436  WBC 12.4*  HGB 13.0  HCT 37.7  PLT 339   ------------------------------------------------------------------------------------------------------------------  Chemistries  Recent Labs  Lab 04/29/17 0450  NA 133*  K 3.6  CL 99*  CO2 26  GLUCOSE 119*  BUN 12  CREATININE 0.53  CALCIUM 8.3*   ------------------------------------------------------------------------------------------------------------------  Cardiac Enzymes No results for input(s): TROPONINI in the last 168 hours. ------------------------------------------------------------------------------------------------------------------  RADIOLOGY:  Dg Chest 2 View  Result Date: 05/01/2017 CLINICAL DATA:  Onset of shortness of breath today. History of a brain tumor, hypertension, current smoker, lymphoma, diabetes. EXAM: CHEST  2 VIEW COMPARISON:  Chest x-ray of February 15, 2017. FINDINGS: The lungs are adequately inflated and clear. The heart and pulmonary vascularity are normal. The mediastinum is normal in width. There is no pleural effusion. The trachea is midline. The bony thorax exhibits no acute abnormality. IMPRESSION: No active cardiopulmonary disease. Electronically Signed   By: David  Martinique M.D.   On: 05/01/2017 16:42    EKG:   Orders placed or performed during the hospital encounter of 04/28/17  . ED EKG  . ED EKG  . EKG 12-Lead  . EKG 12-Lead  . 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 for brain biopsy  UTI continue Keflex for a total of 7 days Had low-grade temperature yesterday could  be related to #1 above with lymphoma Blood  cultures from yesterday no growth  Hyponatremia. Normal saline IV in the follow-up BMP.  Hypertension. IV hydralazine as needed.  Blood pressure overall stable  Diabetes. Sliding scale.  Blood glucose stable  Tobacco abuse. Smoking cessation was provided         All the records are reviewed and case discussed with Care Management/Social Workerr. Management plans discussed with the patient, family and they are in agreement.  CODE STATUS:fc   TOTAL TIME TAKING CARE OF THIS PATIENT: 61min minutes.   POSSIBLE D/C ? DAYS, DEPENDING ON CLINICAL CONDITION.  Awaiting for bed at [facility] to transfer the patient  Note: This dictation was prepared with Dragon dictation along with smaller phrase technology. Any transcriptional errors that result from this process are unintentional.   Dustin Flock M.D on 05/02/2017 at 1:49 PM  Between 7am to 6pm - Pager - [phone] After 6pm go to [email or web address] - password [email or web address] [facility]  [place] [facility]  Office  [phone]  CC: Primary care physician; Steele Sizer, MD

## 2017-05-03 DIAGNOSIS — M797 Fibromyalgia: Secondary | ICD-10-CM

## 2017-05-03 DIAGNOSIS — M8580 Other specified disorders of bone density and structure, unspecified site: Secondary | ICD-10-CM

## 2017-05-03 DIAGNOSIS — R270 Ataxia, unspecified: Secondary | ICD-10-CM

## 2017-05-03 DIAGNOSIS — R530 Neoplastic (malignant) related fatigue: Secondary | ICD-10-CM

## 2017-05-03 DIAGNOSIS — E119 Type 2 diabetes mellitus without complications: Secondary | ICD-10-CM

## 2017-05-03 DIAGNOSIS — R42 Dizziness and giddiness: Secondary | ICD-10-CM

## 2017-05-03 DIAGNOSIS — R531 Weakness: Secondary | ICD-10-CM

## 2017-05-03 DIAGNOSIS — F329 Major depressive disorder, single episode, unspecified: Secondary | ICD-10-CM

## 2017-05-03 DIAGNOSIS — R930 Abnormal findings on diagnostic imaging of skull and head, not elsewhere classified: Secondary | ICD-10-CM

## 2017-05-03 DIAGNOSIS — Z79899 Other long term (current) drug therapy: Secondary | ICD-10-CM

## 2017-05-03 DIAGNOSIS — R634 Abnormal weight loss: Secondary | ICD-10-CM

## 2017-05-03 DIAGNOSIS — E559 Vitamin D deficiency, unspecified: Secondary | ICD-10-CM

## 2017-05-03 DIAGNOSIS — C8591 Non-Hodgkin lymphoma, unspecified, lymph nodes of head, face, and neck: Secondary | ICD-10-CM

## 2017-05-03 DIAGNOSIS — E039 Hypothyroidism, unspecified: Secondary | ICD-10-CM

## 2017-05-03 DIAGNOSIS — I10 Essential (primary) hypertension: Secondary | ICD-10-CM

## 2017-05-03 DIAGNOSIS — E785 Hyperlipidemia, unspecified: Secondary | ICD-10-CM

## 2017-05-03 LAB — CBC
HCT: 37.8 % (ref 35.0–47.0)
HEMOGLOBIN: 13 g/dL (ref 12.0–16.0)
MCH: 32.8 pg (ref 26.0–34.0)
MCHC: 34.4 g/dL (ref 32.0–36.0)
MCV: 95.3 fL (ref 80.0–100.0)
Platelets: 344 10*3/uL (ref 150–440)
RBC: 3.97 MIL/uL (ref 3.80–5.20)
RDW: 14 % (ref 11.5–14.5)
WBC: 9.6 10*3/uL (ref 3.6–11.0)

## 2017-05-03 LAB — GLUCOSE, CAPILLARY
GLUCOSE-CAPILLARY: 120 mg/dL — AB (ref 65–99)
GLUCOSE-CAPILLARY: 120 mg/dL — AB (ref 65–99)
Glucose-Capillary: 116 mg/dL — ABNORMAL HIGH (ref 65–99)
Glucose-Capillary: 142 mg/dL — ABNORMAL HIGH (ref 65–99)

## 2017-05-03 LAB — URINE CULTURE: Culture: <10000 — AB

## 2017-05-03 LAB — CREATININE, SERUM
Creatinine, Ser: 0.53 mg/dL (ref 0.44–1.00)
GFR calc Af Amer: >60 mL/min (ref >60)
GFR calc non Af Amer: >60 mL/min (ref >60)

## 2017-05-03 MED ORDER — DRONABINOL 2.5 MG PO CAPS
5.0000 mg | ORAL_CAPSULE | Freq: Two times a day (BID) | ORAL | Status: DC
  Administered 2017-05-03 (×2): 5 mg via ORAL
  Filled 2017-05-03 (×2): qty 2

## 2017-05-03 MED ORDER — HEPARIN SODIUM (PORCINE) 5000 UNIT/ML IJ SOLN: 5000.0000 [IU] | Freq: Three times a day (TID) | INTRAMUSCULAR | Status: DC

## 2017-05-03 MED ORDER — HEPARIN SODIUM (PORCINE) 5000 UNIT/ML IJ SOLN
5000.0000 [IU] | Freq: Three times a day (TID) | INTRAMUSCULAR | Status: DC
  Administered 2017-05-03: 5000 [IU] via SUBCUTANEOUS
  Filled 2017-05-03: qty 1

## 2017-05-03 NOTE — Plan of Care (Signed)
  Progressing Education: Knowledge of General Education information will improve 05/03/2017 1758 - Progressing by Cherylann Parr, RN Health Behavior/Discharge Planning: Ability to manage health-related needs will improve 05/03/2017 1758 - Progressing by Cherylann Parr, RN Clinical Measurements: Ability to maintain clinical measurements within normal limits will improve 05/03/2017 1758 - Progressing by Cherylann Parr, RN Will remain free from infection 05/03/2017 1758 - Progressing by Cherylann Parr, RN Diagnostic test results will improve 05/03/2017 1758 - Progressing by Cherylann Parr, RN Respiratory complications will improve 05/03/2017 1758 - Progressing by Cherylann Parr, RN Cardiovascular complication will be avoided 05/03/2017 1758 - Progressing by Cherylann Parr, RN Safety: Ability to remain free from injury will improve 05/03/2017 1758 - Progressing by Cherylann Parr, RN

## 2017-05-03 NOTE — Progress Notes (Signed)
Hematology/Oncology Progress Note [facility] Telephone:([phone] [phone]  Patient Care Team: Steele Sizer, MD as PCP - General (Family Medicine) Gavin Pound, MD as Consulting Physician (Rheumatology)   Name of the patient: Renee Cowan  [id number]  Dec 16, 1945  Date of visit: 05/03/17   INTERVAL HISTORY- Patient is waiting for a bed to be transferred to [facility]. She was offered to be transferred to other facility but she'd rather wait to go to [facility].  Reports poor appetite and weakness. Other neurological symptoms remains the same.     Review of systems- Review of Systems  HENT: Negative for hearing loss.   Eyes: Positive for blurred vision.  Respiratory: Negative for cough.   Cardiovascular: Negative for chest pain.  Gastrointestinal: Negative for heartburn.  Genitourinary: Negative for dysuria.  Musculoskeletal: Negative for myalgias.  Skin: Negative for rash.  Neurological: Positive for dizziness and weakness.  Endo/Heme/Allergies: Bruises/bleeds easily.  Psychiatric/Behavioral: Negative for depression.    Allergies  Allergen Reactions  . Penicillins Rash  . Sulfa Antibiotics Rash    Patient Active Problem List   Diagnosis Date Noted  . 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 GAD (generalized anxiety disorder) 04/25/2017  . Moderate recurrent major depression ([place]) 04/25/2017  . Type 2 diabetes mellitus with microalbuminuria, without long-term current use of insulin ([place]) 11/28/2015  . Benign essential HTN 05/26/2015  . Depression, major, in remission ([place]) 05/26/2015  . Dyslipidemia 05/26/2015  . Type 2 diabetes mellitus with renal manifestations, controlled ([place]) 05/26/2015  . Fibromyalgia syndrome 05/26/2015  . Personal history of malignant melanoma of skin 05/26/2015  . Adult hypothyroidism 05/26/2015  . Osteopenia after menopause 05/26/2015  . Tobacco use 05/26/2015  . Vitamin D deficiency 05/26/2015  . Rheumatoid  arthritis ([place]) 05/26/2015  . Acquired trigger finger 10/11/2007     Past Medical History:  Diagnosis Date  . Allergy   . Cataract   . Concussion   . Depression   . Fibromyalgia   . Hyperlipidemia   . Hypertension   . Hypothyroidism   . Osteoporosis   . Rheumatoid arteritis   . Trigger finger   . Vitamin D deficiency      Past Surgical History:  Procedure Laterality Date  . CATARACT EXTRACTION, BILATERAL Bilateral [id number] and [id number]  . CHOLECYSTECTOMY    . CYST REMOVAL TRUNK    . melaoma excesion    . trigger thumb release      Social History   Socioeconomic History  . Marital status: Married    Spouse name: Not on file  . Number of children: Not on file  . Years of education: Not on file  . Highest education level: Not on file  Social Needs  . Financial resource strain: Not on file  . Food insecurity - worry: Not on file  . Food insecurity - inability: Not on file  . Transportation needs - medical: Not on file  . Transportation needs - non-medical: Not on file  Occupational History  . Not on file  Tobacco Use  . Smoking status: Current Every Day Smoker    Packs/day: 0.50    Years: 40.00    Pack years: 20.00    Types: Cigarettes    Start date: 12/03/1976  . Smokeless tobacco: Never Used  Substance and Sexual Activity  . Alcohol use: Yes    Alcohol/week: 0.0 oz    Comment: occasionally  . Drug use: No  . Sexual activity: No  Other Topics Concern  . Not on file  Social History Narrative  . Not on file     Family History  Problem Relation Age of Onset  . Cancer Mother        cervical  . Tuberculosis Father   . Kidney cancer Neg Hx   . Bladder Cancer Neg Hx      Current Facility-Administered Medications:  .  0.9 %  sodium chloride infusion, 250 mL, Intravenous, PRN, Demetrios Loll, MD .  acetaminophen (TYLENOL) tablet 650 mg, 650 mg, Oral, Q6H PRN, 650 mg at 05/02/17 0832 **OR** acetaminophen (TYLENOL) suppository 650 mg, 650 mg, Rectal, Q6H  PRN, Demetrios Loll, MD .  albuterol (PROVENTIL) (2.5 MG/3ML) 0.083% nebulizer solution 2.5 mg, 2.5 mg, Nebulization, Q2H PRN, Demetrios Loll, MD .  bisacodyl (DULCOLAX) EC tablet 5 mg, 5 mg, Oral, Daily PRN, Demetrios Loll, MD .  cephALEXin [facility]) capsule 500 mg, 500 mg, Oral, Q12H, Gouru, Aruna, MD, 500 mg at 05/03/17 2008 .  dronabinol (MARINOL) capsule 5 mg, 5 mg, Oral, BID AC, Dustin Flock, MD, 5 mg at 05/03/17 1726 .  DULoxetine (CYMBALTA) DR capsule 60 mg, 60 mg, Oral, Daily, Demetrios Loll, MD, 60 mg at 05/03/17 0258 .  feeding supplement (ENSURE ENLIVE) (ENSURE ENLIVE) liquid 237 mL, 237 mL, Oral, TID BM, Gouru, Aruna, MD, 237 mL at 05/03/17 1236 .  folic acid (FOLVITE) tablet 1 mg, 1 mg, Oral, Daily, Demetrios Loll, MD, 1 mg at 05/03/17 0808 .  heparin injection 5,000 Units, 5,000 Units, Subcutaneous, Q8H, Lenis Noon, [place], 5,000 Units at 05/03/17 2008 .  hydrALAZINE (APRESOLINE) injection 10 mg, 10 mg, Intravenous, Q6H PRN, Demetrios Loll, MD, 10 mg at 05/02/17 0418 .  HYDROcodone-acetaminophen (NORCO/VICODIN) 5-325 MG per tablet 1-2 tablet, 1-2 tablet, Oral, Q4H PRN, Demetrios Loll, MD, 2 tablet at 04/29/17 1307 .  insulin aspart (novoLOG) injection 0-5 Units, 0-5 Units, Subcutaneous, QHS, Demetrios Loll, MD .  insulin aspart (novoLOG) injection 0-9 Units, 0-9 Units, Subcutaneous, TID WC, Demetrios Loll, MD, 1 Units at 05/03/17 1220 .  levothyroxine (SYNTHROID, LEVOTHROID) tablet 50 mcg, 50 mcg, Oral, QAC breakfast, Demetrios Loll, MD, 50 mcg at 05/03/17 [phone] .  magnesium oxide (MAG-OX) tablet 400 mg, 400 mg, Oral, Daily, Demetrios Loll, MD, 400 mg at 05/03/17 8242 .  metoprolol tartrate (LOPRESSOR) injection 5 mg, 5 mg, Intravenous, Q4H PRN, Gouru, Aruna, MD, 5 mg at 05/01/17 0834 .  metoprolol tartrate (LOPRESSOR) tablet 50 mg, 50 mg, Oral, BID, Gouru, Aruna, MD, 50 mg at 05/03/17 2008 .  multivitamin with minerals tablet 1 tablet, 1 tablet, Oral, Daily, Gouru, Aruna, MD, 1 tablet at 05/03/17 0808 .  ondansetron  (ZOFRAN) tablet 4 mg, 4 mg, Oral, Q6H PRN **OR** ondansetron (ZOFRAN) injection 4 mg, 4 mg, Intravenous, Q6H PRN, Demetrios Loll, MD, 4 mg at 04/29/17 0219 .  senna-docusate (Senokot-S) tablet 1 tablet, 1 tablet, Oral, QHS PRN, Demetrios Loll, MD .  sodium chloride flush (NS) 0.9 % injection 3 mL, 3 mL, Intravenous, Q12H, Demetrios Loll, MD, 3 mL at 05/03/17 2012 .  sodium chloride flush (NS) 0.9 % injection 3 mL, 3 mL, Intravenous, PRN, Demetrios Loll, MD   Physical exam:  Vitals:   05/03/17 0442 05/03/17 0812 05/03/17 1140 05/03/17 1950  BP: (!) 176/88 (!) 165/88 (!) 172/85 (!) 156/81  Pulse: 72 69 63 79  Resp: 18  20   Temp: 99.6 F (37.6 C) 99 F (37.2 C) 99.8 F (37.7 C) 99.8 F (37.7 C)  TempSrc: Oral Oral Oral Oral  SpO2: 99% 97% 98% 99%  Weight:      Height:       GENERAL:  71 y.o.-year-old patient lying in the bed with no acute distress.  EYES: Pupils equal, round, reactive to light and accommodation. No scleral icterus. Extraocular muscles intact.  HEENT: Head atraumatic, normocephalic. Oropharynx and nasopharynx clear.  NECK:  Supple, no jugular venous distention. No thyroid enlargement, no tenderness.  LUNGS: Normal breath sounds bilaterally, no wheezing, rales,rhonchi or crepitation. No use of accessory muscles of respiration.  CARDIOVASCULAR: S1, S2 normal. No murmurs, rubs, or gallops.  ABDOMEN: Soft, nontender, nondistended. Bowel sounds present. No organomegaly or mass.  EXTREMITIES: No pedal edema, cyanosis, or clubbing.  NEUROLOGIC: Cranial nerves II through XII are intact. Muscle strength 5/5 in all extremities. Sensation intact. Gait not checked.  Reports unsteady gait PSYCHIATRIC: The patient is alert and oriented x 3.  SKIN: No obvious rash, lesion, or ulcer.       CMP Latest Ref Rng & Units 05/03/2017  Glucose 65 - 99 mg/dL -  BUN 6 - 20 mg/dL -  Creatinine 0.44 - 1.00 mg/dL 0.53  Sodium 135 - 145 mmol/L -  Potassium 3.5 - 5.1 mmol/L -  Chloride 101 - 111 mmol/L -    CO2 22 - 32 mmol/L -  Calcium 8.9 - 10.3 mg/dL -  Total Protein 6.1 - 8.1 g/dL -  Total Bilirubin 0.2 - 1.2 mg/dL -  Alkaline Phos 33 - 130 U/L -  AST 10 - 35 U/L -  ALT 6 - 29 U/L -   CBC Latest Ref Rng & Units 05/03/2017  WBC 3.6 - 11.0 K/uL 9.6  Hemoglobin 12.0 - 16.0 g/dL 13.0  Hematocrit 35.0 - 47.0 % 37.8  Platelets 150 - 440 K/uL 344    Negative Hepatitis B surface antigen, negative HIV.  Normal LDH.     Assessment and plan-   Patient is a 71 y.o. female presented with ataxia, generalized weakness with image findings suspicious for CNS lymphoma.  Advice patient to consider about being transferred to other tertiary center if [facility] does not have bed. Patient prefers to wait for the bed to go to [facility] but will consider. If changes her mind and willing to be transferred to other tertiary center, she will let RN know who will update hospitalist service.  I also recommend to obtain ophthalmology evaluation (Slit lamp examination of both eyes) here (if feasible while waiting to be transferred) or at the academic center to rule out ocular involvement.  Continue monitor neurologically.   Earlie Server, MD, PhD [facility] at [facility] Pager- [phone] 05/03/2017

## 2017-05-03 NOTE — Progress Notes (Signed)
[place] [facility] [place] NAME: Renee Cowan    MR#:  [id number]  DATE OF BIRTH:  1945/07/21  SUBJECTIVE:  CHIEF COMPLAINT:   Awaiting bed to [facility] I offered patient transfer to another facility she states that she would rather wait to go to [facility] Poor appetite  REVIEW OF SYSTEMS:  CONSTITUTIONAL: No fever, fatigue or weakness.  Reporting dizziness EYES: No blurred or double vision.  EARS, NOSE, AND THROAT: No tinnitus or ear pain.  RESPIRATORY: No cough, shortness of breath, wheezing or hemoptysis.  CARDIOVASCULAR: No chest pain, orthopnea, edema.  GASTROINTESTINAL: No nausea, vomiting, diarrhea or abdominal pain.  GENITOURINARY: No dysuria, hematuria.  ENDOCRINE: No polyuria, nocturia,  HEMATOLOGY: No anemia, easy bruising or bleeding SKIN: No rash or lesion. MUSCULOSKELETAL: No joint pain or arthritis.   NEUROLOGIC: No tingling, numbness, weakness.  PSYCHIATRY: No anxiety or depression.   DRUG ALLERGIES:   Allergies  Allergen Reactions  . Penicillins Rash  . Sulfa Antibiotics Rash    VITALS:  Blood pressure (!) 172/85, pulse 63, temperature 99.8 F (37.7 C), temperature source Oral, resp. rate 20, height 5\' 2"  (1.575 m), weight 109 lb 14.4 oz (49.9 kg), SpO2 98 %.  PHYSICAL EXAMINATION:  GENERAL:  71 y.o.-year-old patient lying in the bed with no acute distress.  EYES: Pupils equal, round, reactive to light and accommodation. No scleral icterus. Extraocular muscles intact.  HEENT: Head atraumatic, normocephalic. Oropharynx and nasopharynx clear.  NECK:  Supple, no jugular venous distention. No thyroid enlargement, no tenderness.  LUNGS: Normal breath sounds bilaterally, no wheezing, rales,rhonchi or crepitation. No use of accessory muscles of respiration.  CARDIOVASCULAR: S1, S2 normal. No murmurs, rubs, or gallops.  ABDOMEN: Soft, nontender, nondistended. Bowel sounds present. No organomegaly or mass.  EXTREMITIES: No  pedal edema, cyanosis, or clubbing.  NEUROLOGIC: Cranial nerves II through XII are intact. Muscle strength 5/5 in all extremities. Sensation intact. Gait not checked.  Reports unsteady gait PSYCHIATRIC: The patient is alert and oriented x 3.  SKIN: No obvious rash, lesion, or ulcer.    LABORATORY PANEL:   CBC Recent Labs  Lab 05/03/17 0705  WBC 9.6  HGB 13.0  HCT 37.8  PLT 344   ------------------------------------------------------------------------------------------------------------------  Chemistries  Recent Labs  Lab 04/29/17 0450 05/03/17 0705  NA 133*  --   K 3.6  --   CL 99*  --   CO2 26  --   GLUCOSE 119*  --   BUN 12  --   CREATININE 0.53 0.53  CALCIUM 8.3*  --    ------------------------------------------------------------------------------------------------------------------  Cardiac Enzymes No results for input(s): TROPONINI in the last 168 hours. ------------------------------------------------------------------------------------------------------------------  RADIOLOGY:  Dg Chest 2 View  Result Date: 05/01/2017 CLINICAL DATA:  Onset of shortness of breath today. History of a brain tumor, hypertension, current smoker, lymphoma, diabetes. EXAM: CHEST  2 VIEW COMPARISON:  Chest x-ray of February 15, 2017. FINDINGS: The lungs are adequately inflated and clear. The heart and pulmonary vascularity are normal. The mediastinum is normal in width. There is no pleural effusion. The trachea is midline. The bony thorax exhibits no acute abnormality. IMPRESSION: No active cardiopulmonary disease. Electronically Signed   By: David  Martinique M.D.   On: 05/01/2017 16:42    EKG:   Orders placed or performed during the hospital encounter of 04/28/17  . ED EKG  . ED EKG  . EKG 12-Lead  . EKG 12-Lead  . EKG    ASSESSMENT AND PLAN:  Ataxia-could be secondary tolymphomaof Brain. 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 Normal saline IV in the follow-up BMP.  Hypertension. IV hydralazine as needed.  Blood pressure overall stable  Diabetes. Sliding scale.  Blood glucose stable  Tobacco abuse. Smoking cessation was provided         All the records are reviewed and case discussed with Care Management/Social Workerr. Management plans discussed with the patient, family and they are in agreement.  CODE STATUS:fc   TOTAL TIME TAKING CARE OF THIS PATIENT: 50min minutes.   POSSIBLE D/C ? DAYS, DEPENDING ON CLINICAL CONDITION.  Awaiting for bed at [facility] to transfer the patient  Note: This dictation was prepared with Dragon dictation along with smaller phrase technology. Any transcriptional errors that result from this process are unintentional.   Dustin Flock M.D on 05/03/2017 at 1:20 PM  Between 7am to 6pm - Pager - [phone] After 6pm go to [email or web address] - password [email or web address]  [facility]  Office  [phone]  CC: Primary care physician; Steele Sizer, MD

## 2017-05-03 NOTE — Progress Notes (Signed)
Physical Therapy Treatment Patient Details Name: Renee Cowan MRN: [id number] DOB: [birth year] Today's Date: 05/03/2017    History of Present Illness 71 y/o F who presented with worsening dizziness and imbalance with a fall in the ED.  She additionally was experiencing blurred vision, nausea, and vomiting.  The pt has a h/o chronic vertigo and has seen her ENT for this (was told it was viral in origin) and has been going to PT for her imbalance.  MRI of the brain completed and suspecting lymphoma.  Pt's PMH inculdes concussion, fibromyalgia, osteoporosis, RA.      PT Comments    Pt was able to do more mobility/ambulation than she has yet but proved to be very unsafe with standing activities.  She leaned heavily to the R and though she showed great effort was very limited with what she was able to do.  She was able to participate well with exercises showing decent strength and good quality of motion but her dizziness/balance/double vision are a big safety concern.  Pt will need to make further functional gains to be able to go home, at this time PT feels she is not safe enough and will need to go to rehab.     Follow Up Recommendations  Home health PT;SNF;Supervision/Assistance - 24 hour(per progress, likely she is too unsteady to safely go home)     Equipment Recommendations  Rolling walker with 5" wheels    Recommendations for Other Services       Precautions / Restrictions Precautions Precautions: Fall;Other (comment) Restrictions Weight Bearing Restrictions: No    Mobility  Bed Mobility Overal bed mobility: Modified Independent Bed Mobility: Supine to Sit;Sit to Supine     Supine to sit: Modified independent (Device/Increase time) Sit to supine: Modified independent (Device/Increase time)   General bed mobility comments: Pt was able to get in/out of bed but showed slow, non-confident movement  Transfers Overall transfer level: Needs assistance Equipment used: Rolling  walker (2 wheeled) Transfers: Sit to/from Stand Sit to Stand: Min assist         General transfer comment: R lean, mild dizziness. Impulsiveness with STS with poor use of hands and safety due to dizziness. Pt notes she is leaning, but unable to correct without cues with EO or EC  Ambulation/Gait Ambulation/Gait assistance: Min assist Ambulation Distance (Feet): 35 Feet Assistive device: Rolling walker (2 wheeled)       General Gait Details: Pt leaning to the R t/o the effort and though she did not have any overt LOBs she was unsteady and clearly lacked confidence.  Heavily reliant on walker.   Stairs            Wheelchair Mobility    Modified Rankin (Stroke Patients Only)       Balance Overall balance assessment: Needs assistance;History of Falls Sitting-balance support: Feet supported;Bilateral upper extremity supported Sitting balance-Leahy Scale: Fair     Standing balance support: Bilateral upper extremity supported Standing balance-Leahy Scale: Poor Standing balance comment: Pt leaning to the R, struggles to shift L even with verbal and tactile cues                            Cognition Arousal/Alertness: Awake/alert Behavior During Therapy: WFL for tasks assessed/performed Overall Cognitive Status: Impaired/Different from baseline  Exercises General Exercises - Lower Extremity Ankle Circles/Pumps: AROM;Both;20 reps;Supine Heel Slides: Strengthening;10 reps Hip ABduction/ADduction: Strengthening;10 reps Straight Leg Raises: AROM;10 reps    General Comments        Pertinent Vitals/Pain Pain Assessment: No/denies pain    Home Living                      Prior Function            PT Goals (current goals can now be found in the care plan section) Progress towards PT goals: Progressing toward goals    Frequency    Min 2X/week      PT Plan Discharge plan needs to  be updated    Co-evaluation              AM-PAC PT "6 Clicks" Daily Activity  Outcome Measure  Difficulty turning over in bed (including adjusting bedclothes, sheets and blankets)?: None Difficulty moving from lying on back to sitting on the side of the bed? : A Little Difficulty sitting down on and standing up from a chair with arms (e.g., wheelchair, bedside commode, etc,.)?: Unable Help needed moving to and from a bed to chair (including a wheelchair)?: A Lot Help needed walking in hospital room?: A Lot Help needed climbing 3-5 steps with a railing? : Total 6 Click Score: 13    End of Session Equipment Utilized During Treatment: Gait belt Activity Tolerance: Patient limited by fatigue Patient left: in bed;with call bell/phone within reach;with bed alarm set;with family/visitor present   PT Visit Diagnosis: Unsteadiness on feet (R26.81);History of falling (Z91.81);Other abnormalities of gait and mobility (R26.89)     Time: 2633-3545 PT Time Calculation (min) (ACUTE ONLY): 27 min  Charges:  $Gait Training: 8-22 mins $Therapeutic Exercise: 8-22 mins                    G Codes:       Kreg Shropshire, DPT 05/03/2017, 5:53 PM

## 2017-05-04 NOTE — Progress Notes (Signed)
Patient transferred to [facility] via EMS to [place] [facility]. Report given to [employer], RN. Patient given discharge instructions. Patient's daughter - Montey Hora  took belongings with her- clothes , cell phone. Patient wore glasses and dentures.

## 2017-05-05 MED ORDER — CYCLOBENZAPRINE HCL 10 MG PO TABS
10.00 | ORAL_TABLET | ORAL | Status: DC
Start: ? — End: 2017-05-05

## 2017-05-05 MED ORDER — SENNOSIDES-DOCUSATE SODIUM 8.6-50 MG PO TABS
1.00 | ORAL_TABLET | ORAL | Status: DC
Start: 2017-05-08 — End: 2017-05-05

## 2017-05-05 MED ORDER — SODIUM CHLORIDE 0.9 % IV SOLN
500.00 | INTRAVENOUS | Status: DC
Start: 2017-05-05 — End: 2017-05-05

## 2017-05-05 MED ORDER — ONDANSETRON HCL 4 MG/2ML IJ SOLN
4.00 | INTRAMUSCULAR | Status: DC
Start: ? — End: 2017-05-05

## 2017-05-05 MED ORDER — POTASSIUM CHLORIDE ER 20 MEQ PO TBCR
40.00 | EXTENDED_RELEASE_TABLET | ORAL | Status: DC
Start: 2017-05-05 — End: 2017-05-05

## 2017-05-05 MED ORDER — CEFUROXIME AXETIL 500 MG PO TABS
500.00 | ORAL_TABLET | ORAL | Status: DC
Start: 2017-05-07 — End: 2017-05-05

## 2017-05-05 MED ORDER — FOLIC ACID 1 MG PO TABS
1.00 | ORAL_TABLET | ORAL | Status: DC
Start: 2017-05-08 — End: 2017-05-05

## 2017-05-05 MED ORDER — POLYETHYLENE GLYCOL 3350 17 G PO PACK
17.00 | PACK | ORAL | Status: DC
Start: ? — End: 2017-05-05

## 2017-05-05 MED ORDER — SENNOSIDES-DOCUSATE SODIUM 8.6-50 MG PO TABS
2.00 | ORAL_TABLET | ORAL | Status: DC
Start: ? — End: 2017-05-05

## 2017-05-05 MED ORDER — LIDOCAINE HCL 1 % IJ SOLN
0.50 | INTRAMUSCULAR | Status: DC
Start: ? — End: 2017-05-05

## 2017-05-05 MED ORDER — SODIUM CHLORIDE 1 G PO TABS
2.00 | ORAL_TABLET | ORAL | Status: DC
Start: 2017-05-07 — End: 2017-05-05

## 2017-05-05 MED ORDER — ACETAMINOPHEN 325 MG PO TABS
650.00 | ORAL_TABLET | ORAL | Status: DC
Start: ? — End: 2017-05-05

## 2017-05-05 MED ORDER — SODIUM CHLORIDE 0.9 % IV SOLN
INTRAVENOUS | Status: DC
Start: 2017-05-06 — End: 2017-05-05

## 2017-05-05 MED ORDER — LEVOTHYROXINE SODIUM 25 MCG PO TABS
50.00 | ORAL_TABLET | ORAL | Status: DC
Start: 2017-05-08 — End: 2017-05-05

## 2017-05-05 MED ORDER — DULOXETINE HCL 60 MG PO CPEP
60.00 | ORAL_CAPSULE | ORAL | Status: DC
Start: 2017-05-08 — End: 2017-05-05

## 2017-05-05 MED ORDER — LIDOCAINE HCL 1 % IJ SOLN
.50 | INTRAMUSCULAR | Status: DC
Start: ? — End: 2017-05-05

## 2017-05-05 MED ORDER — BISACODYL 5 MG PO TBEC
5.00 | DELAYED_RELEASE_TABLET | ORAL | Status: DC
Start: ? — End: 2017-05-05

## 2017-05-05 MED ORDER — ALBUTEROL SULFATE (5 MG/ML) 0.5% IN NEBU
2.50 | INHALATION_SOLUTION | RESPIRATORY_TRACT | Status: DC
Start: ? — End: 2017-05-05

## 2017-05-05 MED ORDER — HYDRALAZINE HCL 20 MG/ML IJ SOLN
10.00 | INTRAMUSCULAR | Status: DC
Start: ? — End: 2017-05-05

## 2017-05-05 MED ORDER — SODIUM CHLORIDE 0.9 % IJ SOLN
5.00 | INTRAMUSCULAR | Status: DC
Start: 2017-05-07 — End: 2017-05-05

## 2017-05-05 MED ORDER — GENERIC EXTERNAL MEDICATION
12.50 | Status: DC
Start: 2017-05-08 — End: 2017-05-05

## 2017-05-06 LAB — CULTURE, BLOOD (ROUTINE X 2)
CULTURE: NO GROWTH
CULTURE: NO GROWTH
SPECIAL REQUESTS: ADEQUATE

## 2017-05-08 MED ORDER — INSULIN LISPRO 100 UNIT/ML ~~LOC~~ SOLN
.00 | SUBCUTANEOUS | Status: DC
Start: 2017-05-07 — End: 2017-05-08

## 2017-05-08 MED ORDER — PANTOPRAZOLE SODIUM 40 MG PO TBEC
40.00 | DELAYED_RELEASE_TABLET | ORAL | Status: DC
Start: 2017-05-08 — End: 2017-05-08

## 2017-05-08 MED ORDER — DEXAMETHASONE SODIUM PHOSPHATE 4 MG/ML IJ SOLN
4.00 | INTRAMUSCULAR | Status: DC
Start: 2017-05-07 — End: 2017-05-08

## 2017-05-08 MED ORDER — SODIUM CHLORIDE 0.9 % IV SOLN
INTRAVENOUS | Status: DC
Start: ? — End: 2017-05-08

## 2017-05-08 MED ORDER — ACETAMINOPHEN 325 MG PO TABS
975.00 | ORAL_TABLET | ORAL | Status: DC
Start: 2017-05-07 — End: 2017-05-08

## 2017-05-08 MED ORDER — DEXTROSE 50 % IV SOLN
12.50 | INTRAVENOUS | Status: DC
Start: ? — End: 2017-05-08

## 2017-05-08 MED ORDER — OXYCODONE HCL 5 MG PO TABS
5.00 | ORAL_TABLET | ORAL | Status: DC
Start: ? — End: 2017-05-08

## 2017-05-08 MED ORDER — SENNOSIDES-DOCUSATE SODIUM 8.6-50 MG PO TABS
2.00 | ORAL_TABLET | ORAL | Status: DC
Start: ? — End: 2017-05-08

## 2017-05-08 MED ORDER — GLUCAGON HCL RDNA (DIAGNOSTIC) 1 MG IJ SOLR
1.00 | INTRAMUSCULAR | Status: DC
Start: ? — End: 2017-05-08

## 2017-05-08 MED ORDER — BISACODYL 5 MG PO TBEC
5.00 | DELAYED_RELEASE_TABLET | ORAL | Status: DC
Start: ? — End: 2017-05-08

## 2017-05-08 MED ORDER — POLYETHYLENE GLYCOL 3350 17 G PO PACK
17.00 | PACK | ORAL | Status: DC
Start: ? — End: 2017-05-08

## 2017-05-09 ENCOUNTER — Ambulatory Visit: Payer: Medicare Other | Admitting: Family Medicine

## 2017-05-09 ENCOUNTER — Telehealth: Payer: Self-pay | Admitting: Family Medicine

## 2017-05-09 ENCOUNTER — Encounter: Payer: Self-pay | Admitting: Family Medicine

## 2017-05-09 ENCOUNTER — Encounter: Payer: Medicare Other | Admitting: Physical Therapy

## 2017-05-09 VITALS — BP 142/80 | HR 92 | Temp 98.5°F | Resp 16 | Ht 62.0 in | Wt 108.0 lb

## 2017-05-09 DIAGNOSIS — R270 Ataxia, unspecified: Secondary | ICD-10-CM

## 2017-05-09 DIAGNOSIS — R269 Unspecified abnormalities of gait and mobility: Secondary | ICD-10-CM

## 2017-05-09 DIAGNOSIS — R410 Disorientation, unspecified: Secondary | ICD-10-CM

## 2017-05-09 DIAGNOSIS — C8589 Other specified types of non-Hodgkin lymphoma, extranodal and solid organ sites: Secondary | ICD-10-CM

## 2017-05-09 DIAGNOSIS — I10 Essential (primary) hypertension: Secondary | ICD-10-CM | POA: Diagnosis not present

## 2017-05-09 MED ORDER — METOPROLOL SUCCINATE ER 25 MG PO TB24: 12.5000 mg | ORAL_TABLET | Freq: Every day | ORAL | 0 refills | Status: DC

## 2017-05-09 NOTE — Telephone Encounter (Signed)
Referral placed. FMLA paperwork signed. Please ensure referral is completed/sent. Thanks so much!

## 2017-05-09 NOTE — Telephone Encounter (Addendum)
Please call home health - [facility]:  - Patient has had a major change in her health status - recently diagnosed with CNS lymphoma which has caused several symptoms including gait disturbance, ataxia, and visual changes.  She lives alone with no family living locally, and she is about to start treatment.  She will likely require 24/7 care - please find out if this will be possible through [facility], and if not, what their recommended next steps are. - Patient needs medication management and help bathing at the bare minimum right now.  PT from [facility] has already been out for initial assessment, but I would like these additional orders placed to ensure an RN is also involved in her medication management and care as well as someone to assist in ADL's like bathing. - I provided a written Rx for shower chair and toilet seat with hand rails to the patient.  Is [facility] able to provide this to the patient at home.  Please Call patient: Schedule follow up for 2-3 weeks to allow her time to attend her other visits. Need to monitor her progress and her blood pressure.

## 2017-05-09 NOTE — Addendum Note (Signed)
Addended by: Hubbard Hartshorn on: 05/09/2017 04:30 PM   Modules accepted: Orders

## 2017-05-09 NOTE — Progress Notes (Addendum)
Name: Renee Cowan   MRN: [id number]    DOB: 11/10/1945   Date:05/09/2017       Progress Note  Subjective  Chief Complaint  Chief Complaint  Patient presents with  . Hospitalization Follow-up    Seen at [facility] then transfered to [facility] for Brain Biopsy  . Medication Problem    Family confused of what medication pation should remain on  . Hypertension    HPI Patient presents for hospital admission follow up with her daughter Junie Panning and sister-in-law Deb who both assist in the history.  She was diagnosed with CNS lymphoma.   Current Symptoms: Dizziness intermittently worse when tired, leaning to the right, diplopia (left eye with clearer vision when she covers the right), ongoing ataxia, using a walker due to impaired gait and weakness, LEFT side of body feels weaker, hearing seems less on the left side, having a hard time finding the right words sometimes (worse when tired), has had episodes of confusion (worse when tired).   Dr. Johnney Killian performed brain biopsy 05/06/2017 while she was admitted, preliminary testing showed that the sample was viable, we are still awaiting results. They are expected to be returned in 5-7 days from now.  Current Appointments: Has appt with [facility] tomorrow (05/10/2017 Friday), then multiple appointments next week with [facility]. Monday 05/13/17 - [facility]; 05/13/17 - MRI at [facility]; 05/14/17 - PET Scan at [facility]; 05/14/17 - Consult with [facility].  Family would like to ensure patient has all appropriate referrals.    Home Health: [facility] came yesterday to assess patient, and the PT also recommended OT visit as well. Family would like to see if they can obtain shower seat and raised toilet seat to help patient.  Also would like assistance with medication management and assistance bathing.  Patient lives alone and her family is only visiting from out of town.  Daughter Junie Panning) leaves in 3 days, and her son will be  coming in to stay with her next week. Discussed the need to begin considering temporary placement in SNF or 24/7 in-home care. Patient and daughter verbalize understanding.  We will also reach out to [facility] on their behalf to connect them with the appropriate resources.  BP: Patient used to take metoprolol, was taken off because BP was well controlled. She was having elevated BP readings in the hospital, and was being given IV metoprolol and hydralazine PRN. She was not given an Rx for PO Metoprolol to return home with. BP today is 142/80. Daughters have the ability to check patient's BP at home daily if needed.  Patient Active Problem List   Diagnosis Date Noted  . Ataxia   . Abnormal MRI of head   . Lymphoma ([place]) 04/28/2017  . GAD (generalized anxiety disorder) 04/25/2017  . Moderate recurrent major depression ([place]) 04/25/2017  . Type 2 diabetes mellitus with microalbuminuria, without long-term current use of insulin ([place]) 11/28/2015  . Benign essential HTN 05/26/2015  . Depression, major, in remission ([place]) 05/26/2015  . Dyslipidemia 05/26/2015  . Type 2 diabetes mellitus with renal manifestations, controlled ([place]) 05/26/2015  . Fibromyalgia syndrome 05/26/2015  . Personal history of malignant melanoma of skin 05/26/2015  . Adult hypothyroidism 05/26/2015  . Osteopenia after menopause 05/26/2015  . Tobacco use 05/26/2015  . Vitamin D deficiency 05/26/2015  . Rheumatoid arthritis ([place]) 05/26/2015  . Acquired trigger finger 10/11/2007    Past Surgical History:  Procedure Laterality Date  .  CATARACT EXTRACTION, BILATERAL Bilateral [id number] and [id number]  . CHOLECYSTECTOMY    . CYST REMOVAL TRUNK    . melaoma excesion    . trigger thumb release      Family History  Problem Relation Age of Onset  . Cancer Mother        cervical  . Tuberculosis Father   . Kidney cancer Neg Hx   . Bladder Cancer Neg Hx     Social History   Socioeconomic History  . Marital status:  Married    Spouse name: Not on file  . Number of children: Not on file  . Years of education: Not on file  . Highest education level: Not on file  Social Needs  . Financial resource strain: Not on file  . Food insecurity - worry: Not on file  . Food insecurity - inability: Not on file  . Transportation needs - medical: Not on file  . Transportation needs - non-medical: Not on file  Occupational History  . Not on file  Tobacco Use  . Smoking status: Current Every Day Smoker    Packs/day: 0.50    Years: 40.00    Pack years: 20.00    Types: Cigarettes    Start date: 12/03/1976  . Smokeless tobacco: Never Used  Substance and Sexual Activity  . Alcohol use: Yes    Alcohol/week: 0.0 oz    Comment: occasionally  . Drug use: No  . Sexual activity: No  Other Topics Concern  . Not on file  Social History Narrative  . Not on file     Current Outpatient Medications:  .  acetaminophen (TYLENOL) 325 MG tablet, Take 2 tablets (650 mg total) every 6 (six) hours as needed by mouth for mild pain (or Fever >/= 101)., Disp: , Rfl:  .  albuterol (PROVENTIL) (2.5 MG/3ML) 0.083% nebulizer solution, Take 3 mLs (2.5 mg total) every 2 (two) hours as needed by nebulization for wheezing., Disp: 75 mL, Rfl: 12 .  bisacodyl (DULCOLAX) 5 MG EC tablet, Take 1 tablet (5 mg total) daily as needed by mouth for moderate constipation., Disp: 30 tablet, Rfl: 0 .  Calcium Carb-Cholecalciferol (CALCIUM + D3) 600-200 MG-UNIT TABS, Take by mouth., Disp: , Rfl:  .  cephALEXin (KEFLEX) 500 MG capsule, Take 1 capsule (500 mg total) every 12 (twelve) hours by mouth., Disp: , Rfl:  .  cyanocobalamin 500 MCG tablet, Take 500 mcg by mouth daily., Disp: , Rfl:  .  cyclobenzaprine (FLEXERIL) 10 MG tablet, Take 1 tablet (10 mg total) by mouth 3 (three) times daily as needed for muscle spasms., Disp: 30 tablet, Rfl: 0 .  DULoxetine (CYMBALTA) 60 MG capsule, Take 1 capsule (60 mg total) by mouth daily., Disp: 90 capsule, Rfl:  1 .  enoxaparin (LOVENOX) 40 MG/0.4ML injection, Inject 0.4 mLs (40 mg total) daily into the skin., Disp: 0 Syringe, Rfl:  .  feeding supplement, ENSURE ENLIVE, (ENSURE ENLIVE) LIQD, Take 237 mLs 3 (three) times daily between meals by mouth., Disp: 237 mL, Rfl: 12 .  folic acid (FOLVITE) 1 MG tablet, Take 1 mg by mouth daily., Disp: , Rfl: 1 .  hydrALAZINE (APRESOLINE) 20 MG/ML injection, Inject 0.5 mLs (10 mg total) every 6 (six) hours as needed into the vein (SBP>180 or DBP>100)., Disp: 1 mL, Rfl:  .  HYDROcodone-acetaminophen (NORCO/VICODIN) 5-325 MG tablet, Take 1-2 tablets every 4 (four) hours as needed by mouth for moderate pain., Disp: 30 tablet, Rfl: 0 .  insulin aspart (NOVOLOG) 100  UNIT/ML injection, Inject 0-5 Units at bedtime into the skin., Disp: 10 mL, Rfl: 11 .  insulin aspart (NOVOLOG) 100 UNIT/ML injection, Inject 0-9 Units 3 (three) times daily with meals into the skin., Disp: 10 mL, Rfl: 11 .  levothyroxine (SYNTHROID, LEVOTHROID) 50 MCG tablet, TAKE 1 TABLET BY MOUTH  DAILY BEFORE BREAKFAST, Disp: 90 tablet, Rfl: 1 .  Magnesium 400 MG CAPS, Take 1 capsule by mouth daily., Disp: 30 capsule, Rfl: 0 .  metoprolol tartrate (LOPRESSOR) 5 MG/5ML SOLN injection, Inject 5 mLs (5 mg total) every 4 (four) hours as needed into the vein (SBP greater than 160 heart rate greater than 100)., Disp: 15 mL, Rfl:  .  Multiple Vitamin (MULTIVITAMIN WITH MINERALS) TABS tablet, Take 1 tablet daily by mouth., Disp: , Rfl:  .  ondansetron (ZOFRAN) 4 MG tablet, Take 1 tablet (4 mg total) every 6 (six) hours as needed by mouth for nausea., Disp: 20 tablet, Rfl: 0 .  ondansetron (ZOFRAN) 4 MG/2ML SOLN injection, Inject 2 mLs (4 mg total) every 6 (six) hours as needed into the vein for nausea., Disp: 2 mL, Rfl: 0 .  senna-docusate (SENOKOT-S) 8.6-50 MG tablet, Take 1 tablet at bedtime as needed by mouth for mild constipation., Disp: , Rfl:   Allergies  Allergen Reactions  . Penicillins Rash  . Sulfa  Antibiotics Rash     ROS  Constitutional: Negative for fever or weight change.  Respiratory: Negative for cough and shortness of breath.   Cardiovascular: Negative for chest pain or palpitations.  Gastrointestinal: Negative for abdominal pain, no bowel changes.  Musculoskeletal: Negative for gait problem or joint swelling.  Skin: Negative for rash.  Neurological: See HPI No other specific complaints in a complete review of systems (except as listed in HPI above).  Objective  Vitals:   05/09/17 0829  BP: (!) 142/80  Pulse: 92  Resp: 16  Temp: 98.5 F (36.9 C)  TempSrc: Oral  SpO2: 96%  Weight: 108 lb (49 kg)  Height: 5' 2"  (1.575 m)   Body mass index is 19.75 kg/m.  Physical Exam  Constitutional: Patient appears well-developed and well-nourished. No distress.  HENT: Head: Normocephalic and atraumatic. Eyes: Conjunctivae are normal. Pupils are equal, round, and reactive to light. No scleral icterus.  Neck: Normal range of motion. Neck supple. No JVD present. No thyromegaly present.  Cardiovascular: Normal rate, regular rhythm and normal heart sounds.  No murmur heard. No BLE edema. Pulmonary/Chest: Effort normal and breath sounds normal. No respiratory distress. Musculoskeletal: Normal range of motion, no joint effusions. No gross deformities Neurological: she is alert and oriented to person, place, and time. BUE ataxia noted, mild BLE ataxia, patient leaning slightly to the right while sitting down.  End nystagmus present on occular movement to the far right. Strength is +5 in all extremities, face symmetric.  Speech is smooth with patient needing to pause periodically to think of the correct word to express herself.  Gait is unbalanced, but steadied with walker - no other assistance is needed. Skin: Skin is warm and dry. No rash noted. No erythema.  Psychiatric: Patient has a normal mood and affect. behavior is normal. Judgment and thought content normal.  Recent Results  (from the past 2160 hour(s))  Vitamin B12     Status: None   Collection Time: 02/11/17  8:07 AM  Result Value Ref Range   Vitamin B-12 856 200 - 1,100 pg/mL  VITAMIN D 25 Hydroxy (Vit-D Deficiency, Fractures)     Status:  Abnormal   Collection Time: 02/11/17  8:07 AM  Result Value Ref Range   Vit D, 25-Hydroxy 28 (L) 30 - 100 ng/mL    Comment: Vitamin D Status           25-OH Vitamin D        Deficiency                <20 ng/mL        Insufficiency         20 - 29 ng/mL        Optimal             > or = 30 ng/mL   For 25-OH Vitamin D testing on patients on D2-supplementation and patients for whom quantitation of D2 and D3 fractions is required, the QuestAssureD 25-OH VIT D, (D2,D3), LC/MS/MS is recommended: order code [id number] (patients > 2 yrs).   TSH     Status: None   Collection Time: 02/11/17  8:07 AM  Result Value Ref Range   TSH 1.38 mIU/L    Comment:   Reference Range   > or = 20 Years  0.40-4.50   Pregnancy Range First trimester  0.26-2.66 Second trimester 0.55-2.73 Third trimester  0.43-2.91     COMPLETE METABOLIC PANEL WITH GFR     Status: None   Collection Time: 02/11/17  8:07 AM  Result Value Ref Range   Sodium 137 135 - 146 mmol/L   Potassium 4.8 3.5 - 5.3 mmol/L   Chloride 102 98 - 110 mmol/L   CO2 28 20 - 32 mmol/L    Comment: ** Please note change in reference range(s). **      Glucose, Bld 85 65 - 99 mg/dL   BUN 9 7 - 25 mg/dL   Creat 0.61 0.50 - 0.99 mg/dL    Comment:   For patients > or = 69 years of age: The upper reference limit for Creatinine is approximately 13% higher for people identified as African-American.      Total Bilirubin 0.9 0.2 - 1.2 mg/dL   Alkaline Phosphatase 65 33 - 130 U/L   AST 11 10 - 35 U/L   ALT 10 6 - 29 U/L   Total Protein 6.3 6.1 - 8.1 g/dL   Albumin 4.1 3.6 - 5.1 g/dL   Calcium 9.2 8.6 - 10.4 mg/dL   GFR, Est African American >89 >=60 mL/min   GFR, Est Non African American >89 >=60 mL/min  CBC with  Differential/Platelet     Status: Abnormal   Collection Time: 02/11/17  8:07 AM  Result Value Ref Range   WBC 13.6 (H) 3.8 - 10.8 K/uL   RBC 4.08 3.80 - 5.10 MIL/uL   Hemoglobin 13.3 11.7 - 15.5 g/dL   HCT 39.1 35.0 - 45.0 %   MCV 95.8 80.0 - 100.0 fL   MCH 32.6 27.0 - 33.0 pg   MCHC 34.0 32.0 - 36.0 g/dL   RDW 14.0 11.0 - 15.0 %   Platelets 356 140 - 400 K/uL   MPV 9.6 7.5 - 12.5 fL   Neutro Abs 11,696 (H) 1,500 - 7,800 cells/uL   Lymphs Abs 1,224 850 - 3,900 cells/uL   Monocytes Absolute 408 200 - 950 cells/uL   Eosinophils Absolute 272 15 - 500 cells/uL   Basophils Absolute 0 0 - 200 cells/uL   Neutrophils Relative % 86 %   Lymphocytes Relative 9 %   Monocytes Relative 3 %   Eosinophils Relative 2 %  Basophils Relative 0 %   Smear Review Criteria for review not met   Lipid panel     Status: Abnormal   Collection Time: 02/11/17  8:07 AM  Result Value Ref Range   Cholesterol 165 <200 mg/dL   Triglycerides 114 <150 mg/dL   HDL 31 (L) >50 mg/dL   Total CHOL/HDL Ratio 5.3 (H) <5.0 Ratio   VLDL 23 <30 mg/dL   LDL Cholesterol 111 (H) <100 mg/dL  Sedimentation rate     Status: None   Collection Time: 02/15/17  1:48 PM  Result Value Ref Range   Sed Rate 20 0 - 30 mm/hr  C-reactive protein     Status: None   Collection Time: 02/15/17  1:48 PM  Result Value Ref Range   CRP 2.8 <8.0 mg/L  Urine Culture     Status: None   Collection Time: 02/15/17  1:56 PM  Result Value Ref Range   Culture ESCHERICHIA COLI     Comment: SOURCE: URINE&URINE   Colony Count 10,000-50,000 CFU/mL    Organism ID, Bacteria ESCHERICHIA COLI       Susceptibility   Escherichia coli -  (no method available)    AMPICILLIN  Resistant     AMOX/CLAVULANIC 4 Sensitive     AMPICILLIN/SULBACTAM 4 Sensitive     PIP/TAZO <=4 Sensitive     IMIPENEM <=0.25 Sensitive     CEFAZOLIN <=4 Not Reportable     CEFTRIAXONE <=1 Sensitive     CEFTAZIDIME <=1 Sensitive     CEFEPIME <=1 Sensitive     GENTAMICIN <=1  Sensitive     TOBRAMYCIN <=1 Sensitive     CIPROFLOXACIN <=0.25 Sensitive     LEVOFLOXACIN <=0.12 Sensitive     NITROFURANTOIN <=16 Sensitive     TRIMETH/SULFA* <=20 Sensitive      * NR=NOT REPORTABLE,SEE COMMENTORAL therapy:A cefazolin MIC of <32 predicts susceptibility to the oral agents cefaclor,cefdinir,cefpodoxime,cefprozil,cefuroxime,cephalexin,and loracarbef when used for therapy of uncomplicated UTIs due to E.coli,K.pneumomiae,and P.mirabilis. PARENTERAL therapy: A cefazolinMIC of >8 indicates resistance to parenteralcefazolin. An alternate test method must beperformed to confirm susceptibility to parenteralcefazolin.  CBC     Status: None   Collection Time: 02/18/17 11:23 AM  Result Value Ref Range   WBC 8.3 3.6 - 11.0 K/uL   RBC 3.99 3.80 - 5.20 MIL/uL   Hemoglobin 13.0 12.0 - 16.0 g/dL   HCT 37.6 35.0 - 47.0 %   MCV 94.1 80.0 - 100.0 fL   MCH 32.5 26.0 - 34.0 pg   MCHC 34.6 32.0 - 36.0 g/dL   RDW 14.0 11.5 - 14.5 %   Platelets 365 150 - 440 K/uL  Creatinine, serum     Status: None   Collection Time: 02/18/17 11:23 AM  Result Value Ref Range   Creatinine, Ser 0.58 0.44 - 1.00 mg/dL   GFR calc non Af Amer >60 >60 mL/min   GFR calc Af Amer >60 >60 mL/min    Comment: (NOTE) The eGFR has been calculated using the CKD EPI equation. This calculation has not been validated in all clinical situations. eGFR's persistently <60 mL/min signify possible Chronic Kidney Disease.   Cologuard     Status: None   Collection Time: 03/03/17 12:00 AM  Result Value Ref Range   Cologuard Positive     Comment: [employer]  BLADDER SCAN AMB NON-IMAGING     Status: None   Collection Time: 03/04/17  2:57 PM  Result Value Ref Range   Scan Result 51   POCT  Glucose (CBG)     Status: Abnormal   Collection Time: 03/05/17  9:47 AM  Result Value Ref Range   POC Glucose 127 (A) 70 - 99 mg/dl    Comment: post prandial  Urine Culture     Status: None   Collection Time: 03/05/17 10:47 AM  Result  Value Ref Range   MICRO NUMBER: [id number]    SPECIMEN QUALITY: ADEQUATE    Sample Source URINE, CLEAN CATCH    STATUS: FINAL    Result: No Growth   Urine Culture     Status: None   Collection Time: 03/25/17  1:57 PM  Result Value Ref Range   MICRO NUMBER: [id number]    SPECIMEN QUALITY: ADEQUATE    Sample Source URINE, CLEAN CATCH    STATUS: FINAL    Result: No Growth   Basic metabolic panel     Status: Abnormal   Collection Time: 04/28/17  1:49 PM  Result Value Ref Range   Sodium 133 (L) 135 - 145 mmol/L   Potassium 3.9 3.5 - 5.1 mmol/L   Chloride 95 (L) 101 - 111 mmol/L   CO2 26 22 - 32 mmol/L   Glucose, Bld 119 (H) 65 - 99 mg/dL   BUN 12 6 - 20 mg/dL   Creatinine, Ser 0.78 0.44 - 1.00 mg/dL   Calcium 9.6 8.9 - 10.3 mg/dL   GFR calc non Af Amer >60 >60 mL/min   GFR calc Af Amer >60 >60 mL/min    Comment: (NOTE) The eGFR has been calculated using the CKD EPI equation. This calculation has not been validated in all clinical situations. eGFR's persistently <60 mL/min signify possible Chronic Kidney Disease.    Anion gap 12 5 - 15  CBC     Status: None   Collection Time: 04/28/17  1:49 PM  Result Value Ref Range   WBC 9.6 3.6 - 11.0 K/uL   RBC 4.40 3.80 - 5.20 MIL/uL   Hemoglobin 14.2 12.0 - 16.0 g/dL   HCT 42.0 35.0 - 47.0 %   MCV 95.5 80.0 - 100.0 fL   MCH 32.3 26.0 - 34.0 pg   MCHC 33.8 32.0 - 36.0 g/dL   RDW 14.2 11.5 - 14.5 %   Platelets 398 150 - 440 K/uL  Urinalysis, Complete w Microscopic     Status: Abnormal   Collection Time: 04/28/17  1:57 PM  Result Value Ref Range   Color, Urine YELLOW (A) YELLOW   APPearance CLOUDY (A) CLEAR   Specific Gravity, Urine 1.009 1.005 - 1.030   pH 8.0 5.0 - 8.0   Glucose, UA NEGATIVE NEGATIVE mg/dL   Hgb urine dipstick NEGATIVE NEGATIVE   Bilirubin Urine NEGATIVE NEGATIVE   Ketones, ur NEGATIVE NEGATIVE mg/dL   Protein, ur NEGATIVE NEGATIVE mg/dL   Nitrite NEGATIVE NEGATIVE   Leukocytes, UA MODERATE (A) NEGATIVE   RBC /  HPF 0-5 0 - 5 RBC/hpf   WBC, UA 6-30 0 - 5 WBC/hpf   Bacteria, UA RARE (A) NONE SEEN   Squamous Epithelial / LPF 0-5 (A) NONE SEEN   Mucus PRESENT    Amorphous Crystal PRESENT    Non Squamous Epithelial 0-5 (A) NONE SEEN  Urine Culture     Status: Abnormal   Collection Time: 04/28/17  4:21 PM  Result Value Ref Range   Specimen Description URINE, RANDOM    Special Requests Normal    Culture >=100,000 COLONIES/mL KLEBSIELLA PNEUMONIAE (A)    Report Status 05/01/2017 FINAL  Organism ID, Bacteria KLEBSIELLA PNEUMONIAE (A)       Susceptibility   Klebsiella pneumoniae - MIC*    AMPICILLIN >=32 RESISTANT Resistant     CEFAZOLIN <=4 SENSITIVE Sensitive     CEFTRIAXONE <=1 SENSITIVE Sensitive     CIPROFLOXACIN <=0.25 SENSITIVE Sensitive     GENTAMICIN <=1 SENSITIVE Sensitive     IMIPENEM <=0.25 SENSITIVE Sensitive     NITROFURANTOIN 128 RESISTANT Resistant     TRIMETH/SULFA <=20 SENSITIVE Sensitive     AMPICILLIN/SULBACTAM 4 SENSITIVE Sensitive     PIP/TAZO <=4 SENSITIVE Sensitive     Extended ESBL NEGATIVE Sensitive     * >=100,000 COLONIES/mL KLEBSIELLA PNEUMONIAE  Glucose, capillary     Status: Abnormal   Collection Time: 04/28/17 10:42 PM  Result Value Ref Range   Glucose-Capillary 139 (H) 65 - 99 mg/dL   Comment 1 Notify RN   Basic metabolic panel     Status: Abnormal   Collection Time: 04/29/17  4:50 AM  Result Value Ref Range   Sodium 133 (L) 135 - 145 mmol/L   Potassium 3.6 3.5 - 5.1 mmol/L   Chloride 99 (L) 101 - 111 mmol/L   CO2 26 22 - 32 mmol/L   Glucose, Bld 119 (H) 65 - 99 mg/dL   BUN 12 6 - 20 mg/dL   Creatinine, Ser 0.53 0.44 - 1.00 mg/dL   Calcium 8.3 (L) 8.9 - 10.3 mg/dL   GFR calc non Af Amer >60 >60 mL/min   GFR calc Af Amer >60 >60 mL/min    Comment: (NOTE) The eGFR has been calculated using the CKD EPI equation. This calculation has not been validated in all clinical situations. eGFR's persistently <60 mL/min signify possible Chronic Kidney Disease.     Anion gap 8 5 - 15  CBC     Status: None   Collection Time: 04/29/17  4:50 AM  Result Value Ref Range   WBC 9.8 3.6 - 11.0 K/uL   RBC 3.96 3.80 - 5.20 MIL/uL   Hemoglobin 13.1 12.0 - 16.0 g/dL   HCT 38.1 35.0 - 47.0 %   MCV 96.1 80.0 - 100.0 fL   MCH 33.2 26.0 - 34.0 pg   MCHC 34.5 32.0 - 36.0 g/dL   RDW 14.2 11.5 - 14.5 %   Platelets 361 150 - 440 K/uL  Glucose, capillary     Status: Abnormal   Collection Time: 04/29/17  7:46 AM  Result Value Ref Range   Glucose-Capillary 117 (H) 65 - 99 mg/dL  Glucose, capillary     Status: Abnormal   Collection Time: 04/29/17 11:49 AM  Result Value Ref Range   Glucose-Capillary 121 (H) 65 - 99 mg/dL  Glucose, capillary     Status: Abnormal   Collection Time: 04/29/17  4:43 PM  Result Value Ref Range   Glucose-Capillary 109 (H) 65 - 99 mg/dL  Glucose, capillary     Status: Abnormal   Collection Time: 04/29/17  8:34 PM  Result Value Ref Range   Glucose-Capillary 156 (H) 65 - 99 mg/dL  CBC with Differential     Status: Abnormal   Collection Time: 04/30/17  4:36 AM  Result Value Ref Range   WBC 12.4 (H) 3.6 - 11.0 K/uL   RBC 3.92 3.80 - 5.20 MIL/uL   Hemoglobin 13.0 12.0 - 16.0 g/dL   HCT 37.7 35.0 - 47.0 %   MCV 96.0 80.0 - 100.0 fL   MCH 33.1 26.0 - 34.0 pg   MCHC 34.4  32.0 - 36.0 g/dL   RDW 14.2 11.5 - 14.5 %   Platelets 339 150 - 440 K/uL   Neutrophils Relative % 83 %   Neutro Abs 10.2 (H) 1.4 - 6.5 K/uL   Lymphocytes Relative 10 %   Lymphs Abs 1.2 1.0 - 3.6 K/uL   Monocytes Relative 6 %   Monocytes Absolute 0.8 0.2 - 0.9 K/uL   Eosinophils Relative 1 %   Eosinophils Absolute 0.2 0 - 0.7 K/uL   Basophils Relative 0 %   Basophils Absolute 0.1 0 - 0.1 K/uL  HIV antibody     Status: None   Collection Time: 04/30/17  4:36 AM  Result Value Ref Range   HIV Screen 4th Generation wRfx Non Reactive Non Reactive    Comment: (NOTE) Performed At: [facility] [place], [place] [id number] Rush Farmer MD  [id number]   Hepatitis B surface antigen     Status: None   Collection Time: 04/30/17  4:36 AM  Result Value Ref Range   Hepatitis B Surface Ag Negative Negative    Comment: (NOTE) Performed At: [facility] [place], [place] [id number] Rush Farmer MD EH:[id number]   Lactate dehydrogenase     Status: None   Collection Time: 04/30/17  4:36 AM  Result Value Ref Range   LDH 120 98 - 192 U/L  Glucose, capillary     Status: Abnormal   Collection Time: 04/30/17  7:50 AM  Result Value Ref Range   Glucose-Capillary 124 (H) 65 - 99 mg/dL  Glucose, capillary     Status: Abnormal   Collection Time: 04/30/17 12:03 PM  Result Value Ref Range   Glucose-Capillary 114 (H) 65 - 99 mg/dL  Glucose, capillary     Status: Abnormal   Collection Time: 04/30/17  5:06 PM  Result Value Ref Range   Glucose-Capillary 128 (H) 65 - 99 mg/dL  Glucose, capillary     Status: Abnormal   Collection Time: 04/30/17  8:21 PM  Result Value Ref Range   Glucose-Capillary 159 (H) 65 - 99 mg/dL  Glucose, capillary     Status: Abnormal   Collection Time: 05/01/17  7:45 AM  Result Value Ref Range   Glucose-Capillary 132 (H) 65 - 99 mg/dL  Glucose, capillary     Status: Abnormal   Collection Time: 05/01/17 12:00 PM  Result Value Ref Range   Glucose-Capillary 117 (H) 65 - 99 mg/dL  CULTURE, BLOOD (ROUTINE X 2) w Reflex to ID Panel     Status: None   Collection Time: 05/01/17  3:49 PM  Result Value Ref Range   Specimen Description BLOOD LEFT HAND    Special Requests      BOTTLES DRAWN AEROBIC AND ANAEROBIC Blood Culture results may not be optimal due to an excessive volume of blood received in culture bottles   Culture NO GROWTH 5 DAYS    Report Status 05/06/2017 FINAL   CULTURE, BLOOD (ROUTINE X 2) w Reflex to ID Panel     Status: None   Collection Time: 05/01/17  3:57 PM  Result Value Ref Range   Specimen Description BLOOD RAC    Special Requests      BOTTLES DRAWN AEROBIC AND  ANAEROBIC Blood Culture adequate volume   Culture NO GROWTH 5 DAYS    Report Status 05/06/2017 FINAL   Urine Culture     Status: Abnormal   Collection Time: 05/01/17  4:39 PM  Result Value Ref Range   Specimen  Description URINE, RANDOM    Special Requests NONE    Culture (A)     <10,000 COLONIES/mL INSIGNIFICANT GROWTH Performed at [facility], [place] [address], [place], [place] [address]    Report Status 05/03/2017 FINAL   Glucose, capillary     Status: Abnormal   Collection Time: 05/01/17  4:40 PM  Result Value Ref Range   Glucose-Capillary 125 (H) 65 - 99 mg/dL  Glucose, capillary     Status: Abnormal   Collection Time: 05/01/17  9:22 PM  Result Value Ref Range   Glucose-Capillary 128 (H) 65 - 99 mg/dL  Glucose, capillary     Status: Abnormal   Collection Time: 05/02/17  7:56 AM  Result Value Ref Range   Glucose-Capillary 147 (H) 65 - 99 mg/dL  Glucose, capillary     Status: Abnormal   Collection Time: 05/02/17 11:59 AM  Result Value Ref Range   Glucose-Capillary 132 (H) 65 - 99 mg/dL  Glucose, capillary     Status: Abnormal   Collection Time: 05/02/17  4:47 PM  Result Value Ref Range   Glucose-Capillary 152 (H) 65 - 99 mg/dL  Glucose, capillary     Status: Abnormal   Collection Time: 05/02/17  9:08 PM  Result Value Ref Range   Glucose-Capillary 117 (H) 65 - 99 mg/dL   Comment 1 Notify RN    Comment 2 Document in Chart   Creatinine, serum     Status: None   Collection Time: 05/03/17  7:05 AM  Result Value Ref Range   Creatinine, Ser 0.53 0.44 - 1.00 mg/dL   GFR calc non Af Amer >60 >60 mL/min   GFR calc Af Amer >60 >60 mL/min    Comment: (NOTE) The eGFR has been calculated using the CKD EPI equation. This calculation has not been validated in all clinical situations. eGFR's persistently <60 mL/min signify possible Chronic Kidney Disease.   CBC     Status: None   Collection Time: 05/03/17  7:05 AM  Result Value Ref Range   WBC 9.6 3.6 - 11.0 K/uL   RBC  3.97 3.80 - 5.20 MIL/uL   Hemoglobin 13.0 12.0 - 16.0 g/dL   HCT 37.8 35.0 - 47.0 %   MCV 95.3 80.0 - 100.0 fL   MCH 32.8 26.0 - 34.0 pg   MCHC 34.4 32.0 - 36.0 g/dL   RDW 14.0 11.5 - 14.5 %   Platelets 344 150 - 440 K/uL  Glucose, capillary     Status: Abnormal   Collection Time: 05/03/17  7:54 AM  Result Value Ref Range   Glucose-Capillary 116 (H) 65 - 99 mg/dL  Glucose, capillary     Status: Abnormal   Collection Time: 05/03/17 11:36 AM  Result Value Ref Range   Glucose-Capillary 142 (H) 65 - 99 mg/dL  Glucose, capillary     Status: Abnormal   Collection Time: 05/03/17  5:03 PM  Result Value Ref Range   Glucose-Capillary 120 (H) 65 - 99 mg/dL  Glucose, capillary     Status: Abnormal   Collection Time: 05/03/17  7:49 PM  Result Value Ref Range   Glucose-Capillary 120 (H) 65 - 99 mg/dL   Comment 1 Notify RN    PHQ2/9: Depression screen [facility] 2/9 04/25/2017 03/25/2017 02/15/2017 02/11/2017 12/03/2016  Decreased Interest 1 3 1 1  0  Down, Depressed, Hopeless 2 3 1 1  0  PHQ - 2 Score 3 6 2 2  0  Altered sleeping 2 3 0 0 -  Tired, decreased energy 2 3 1 1  -  Change in appetite 1 3 1 1  -  Feeling bad or failure about yourself  3 0 0 0 -  Trouble concentrating 2 3 1 1  -  Moving slowly or fidgety/restless 3 0 0 0 -  Suicidal thoughts 2 0 0 0 -  PHQ-9 Score 18 18 5 5  -  Difficult doing work/chores Not difficult at all Extremely dIfficult Somewhat difficult Somewhat difficult -   Fall Risk: Fall Risk  04/25/2017 03/25/2017 02/15/2017 02/11/2017 12/03/2016  Falls in the past year? Yes No Yes Yes Yes  Comment - - - - -  Number falls in past yr: 2 or more - 2 or more 2 or more 1  Injury with Fall? 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 - Patient and family will consider home health vs SNF placement for 24/7 care as patient proceeds with treatment. 2. Gait disturbance -  Continue use with walker, home health orders to be placed after contacting [facility] to discuss care and appropriate orders - Rx for shower seat and raised toilet seat written and given to patient's Daughter Junie Panning. There are no diagnoses linked to this encounter. 3. Ataxia Continue use with walker, home health orders to be placed after contacting [facility] to discuss care and appropriate orders 4. Benign essential HTN - Advised if Blood Pressure is >140/90 or heart rate is >100bpm, please take 1/2 tablet (12.84m).  If still not improving, may take additional 1/2 tablet and call our office. - metoprolol succinate (TOPROL-XL) 25 MG 24 hr tablet; Take 0.5-1 tablets (12.5-25 mg total) daily by mouth. If Blood pressure >140/90 or heart rate >100bpm, take 1/2 tablet.  Dispense: 90 tablet; Refill: 0   This patient was seen in conjunction with Dr. SAncil Boozer PCP, who agrees with this plan of care.  I have reviewed this encounter including the documentation in this note and/or discussed this patient with the pJohney Maine FNP, NP-C. I am certifying that I agree with the content of this note as supervising physician.  KSteele Sizer MD [facility] 05/09/2017, 1:42 PM

## 2017-05-09 NOTE — Telephone Encounter (Signed)
Spoke to [place] and we went over [employer] paperwork together to fill out [place] to work on referral ASAP

## 2017-05-09 NOTE — Patient Instructions (Signed)
Metoprolol: If Blood Pressure is >140/90 or heart rate is >100bpm, please take 1/2 tablet (12.5mg ).  If still not improving, may take additional 1/2 tablet.  Keep all your follow up appointments with [facility].  We will contact home health care to set up additional resources and services.

## 2017-05-09 NOTE — Telephone Encounter (Signed)
Please put in referral

## 2017-05-10 ENCOUNTER — Telehealth: Payer: Self-pay

## 2017-05-10 NOTE — Telephone Encounter (Signed)
done

## 2017-05-10 NOTE — Telephone Encounter (Signed)
Copied from [place] [phone]. Topic: Inquiry >> May 10, 2017 10:41 AM Conception Chancy, NT wrote: Reason for CRM: pt was referred by the hospital for [facility] and Reather Converse is calling to get the verbal orders for 2x a week for 4 weeks.  Her call back is [phone]

## 2017-05-10 NOTE — Telephone Encounter (Signed)
Tish has already submitted referral

## 2017-05-15 DIAGNOSIS — G0401 Postinfectious acute disseminated encephalitis and encephalomyelitis (postinfectious ADEM): Secondary | ICD-10-CM | POA: Insufficient documentation

## 2017-05-15 DIAGNOSIS — R2689 Other abnormalities of gait and mobility: Secondary | ICD-10-CM | POA: Insufficient documentation

## 2017-05-22 ENCOUNTER — Telehealth: Payer: Self-pay

## 2017-05-22 NOTE — Telephone Encounter (Signed)
Please give verbal order.

## 2017-05-22 NOTE — Telephone Encounter (Signed)
Copied from [place] [phone]. Topic: General - Other >> May 22, 2017 10:12 AM Yvette Rack wrote: Reason for CRM: Leda Gauze from [facility] [phone] would like verbal orders  PT two times a week for four weeks and a medical social worker for evaluation

## 2017-05-23 ENCOUNTER — Encounter: Payer: Medicare Other | Admitting: Physical Therapy

## 2017-05-24 NOTE — Telephone Encounter (Signed)
Verbal order was given to Georgiann Mccoy from [facility], for patient to have PT two time a week for four week and a medical social worker for evaluation.

## 2017-05-28 ENCOUNTER — Encounter: Payer: Self-pay | Admitting: Family Medicine

## 2017-05-29 ENCOUNTER — Other Ambulatory Visit: Payer: Self-pay | Admitting: Family Medicine

## 2017-05-29 DIAGNOSIS — M797 Fibromyalgia: Secondary | ICD-10-CM

## 2017-05-29 DIAGNOSIS — F331 Major depressive disorder, recurrent, moderate: Secondary | ICD-10-CM

## 2017-05-31 ENCOUNTER — Telehealth: Payer: Self-pay | Admitting: Family Medicine

## 2017-05-31 NOTE — Telephone Encounter (Signed)
Copied from [place] [phone]. Topic: Quick Communication - See Telephone Encounter >> May 31, 2017  2:48 PM Hewitt Shorts wrote: CRM for notification. See Telephone encounter for: pt home health is needing a verbal order  for speech therapy  referral and ot 1 time 1 week 3 times 2 weeks and 2times 3 weeks   Best number for home health is [phone]  05/31/17.

## 2017-05-31 NOTE — Telephone Encounter (Signed)
Tiffany called at 5:02 pm and gave verbal order.

## 2017-06-02 ENCOUNTER — Encounter: Payer: Self-pay | Admitting: Family Medicine

## 2017-06-05 ENCOUNTER — Ambulatory Visit (INDEPENDENT_AMBULATORY_CARE_PROVIDER_SITE_OTHER): Payer: Medicare Other | Admitting: Family Medicine

## 2017-06-05 ENCOUNTER — Encounter: Payer: Self-pay | Admitting: Family Medicine

## 2017-06-05 VITALS — BP 120/64 | HR 71 | Temp 98.4°F | Resp 16 | Wt 110.5 lb

## 2017-06-05 DIAGNOSIS — R809 Proteinuria, unspecified: Secondary | ICD-10-CM | POA: Diagnosis not present

## 2017-06-05 DIAGNOSIS — F331 Major depressive disorder, recurrent, moderate: Secondary | ICD-10-CM | POA: Diagnosis not present

## 2017-06-05 DIAGNOSIS — G0401 Postinfectious acute disseminated encephalitis and encephalomyelitis (postinfectious ADEM): Secondary | ICD-10-CM

## 2017-06-05 DIAGNOSIS — E1129 Type 2 diabetes mellitus with other diabetic kidney complication: Secondary | ICD-10-CM | POA: Diagnosis not present

## 2017-06-05 DIAGNOSIS — I10 Essential (primary) hypertension: Secondary | ICD-10-CM | POA: Diagnosis not present

## 2017-06-05 LAB — POCT GLYCOSYLATED HEMOGLOBIN (HGB A1C): HEMOGLOBIN A1C: 5.9

## 2017-06-05 NOTE — Progress Notes (Signed)
Name: Renee Cowan   MRN: [id number]    DOB: [birth year]   Date:06/05/2017       Progress Note  Subjective  Chief Complaint  Chief Complaint  Patient presents with  . Follow-up    6 months    HPI   ADEM: admitted 04/28/2017 abnormal MRI brain, transferred to [facility] 05/04/2017 and discharged  11/13 , waiting for biopsy results, biopsy was negative 11/19 and re-admitted on 11/21 through the [facility] - recommended by Dr. Lucy Antigua ophthalmologist because of abnormal finding on the eye exam. She was found to have ADEM and was started on Prednisone. Her son lives in [place] and has been with her since Nov. She is feeling better, but still have PT/OT and has to use a walker, needs assistance preparing meals, bathing, medication management, keeping up with appointments. Also needs assistance with laundry and taking care of her house. She currently has [facility] as her home health agency but they don't provide long hour supervision. Will is going back home the end of December and she will need to have someone home with her.   HTN: her bp has improved since started on Nifedipine, but still goes up to 140's/100's. No chest pain or palpitation  DMII: fsbs has been well controlled even on prednisone, eye exam is up to date. No polyphagia, no polydipsia or polyuria  Depression: Feeling numb, but does not want to go down on the dose of medication at this time. She denies crying spells.    Patient Active Problem List   Diagnosis Date Noted  . 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 Imbalance 05/15/2017  . Ataxia   . Abnormal MRI of head   . GAD (generalized anxiety disorder) 04/25/2017  . Moderate recurrent major depression ([place]) 04/25/2017  . Type 2 diabetes mellitus with microalbuminuria, without long-term current use of insulin ([place]) 11/28/2015  . Benign essential HTN 05/26/2015  . Depression, major, in remission ([place]) 05/26/2015  . Dyslipidemia 05/26/2015  .  Fibromyalgia syndrome 05/26/2015  . Personal history of malignant melanoma of skin 05/26/2015  . Adult hypothyroidism 05/26/2015  . Osteopenia after menopause 05/26/2015  . Tobacco use 05/26/2015  . Vitamin D deficiency 05/26/2015  . Rheumatoid arthritis ([place]) 05/26/2015  . Acquired trigger finger 10/11/2007    Past Surgical History:  Procedure Laterality Date  . CATARACT EXTRACTION, BILATERAL Bilateral [id number] and [id number]  . CHOLECYSTECTOMY    . CYST REMOVAL TRUNK    . melaoma excesion    . trigger thumb release      Family History  Problem Relation Age of Onset  . Cancer Mother        cervical  . Tuberculosis Father   . Kidney cancer Neg Hx   . Bladder Cancer Neg Hx     Social History   Socioeconomic History  . Marital status: Married    Spouse name: Not on file  . Number of children: Not on file  . Years of education: Not on file  . Highest education level: Not on file  Social Needs  . Financial resource strain: Not on file  . Food insecurity - worry: Not on file  . Food insecurity - inability: Not on file  . Transportation needs - medical: Not on file  . Transportation needs - non-medical: Not on file  Occupational History  . Not on file  Tobacco Use  . Smoking status: Former Smoker    Packs/day: 0.50    Years: 40.00    Pack years:  20.00    Types: Cigarettes    Start date: 12/03/1976    Last attempt to quit: 05/09/2017    Years since quitting: 0.0  . Smokeless tobacco: Never Used  Substance and Sexual Activity  . Alcohol use: Yes    Alcohol/week: 0.0 oz    Comment: occasionally  . Drug use: No  . Sexual activity: No  Other Topics Concern  . Not on file  Social History Narrative  . Not on file     Current Outpatient Medications:  .  acetaminophen (TYLENOL) 325 MG tablet, Take 2 tablets (650 mg total) every 6 (six) hours as needed by mouth for mild pain (or Fever >/= 101)., Disp: , Rfl:  .  Calcium Carb-Cholecalciferol (CALCIUM + D3) 600-200  MG-UNIT TABS, Take by mouth., Disp: , Rfl:  .  cyanocobalamin 500 MCG tablet, Take 500 mcg by mouth daily., Disp: , Rfl:  .  DULoxetine (CYMBALTA) 60 MG capsule, Take 1 capsule (60 mg total) by mouth daily., Disp: 90 capsule, Rfl: 1 .  folic acid (FOLVITE) 1 MG tablet, Take 1 mg by mouth daily., Disp: , Rfl: 1 .  levothyroxine (SYNTHROID, LEVOTHROID) 50 MCG tablet, TAKE 1 TABLET BY MOUTH  DAILY BEFORE BREAKFAST, Disp: 90 tablet, Rfl: 1 .  lisinopril (PRINIVIL,ZESTRIL) 20 MG tablet, Take 20 mg by mouth daily., Disp: , Rfl:  .  MAGNESIUM PO, Take 1 tablet daily by mouth., Disp: , Rfl:  .  methotrexate (RHEUMATREX) 2.5 MG tablet, Take 20 mg by mouth once a week. Caution:Chemotherapy. Protect from light., Disp: , Rfl:  .  NIFEdipine (PROCARDIA XL/ADALAT-CC) 60 MG 24 hr tablet, Take 60 mg by mouth daily., Disp: , Rfl:  .  pantoprazole (PROTONIX) 40 MG tablet, Take 1 tablet daily by mouth., Disp: , Rfl:  .  polyethylene glycol (MIRALAX / GLYCOLAX) packet, Take 1 packet daily as needed by mouth., Disp: , Rfl:  .  predniSONE (DELTASONE) 10 MG tablet, Take 10 mg by mouth daily., Disp: , Rfl:   Allergies  Allergen Reactions  . Penicillins Rash  . Sulfa Antibiotics Rash     ROS  Constitutional: Negative for fever positive for  weight change.  Respiratory: Negative for cough and shortness of breath.   Cardiovascular: Negative for chest pain or palpitations.  Gastrointestinal: Negative for abdominal pain, no bowel changes.  Musculoskeletal: Positive  for gait problem but no  joint swelling.  Skin: Negative for rash.  Neurological: Positive for dizziness no  headache.  No other specific complaints in a complete review of systems (except as listed in HPI above).  Objective  Vitals:   06/05/17 0958  BP: 120/64  Pulse: 71  Resp: 16  Temp: 98.4 F (36.9 C)  TempSrc: Oral  SpO2: 97%  Weight: 110 lb 8 oz (50.1 kg)    Body mass index is 20.21 kg/m.  Physical Exam  Constitutional: Patient  appears well-developed and well-nourished.  No distress.  HEENT: head atraumatic, normocephalic, pupils equal and reactive to light, neck supple, throat within normal limits Cardiovascular: Normal rate, regular rhythm and normal heart sounds.  No murmur heard. No BLE edema. Pulmonary/Chest: Effort normal and breath sounds normal. No respiratory distress. Abdominal: Soft.  There is no tenderness. Psychiatric: Patient has a normal mood and affect. behavior is normal. Judgment and thought content normal. Neurological: needs to use a walker, still having balance problems and also diplopia.   Recent Results (from the past 2160 hour(s))  Urine Culture     Status: None  Collection Time: 03/25/17  1:57 PM  Result Value Ref Range   MICRO NUMBER: [id number]    SPECIMEN QUALITY: ADEQUATE    Sample Source URINE, CLEAN CATCH    STATUS: FINAL    Result: No Growth   Basic metabolic panel     Status: Abnormal   Collection Time: 04/28/17  1:49 PM  Result Value Ref Range   Sodium 133 (L) 135 - 145 mmol/L   Potassium 3.9 3.5 - 5.1 mmol/L   Chloride 95 (L) 101 - 111 mmol/L   CO2 26 22 - 32 mmol/L   Glucose, Bld 119 (H) 65 - 99 mg/dL   BUN 12 6 - 20 mg/dL   Creatinine, Ser 0.78 0.44 - 1.00 mg/dL   Calcium 9.6 8.9 - 10.3 mg/dL   GFR calc non Af Amer >60 >60 mL/min   GFR calc Af Amer >60 >60 mL/min    Comment: (NOTE) The eGFR has been calculated using the CKD EPI equation. This calculation has not been validated in all clinical situations. eGFR's persistently <60 mL/min signify possible Chronic Kidney Disease.    Anion gap 12 5 - 15  CBC     Status: None   Collection Time: 04/28/17  1:49 PM  Result Value Ref Range   WBC 9.6 3.6 - 11.0 K/uL   RBC 4.40 3.80 - 5.20 MIL/uL   Hemoglobin 14.2 12.0 - 16.0 g/dL   HCT 42.0 35.0 - 47.0 %   MCV 95.5 80.0 - 100.0 fL   MCH 32.3 26.0 - 34.0 pg   MCHC 33.8 32.0 - 36.0 g/dL   RDW 14.2 11.5 - 14.5 %   Platelets 398 150 - 440 K/uL  Urinalysis, Complete w  Microscopic     Status: Abnormal   Collection Time: 04/28/17  1:57 PM  Result Value Ref Range   Color, Urine YELLOW (A) YELLOW   APPearance CLOUDY (A) CLEAR   Specific Gravity, Urine 1.009 1.005 - 1.030   pH 8.0 5.0 - 8.0   Glucose, UA NEGATIVE NEGATIVE mg/dL   Hgb urine dipstick NEGATIVE NEGATIVE   Bilirubin Urine NEGATIVE NEGATIVE   Ketones, ur NEGATIVE NEGATIVE mg/dL   Protein, ur NEGATIVE NEGATIVE mg/dL   Nitrite NEGATIVE NEGATIVE   Leukocytes, UA MODERATE (A) NEGATIVE   RBC / HPF 0-5 0 - 5 RBC/hpf   WBC, UA 6-30 0 - 5 WBC/hpf   Bacteria, UA RARE (A) NONE SEEN   Squamous Epithelial / LPF 0-5 (A) NONE SEEN   Mucus PRESENT    Amorphous Crystal PRESENT    Non Squamous Epithelial 0-5 (A) NONE SEEN  Urine Culture     Status: Abnormal   Collection Time: 04/28/17  4:21 PM  Result Value Ref Range   Specimen Description URINE, RANDOM    Special Requests Normal    Culture >=100,000 COLONIES/mL KLEBSIELLA PNEUMONIAE (A)    Report Status 05/01/2017 FINAL    Organism ID, Bacteria KLEBSIELLA PNEUMONIAE (A)       Susceptibility   Klebsiella pneumoniae - MIC*    AMPICILLIN >=32 RESISTANT Resistant     CEFAZOLIN <=4 SENSITIVE Sensitive     CEFTRIAXONE <=1 SENSITIVE Sensitive     CIPROFLOXACIN <=0.25 SENSITIVE Sensitive     GENTAMICIN <=1 SENSITIVE Sensitive     IMIPENEM <=0.25 SENSITIVE Sensitive     NITROFURANTOIN 128 RESISTANT Resistant     TRIMETH/SULFA <=20 SENSITIVE Sensitive     AMPICILLIN/SULBACTAM 4 SENSITIVE Sensitive     PIP/TAZO <=4 SENSITIVE Sensitive  Extended ESBL NEGATIVE Sensitive     * >=100,000 COLONIES/mL KLEBSIELLA PNEUMONIAE  Glucose, capillary     Status: Abnormal   Collection Time: 04/28/17 10:42 PM  Result Value Ref Range   Glucose-Capillary 139 (H) 65 - 99 mg/dL   Comment 1 Notify RN   Basic metabolic panel     Status: Abnormal   Collection Time: 04/29/17  4:50 AM  Result Value Ref Range   Sodium 133 (L) 135 - 145 mmol/L   Potassium 3.6 3.5 - 5.1  mmol/L   Chloride 99 (L) 101 - 111 mmol/L   CO2 26 22 - 32 mmol/L   Glucose, Bld 119 (H) 65 - 99 mg/dL   BUN 12 6 - 20 mg/dL   Creatinine, Ser 0.53 0.44 - 1.00 mg/dL   Calcium 8.3 (L) 8.9 - 10.3 mg/dL   GFR calc non Af Amer >60 >60 mL/min   GFR calc Af Amer >60 >60 mL/min    Comment: (NOTE) The eGFR has been calculated using the CKD EPI equation. This calculation has not been validated in all clinical situations. eGFR's persistently <60 mL/min signify possible Chronic Kidney Disease.    Anion gap 8 5 - 15  CBC     Status: None   Collection Time: 04/29/17  4:50 AM  Result Value Ref Range   WBC 9.8 3.6 - 11.0 K/uL   RBC 3.96 3.80 - 5.20 MIL/uL   Hemoglobin 13.1 12.0 - 16.0 g/dL   HCT 38.1 35.0 - 47.0 %   MCV 96.1 80.0 - 100.0 fL   MCH 33.2 26.0 - 34.0 pg   MCHC 34.5 32.0 - 36.0 g/dL   RDW 14.2 11.5 - 14.5 %   Platelets 361 150 - 440 K/uL  Glucose, capillary     Status: Abnormal   Collection Time: 04/29/17  7:46 AM  Result Value Ref Range   Glucose-Capillary 117 (H) 65 - 99 mg/dL  Glucose, capillary     Status: Abnormal   Collection Time: 04/29/17 11:49 AM  Result Value Ref Range   Glucose-Capillary 121 (H) 65 - 99 mg/dL  Glucose, capillary     Status: Abnormal   Collection Time: 04/29/17  4:43 PM  Result Value Ref Range   Glucose-Capillary 109 (H) 65 - 99 mg/dL  Glucose, capillary     Status: Abnormal   Collection Time: 04/29/17  8:34 PM  Result Value Ref Range   Glucose-Capillary 156 (H) 65 - 99 mg/dL  CBC with Differential     Status: Abnormal   Collection Time: 04/30/17  4:36 AM  Result Value Ref Range   WBC 12.4 (H) 3.6 - 11.0 K/uL   RBC 3.92 3.80 - 5.20 MIL/uL   Hemoglobin 13.0 12.0 - 16.0 g/dL   HCT 37.7 35.0 - 47.0 %   MCV 96.0 80.0 - 100.0 fL   MCH 33.1 26.0 - 34.0 pg   MCHC 34.4 32.0 - 36.0 g/dL   RDW 14.2 11.5 - 14.5 %   Platelets 339 150 - 440 K/uL   Neutrophils Relative % 83 %   Neutro Abs 10.2 (H) 1.4 - 6.5 K/uL   Lymphocytes Relative 10 %    Lymphs Abs 1.2 1.0 - 3.6 K/uL   Monocytes Relative 6 %   Monocytes Absolute 0.8 0.2 - 0.9 K/uL   Eosinophils Relative 1 %   Eosinophils Absolute 0.2 0 - 0.7 K/uL   Basophils Relative 0 %   Basophils Absolute 0.1 0 - 0.1 K/uL  HIV antibody  Status: None   Collection Time: 04/30/17  4:36 AM  Result Value Ref Range   HIV Screen 4th Generation wRfx Non Reactive Non Reactive    Comment: (NOTE) Performed At: [facility] [place], [place] [id number] Rush Farmer MD [id number]   Hepatitis B surface antigen     Status: None   Collection Time: 04/30/17  4:36 AM  Result Value Ref Range   Hepatitis B Surface Ag Negative Negative    Comment: (NOTE) Performed At: [facility] [place], [place] [id number] Rush Farmer MD [id number]   Lactate dehydrogenase     Status: None   Collection Time: 04/30/17  4:36 AM  Result Value Ref Range   LDH 120 98 - 192 U/L  Glucose, capillary     Status: Abnormal   Collection Time: 04/30/17  7:50 AM  Result Value Ref Range   Glucose-Capillary 124 (H) 65 - 99 mg/dL  Glucose, capillary     Status: Abnormal   Collection Time: 04/30/17 12:03 PM  Result Value Ref Range   Glucose-Capillary 114 (H) 65 - 99 mg/dL  Glucose, capillary     Status: Abnormal   Collection Time: 04/30/17  5:06 PM  Result Value Ref Range   Glucose-Capillary 128 (H) 65 - 99 mg/dL  Glucose, capillary     Status: Abnormal   Collection Time: 04/30/17  8:21 PM  Result Value Ref Range   Glucose-Capillary 159 (H) 65 - 99 mg/dL  Glucose, capillary     Status: Abnormal   Collection Time: 05/01/17  7:45 AM  Result Value Ref Range   Glucose-Capillary 132 (H) 65 - 99 mg/dL  Glucose, capillary     Status: Abnormal   Collection Time: 05/01/17 12:00 PM  Result Value Ref Range   Glucose-Capillary 117 (H) 65 - 99 mg/dL  CULTURE, BLOOD (ROUTINE X 2) w Reflex to ID Panel     Status: None   Collection Time: 05/01/17  3:49 PM  Result Value  Ref Range   Specimen Description BLOOD LEFT HAND    Special Requests      BOTTLES DRAWN AEROBIC AND ANAEROBIC Blood Culture results may not be optimal due to an excessive volume of blood received in culture bottles   Culture NO GROWTH 5 DAYS    Report Status 05/06/2017 FINAL   CULTURE, BLOOD (ROUTINE X 2) w Reflex to ID Panel     Status: None   Collection Time: 05/01/17  3:57 PM  Result Value Ref Range   Specimen Description BLOOD RAC    Special Requests      BOTTLES DRAWN AEROBIC AND ANAEROBIC Blood Culture adequate volume   Culture NO GROWTH 5 DAYS    Report Status 05/06/2017 FINAL   Urine Culture     Status: Abnormal   Collection Time: 05/01/17  4:39 PM  Result Value Ref Range   Specimen Description URINE, RANDOM    Special Requests NONE    Culture (A)     <10,000 COLONIES/mL INSIGNIFICANT GROWTH Performed at [facility], [address], [place], [place] [address]    Report Status 05/03/2017 FINAL   Glucose, capillary     Status: Abnormal   Collection Time: 05/01/17  4:40 PM  Result Value Ref Range   Glucose-Capillary 125 (H) 65 - 99 mg/dL  Glucose, capillary     Status: Abnormal   Collection Time: 05/01/17  9:22 PM  Result Value Ref Range   Glucose-Capillary 128 (H) 65 - 99 mg/dL  Glucose, capillary     Status: Abnormal   Collection Time: 05/02/17  7:56 AM  Result Value Ref Range   Glucose-Capillary 147 (H) 65 - 99 mg/dL  Glucose, capillary     Status: Abnormal   Collection Time: 05/02/17 11:59 AM  Result Value Ref Range   Glucose-Capillary 132 (H) 65 - 99 mg/dL  Glucose, capillary     Status: Abnormal   Collection Time: 05/02/17  4:47 PM  Result Value Ref Range   Glucose-Capillary 152 (H) 65 - 99 mg/dL  Glucose, capillary     Status: Abnormal   Collection Time: 05/02/17  9:08 PM  Result Value Ref Range   Glucose-Capillary 117 (H) 65 - 99 mg/dL   Comment 1 Notify RN    Comment 2 Document in Chart   Creatinine, serum     Status: None   Collection Time:  05/03/17  7:05 AM  Result Value Ref Range   Creatinine, Ser 0.53 0.44 - 1.00 mg/dL   GFR calc non Af Amer >60 >60 mL/min   GFR calc Af Amer >60 >60 mL/min    Comment: (NOTE) The eGFR has been calculated using the CKD EPI equation. This calculation has not been validated in all clinical situations. eGFR's persistently <60 mL/min signify possible Chronic Kidney Disease.   CBC     Status: None   Collection Time: 05/03/17  7:05 AM  Result Value Ref Range   WBC 9.6 3.6 - 11.0 K/uL   RBC 3.97 3.80 - 5.20 MIL/uL   Hemoglobin 13.0 12.0 - 16.0 g/dL   HCT 37.8 35.0 - 47.0 %   MCV 95.3 80.0 - 100.0 fL   MCH 32.8 26.0 - 34.0 pg   MCHC 34.4 32.0 - 36.0 g/dL   RDW 14.0 11.5 - 14.5 %   Platelets 344 150 - 440 K/uL  Glucose, capillary     Status: Abnormal   Collection Time: 05/03/17  7:54 AM  Result Value Ref Range   Glucose-Capillary 116 (H) 65 - 99 mg/dL  Glucose, capillary     Status: Abnormal   Collection Time: 05/03/17 11:36 AM  Result Value Ref Range   Glucose-Capillary 142 (H) 65 - 99 mg/dL  Glucose, capillary     Status: Abnormal   Collection Time: 05/03/17  5:03 PM  Result Value Ref Range   Glucose-Capillary 120 (H) 65 - 99 mg/dL  Glucose, capillary     Status: Abnormal   Collection Time: 05/03/17  7:49 PM  Result Value Ref Range   Glucose-Capillary 120 (H) 65 - 99 mg/dL   Comment 1 Notify RN      PHQ2/9: Depression screen [facility] 2/9 06/05/2017 04/25/2017 03/25/2017 02/15/2017 02/11/2017  Decreased Interest 0 1 3 1 1   Down, Depressed, Hopeless 0 2 3 1 1   PHQ - 2 Score 0 3 6 2 2   Altered sleeping - 2 3 0 0  Tired, decreased energy - 2 3 1 1   Change in appetite - 1 3 1 1   Feeling bad or failure about yourself  - 3 0 0 0  Trouble concentrating - 2 3 1 1   Moving slowly or fidgety/restless - 3 0 0 0  Suicidal thoughts - 2 0 0 0  PHQ-9 Score - 18 18 5 5   Difficult doing work/chores - Not difficult at all Extremely dIfficult Somewhat difficult Somewhat difficult     Fall  Risk: Fall Risk  06/05/2017 04/25/2017 03/25/2017 02/15/2017 02/11/2017  Falls in the past year? Yes Yes No Yes  Yes  Comment - - - - -  Number falls in past yr: 2 or more 2 or more - 2 or more 2 or more  Injury with Fall? No Yes - No No  Risk for fall due to : - - - - -  Follow up - - - - -     Functional Status Survey: Is the patient deaf or have difficulty hearing?: No Does the patient have difficulty seeing, even when wearing glasses/contacts?: No Does the patient have difficulty concentrating, remembering, or making decisions?: No Does the patient have difficulty walking or climbing stairs?: Yes Does the patient have difficulty dressing or bathing?: No Does the patient have difficulty doing errands alone such as visiting a doctor's office or shopping?: Yes  She still has double vision   Assessment & Plan  1. Benign essential HTN  Occasionally spiking at home, advised to repeat bp check if bp above 140/90  2. Moderate recurrent major depression ([place])  Doing better, taking Cymbalta.   3. Type 2 diabetes mellitus with microalbuminuria, without long-term current use of insulin ([facility])  - POCT HgB A1C  4. ADEM (acute disseminated encephalomyelitis) (postinfectious)  Going to [facility] and taking Prednisone.

## 2017-06-06 ENCOUNTER — Encounter: Payer: Medicare Other | Admitting: Physical Therapy

## 2017-06-06 ENCOUNTER — Other Ambulatory Visit: Payer: Self-pay | Admitting: Family Medicine

## 2017-06-06 ENCOUNTER — Encounter: Payer: Self-pay | Admitting: Family Medicine

## 2017-06-06 DIAGNOSIS — E039 Hypothyroidism, unspecified: Secondary | ICD-10-CM

## 2017-06-06 MED ORDER — LEVOTHYROXINE SODIUM 50 MCG PO TABS: ORAL_TABLET | ORAL | 1 refills | Status: DC

## 2017-06-11 ENCOUNTER — Telehealth: Payer: Self-pay

## 2017-06-11 ENCOUNTER — Encounter: Payer: Self-pay | Admitting: Family Medicine

## 2017-06-11 NOTE — Telephone Encounter (Signed)
-----   Message from Steele Sizer, MD sent at 06/11/2017  1:25 PM EST ----- Can you please contact her son ( Will) he is staying with her, maybe the long term care insurance can pick up the bill.  ----- Message ----- From: Dennard Schaumann, CMA Sent: 06/11/2017  11:04 AM To: Steele Sizer, MD  I got a fax back from [facility] stating they do not accept faxed referrals, so I called them and was informed that with this patient's insurance she will have to pay out of pocket and it is $20/hr.  If this patient is interested then she will have to call them so that they can go over everything with her.  Kristeen Miss

## 2017-06-11 NOTE — Telephone Encounter (Signed)
Per the request of Dr. Ancil Boozer, I contacted this patient's son to inform him of the referral status and the progress we have made so far. I then gave him the option of [facility] and told him that whatever organization he prefers we will make sure that it is taken care of.  He said ok and thanks.

## 2017-06-13 ENCOUNTER — Other Ambulatory Visit: Payer: Self-pay

## 2017-06-13 MED ORDER — NIFEDIPINE ER OSMOTIC RELEASE 60 MG PO TB24: 60.0000 mg | ORAL_TABLET | Freq: Every day | ORAL | 2 refills | Status: DC

## 2017-06-13 MED ORDER — LISINOPRIL 20 MG PO TABS: 20.0000 mg | ORAL_TABLET | Freq: Every day | ORAL | 2 refills | Status: DC

## 2017-06-13 NOTE — Telephone Encounter (Signed)
Refill request for general medication: Lisinopril, Folic Acid and Nifedipine [facility].   Last office visit:06/05/2017

## 2017-06-20 ENCOUNTER — Encounter: Payer: Medicare Other | Admitting: Physical Therapy

## 2017-06-24 ENCOUNTER — Other Ambulatory Visit: Payer: Self-pay

## 2017-06-24 ENCOUNTER — Encounter: Payer: Self-pay | Admitting: Family Medicine

## 2017-06-24 NOTE — Telephone Encounter (Signed)
Refill request for general medication: Folic Acid and Methotrexate  Last office visit: 06/05/2017  Last physical exam: 02/11/2017  Follow up visit:  07/18/2017

## 2017-06-26 ENCOUNTER — Telehealth: Payer: Self-pay | Admitting: Family Medicine

## 2017-06-26 ENCOUNTER — Other Ambulatory Visit: Payer: Self-pay

## 2017-06-26 ENCOUNTER — Other Ambulatory Visit: Payer: Self-pay | Admitting: Family Medicine

## 2017-06-26 DIAGNOSIS — E039 Hypothyroidism, unspecified: Secondary | ICD-10-CM

## 2017-06-26 DIAGNOSIS — H532 Diplopia: Secondary | ICD-10-CM | POA: Insufficient documentation

## 2017-06-26 DIAGNOSIS — M797 Fibromyalgia: Secondary | ICD-10-CM

## 2017-06-26 DIAGNOSIS — F331 Major depressive disorder, recurrent, moderate: Secondary | ICD-10-CM

## 2017-06-26 MED ORDER — DULOXETINE HCL 60 MG PO CPEP: 60.0000 mg | ORAL_CAPSULE | Freq: Every day | ORAL | 5 refills | Status: AC

## 2017-06-26 MED ORDER — ACETAMINOPHEN 325 MG PO TABS: 650.0000 mg | ORAL_TABLET | Freq: Four times a day (QID) | ORAL | 2 refills | Status: AC | PRN

## 2017-06-26 MED ORDER — VITAMIN D 1000 UNITS PO TABS: 1000.0000 [IU] | ORAL_TABLET | Freq: Every day | ORAL | 5 refills | Status: DC

## 2017-06-26 MED ORDER — CYANOCOBALAMIN 500 MCG PO TABS: 500.0000 ug | ORAL_TABLET | Freq: Every day | ORAL | 5 refills | Status: AC

## 2017-06-26 MED ORDER — MAGNESIUM 250 MG PO TABS: 1.0000 | ORAL_TABLET | Freq: Every day | ORAL | 5 refills | Status: AC

## 2017-06-26 MED ORDER — LEVOTHYROXINE SODIUM 50 MCG PO TABS: ORAL_TABLET | ORAL | 1 refills | Status: AC

## 2017-06-26 NOTE — Telephone Encounter (Signed)
Dr. Olegario Messier needs to send Folic acid and Metothrexate, please tee up the otc medications

## 2017-06-26 NOTE — Telephone Encounter (Signed)
Pharmacy calling and needing written prescriptions for over the counter meds, Vit B12 1000, Vit D and multivitamin.   Also need refills on Folic Acid 1mg  and Methotrexate 2.5 mg  [facility] [place] Ph[facility] [phone] Fax# [phone]

## 2017-06-26 NOTE — Telephone Encounter (Signed)
Copied from [place] [id number]. Topic: Inquiry >> Jun 24, 2017  1:16 PM Cecelia Byars, NT wrote: Reason for CRM: Patients son called and [facility] [address] in [place] , please call [phone]

## 2017-06-28 ENCOUNTER — Encounter: Payer: Self-pay | Admitting: Family Medicine

## 2017-07-03 ENCOUNTER — Telehealth: Payer: Self-pay | Admitting: Family Medicine

## 2017-07-03 ENCOUNTER — Other Ambulatory Visit: Payer: Self-pay | Admitting: Family Medicine

## 2017-07-03 ENCOUNTER — Encounter: Payer: Self-pay | Admitting: Family Medicine

## 2017-07-03 NOTE — Telephone Encounter (Signed)
Copied from [place] [phone]. Topic: Quick Communication - See Telephone Encounter >> Jul 03, 2017  4:09 PM Vernona Rieger wrote: CRM for notification. See Telephone encounter for:   07/03/17.  Colletta [place] for [employer] needs verbal orders for occupational therapy for 2 times a week for 8 week  Call back [phone]

## 2017-07-03 NOTE — Telephone Encounter (Signed)
Yes.  Please give verbal orders.

## 2017-07-03 NOTE — Telephone Encounter (Signed)
Pharmacy calling again, have not heard anything, requesting call back. Need dose clarification on VIT D 3, [phone]

## 2017-07-03 NOTE — Telephone Encounter (Signed)
Please call pharmacy, I sent vitamin D 1000 mcg daily on 06/26/2017

## 2017-07-04 ENCOUNTER — Ambulatory Visit: Payer: Self-pay

## 2017-07-04 NOTE — Telephone Encounter (Signed)
Gave verbal orders over [employer] for Occupational Therapy at [phone].

## 2017-07-04 NOTE — Telephone Encounter (Signed)
Clarified with the pharmacy and Dr. Ancil Boozer per the patient conversation with the doctor. Dr. Ancil Boozer recommends just taking the Magnesium 250 mg daily and Vitamin D 1000 units daily. Patient does not need the combination calcium/magnesium/zinc. Spoke with pharmacist and they will call the patient to clarified these over the counter medications as well.

## 2017-07-04 NOTE — Telephone Encounter (Signed)
Patient's son called in reporting patient fell and hit her face. I asked if the patient could talk to give me consent to talk to the son, she gave me verbal consent. The patient said she was standing up from the toilet to wipe herself and fell forward hitting her left cheek on the tub. I asked the son what does her face look like and he said it is bruised and swollen larger than 2 inches. He said she has ice on it. I asked her was she in pain, she denies. I asked her did she feel faint when she fell forward, she said "no." The son got back on the phone and I asked is she alert and oriented, he said "yes she is."  The only complaint she had was her teeth are sore from the fall. According to the protocol, see PCP within 24 hours, appointment made for tomorrow, care advice given to the son, the son also advised to watch for mental status changes, such as confusion, unable to arouse, watch for weakness on either side of her body, he verbalized understanding.    Reason for Disposition . Large swelling or bruise > 2 inches (5 cm)  Answer Assessment - Initial Assessment Questions 1. MECHANISM: "How did the injury happen?"      Fell forward when standing to wipe myself after using the bathroom 2. ONSET: "When did the injury happen?" (Minutes or hours ago)     1615 3. LOCATION: "What part of the face is injured?"     Left cheek  4. APPEARANCE of INJURY: "What does the face look like?"     Left cheek bone is swollen, bruised 5. BLEEDING: "Is it bleeding now?" If so, ask, "Is it difficult to stop?"     No 6. PAIN: "Is there pain?" If so, ask: "How bad is the pain?"  (e.g., Scale 1-10; or mild, moderate, severe)     No 7. SIZE: For cuts, bruises, or swelling, ask: "How large is it?" (e.g., inches or centimeters)      2 inches swollen 8. TETANUS: For any breaks in the skin, ask: "When was the last tetanus booster?"     N/A 9. OTHER SYMPTOMS: "Do you have any other symptoms?" (e.g., neck pain, headache,  loss of consciousness)     Teeth sore 10. PREGNANCY: "Is there any chance you are pregnant?" "When was your last menstrual period?"       No  Protocols used: FACE INJURY-A-AH

## 2017-07-05 ENCOUNTER — Ambulatory Visit
Admission: RE | Admit: 2017-07-05 | Discharge: 2017-07-05 | Disposition: A | Discharge status: Home / Self Care | Payer: Medicare Other | Source: Ambulatory Visit | Attending: Family Medicine | Admitting: Family Medicine

## 2017-07-05 ENCOUNTER — Ambulatory Visit: Payer: Medicare Other | Admitting: Family Medicine

## 2017-07-05 ENCOUNTER — Encounter: Payer: Self-pay | Admitting: Family Medicine

## 2017-07-05 VITALS — BP 118/76 | HR 81 | Temp 98.7°F | Resp 18 | Ht 60.0 in | Wt 115.4 lb

## 2017-07-05 DIAGNOSIS — E1129 Type 2 diabetes mellitus with other diabetic kidney complication: Secondary | ICD-10-CM

## 2017-07-05 DIAGNOSIS — S0993XA Unspecified injury of face, initial encounter: Secondary | ICD-10-CM

## 2017-07-05 DIAGNOSIS — Z9181 History of falling: Secondary | ICD-10-CM

## 2017-07-05 DIAGNOSIS — H5712 Ocular pain, left eye: Secondary | ICD-10-CM | POA: Insufficient documentation

## 2017-07-05 DIAGNOSIS — W19XXXA Unspecified fall, initial encounter: Secondary | ICD-10-CM | POA: Insufficient documentation

## 2017-07-05 DIAGNOSIS — F331 Major depressive disorder, recurrent, moderate: Secondary | ICD-10-CM | POA: Diagnosis not present

## 2017-07-05 DIAGNOSIS — R809 Proteinuria, unspecified: Secondary | ICD-10-CM

## 2017-07-05 DIAGNOSIS — H5710 Ocular pain, unspecified eye: Secondary | ICD-10-CM | POA: Diagnosis present

## 2017-07-05 NOTE — Progress Notes (Signed)
Name: Renee Cowan   MRN: [id number]    DOB: [birth year]   Date:07/05/2017       Progress Note  Subjective  Chief Complaint  Chief Complaint  Patient presents with  . Fall    Patient had a fall while getting off the commode and hit her upper chest and left eye on the tub. Patient states she got dizzy and her son checked her BS right afterwards it was 220. She just losted her balance while pulling up her underwear and she is sore around her eye and gum area.     HPI  Recent fall: see above statement from fall. She still has a bruise on left zigomatic area, also swelling and pain to touch, she was wearing her glasses, but it did not break. Caregiver was standing by the door and witnessed the fall, son saw caregiver lift her from the floor. She is back to her baseline since. She has a lot of neuro deficits from ADEM but stable  DMII: she is on prednisone 25 mg daily and tapering for ADEM and glucose has been spiking lately, but highest level 220 post-prandially, she denies polyphagia, polydipsia or polyuria.   Diplopia: resolved , seen by ophthalmologist 09 days ago  Depression major: son is still living with her by going to [place] on Monday, she complains of feeling numb at times, but no other side effects of Cymbalta. We will hold off on decreasing dose for now. Denies suicidal thoughts or ideation   Patient Active Problem List   Diagnosis Date Noted  . Diplopia 06/26/2017  . 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 Imbalance 05/15/2017  . Ataxia   . Abnormal MRI of head   . GAD (generalized anxiety disorder) 04/25/2017  . Moderate recurrent major depression ([place]) 04/25/2017  . Type 2 diabetes mellitus with microalbuminuria, without long-term current use of insulin ([place]) 11/28/2015  . Benign essential HTN 05/26/2015  . Depression, major, in remission ([place]) 05/26/2015  . Dyslipidemia 05/26/2015  . Fibromyalgia syndrome 05/26/2015  . Personal history  of malignant melanoma of skin 05/26/2015  . Adult hypothyroidism 05/26/2015  . Osteopenia after menopause 05/26/2015  . Tobacco use 05/26/2015  . Vitamin D deficiency 05/26/2015  . Rheumatoid arthritis ([place]) 05/26/2015  . Acquired trigger finger 10/11/2007    Past Surgical History:  Procedure Laterality Date  . CATARACT EXTRACTION, BILATERAL Bilateral [id number] and [id number]/[id number]  . CHOLECYSTECTOMY    . CYST REMOVAL TRUNK    . melaoma excesion    . trigger thumb release      Family History  Problem Relation Age of Onset  . Cancer Mother        cervical  . Tuberculosis Father   . Kidney cancer Neg Hx   . Bladder Cancer Neg Hx     Social History   Socioeconomic History  . Marital status: Married    Spouse name: Not on file  . Number of children: Not on file  . Years of education: Not on file  . Highest education level: Not on file  Social Needs  . Financial resource strain: Not on file  . Food insecurity - worry: Not on file  . Food insecurity - inability: Not on file  . Transportation needs - medical: Not on file  . Transportation needs - non-medical: Not on file  Occupational History  . Not on file  Tobacco Use  . Smoking status: Former Smoker    Packs/day: 0.50    Years: 40.00  Pack years: 20.00    Types: Cigarettes    Start date: 12/03/1976    Last attempt to quit: 05/09/2017    Years since quitting: 0.1  . Smokeless tobacco: Never Used  Substance and Sexual Activity  . Alcohol use: Yes    Alcohol/week: 0.0 oz    Comment: occasionally  . Drug use: No  . Sexual activity: No  Other Topics Concern  . Not on file  Social History Narrative  . Not on file     Current Outpatient Medications:  .  cholecalciferol (VITAMIN D) 1000 units tablet, Take 1 tablet (1,000 Units total) by mouth daily., Disp: 30 tablet, Rfl: 5 .  cyanocobalamin 500 MCG tablet, Take 1 tablet (500 mcg total) by mouth daily., Disp: 30 tablet, Rfl: 5 .  cyclobenzaprine (FLEXERIL) 5 MG  tablet, Take 5 mg by mouth 3 (three) times daily as needed for muscle spasms., Disp: , Rfl:  .  DULoxetine (CYMBALTA) 60 MG capsule, Take 1 capsule (60 mg total) by mouth daily., Disp: 30 capsule, Rfl: 5 .  folic acid (FOLVITE) 1 MG tablet, Take 1 mg by mouth daily., Disp: , Rfl: 1 .  levothyroxine (SYNTHROID, LEVOTHROID) 50 MCG tablet, TAKE 1 TABLET BY MOUTH  DAILY BEFORE BREAKFAST, Disp: 90 tablet, Rfl: 1 .  lisinopril (PRINIVIL,ZESTRIL) 20 MG tablet, Take 1 tablet (20 mg total) by mouth daily., Disp: 30 tablet, Rfl: 2 .  Magnesium 250 MG TABS, Take 1 tablet (250 mg total) by mouth daily., Disp: 30 tablet, Rfl: 5 .  meloxicam (MOBIC) 15 MG tablet, Take 15 mg by mouth as needed for pain., Disp: , Rfl:  .  methotrexate (RHEUMATREX) 2.5 MG tablet, Take 20 mg by mouth once a week. Caution:Chemotherapy. Protect from light., Disp: , Rfl:  .  NIFEdipine (PROCARDIA XL/ADALAT-CC) 60 MG 24 hr tablet, Take 1 tablet (60 mg total) by mouth daily., Disp: 30 tablet, Rfl: 2 .  polyethylene glycol (MIRALAX / GLYCOLAX) packet, Take 17 g by mouth daily as needed., Disp: , Rfl:  .  predniSONE (DELTASONE) 10 MG tablet, Take 10 mg by mouth daily., Disp: , Rfl:  .  predniSONE (DELTASONE) 5 MG tablet, Take one tablet by mouth once daily for one week and every other week thereafter, Disp: , Rfl:  .  acetaminophen (TYLENOL) 325 MG tablet, Take 2 tablets (650 mg total) by mouth every 6 (six) hours as needed for mild pain (or Fever >/= 101). (Patient not taking: Reported on 07/05/2017), Disp: 30 tablet, Rfl: 2 .  Calcium Carb-Cholecalciferol (CALCIUM + D3) 600-200 MG-UNIT TABS, Take by mouth., Disp: , Rfl:   Allergies  Allergen Reactions  . Penicillins Rash  . Sulfa Antibiotics Rash     ROS  Ten systems reviewed and is negative except as mentioned in HPI   Objective  Vitals:   07/05/17 1157  BP: 118/76  Pulse: 81  Resp: 18  Temp: 98.7 F (37.1 C)  TempSrc: Oral  SpO2: 97%  Weight: 115 lb 6.4 oz (52.3 kg)   Height: 5' (1.524 m)    Body mass index is 22.54 kg/m.  Physical Exam  Constitutional: Patient appears well-developed and well-nourished. No distress.  HEENT: head atraumatic, normocephalic, pupils equal and reactive to light,  neck supple, throat within normal limits, pain , swelling and bruising of left zygomatic area, normal external ocular movements Cardiovascular: Normal rate, regular rhythm and normal heart sounds.  No murmur heard. No BLE edema. Pulmonary/Chest: Effort normal and breath sounds normal. No respiratory  distress. Abdominal: Soft.  There is no tenderness. Psychiatric: Patient has a normal mood and affect. Difficulty with focus and not oriented to time ( has difficulty with concept of time) Muscular skeletal: using a walker  Recent Results (from the past 2160 hour(s))  Basic metabolic panel     Status: Abnormal   Collection Time: 04/28/17  1:49 PM  Result Value Ref Range   Sodium 133 (L) 135 - 145 mmol/L   Potassium 3.9 3.5 - 5.1 mmol/L   Chloride 95 (L) 101 - 111 mmol/L   CO2 26 22 - 32 mmol/L   Glucose, Bld 119 (H) 65 - 99 mg/dL   BUN 12 6 - 20 mg/dL   Creatinine, Ser 0.78 0.44 - 1.00 mg/dL   Calcium 9.6 8.9 - 10.3 mg/dL   GFR calc non Af Amer >60 >60 mL/min   GFR calc Af Amer >60 >60 mL/min    Comment: (NOTE) The eGFR has been calculated using the CKD EPI equation. This calculation has not been validated in all clinical situations. eGFR's persistently <60 mL/min signify possible Chronic Kidney Disease.    Anion gap 12 5 - 15  CBC     Status: None   Collection Time: 04/28/17  1:49 PM  Result Value Ref Range   WBC 9.6 3.6 - 11.0 K/uL   RBC 4.40 3.80 - 5.20 MIL/uL   Hemoglobin 14.2 12.0 - 16.0 g/dL   HCT 42.0 35.0 - 47.0 %   MCV 95.5 80.0 - 100.0 fL   MCH 32.3 26.0 - 34.0 pg   MCHC 33.8 32.0 - 36.0 g/dL   RDW 14.2 11.5 - 14.5 %   Platelets 398 150 - 440 K/uL  Urinalysis, Complete w Microscopic     Status: Abnormal   Collection Time: 04/28/17  1:57  PM  Result Value Ref Range   Color, Urine YELLOW (A) YELLOW   APPearance CLOUDY (A) CLEAR   Specific Gravity, Urine 1.009 1.005 - 1.030   pH 8.0 5.0 - 8.0   Glucose, UA NEGATIVE NEGATIVE mg/dL   Hgb urine dipstick NEGATIVE NEGATIVE   Bilirubin Urine NEGATIVE NEGATIVE   Ketones, ur NEGATIVE NEGATIVE mg/dL   Protein, ur NEGATIVE NEGATIVE mg/dL   Nitrite NEGATIVE NEGATIVE   Leukocytes, UA MODERATE (A) NEGATIVE   RBC / HPF 0-5 0 - 5 RBC/hpf   WBC, UA 6-30 0 - 5 WBC/hpf   Bacteria, UA RARE (A) NONE SEEN   Squamous Epithelial / LPF 0-5 (A) NONE SEEN   Mucus PRESENT    Amorphous Crystal PRESENT    Non Squamous Epithelial 0-5 (A) NONE SEEN  Urine Culture     Status: Abnormal   Collection Time: 04/28/17  4:21 PM  Result Value Ref Range   Specimen Description URINE, RANDOM    Special Requests Normal    Culture >=100,000 COLONIES/mL KLEBSIELLA PNEUMONIAE (A)    Report Status 05/01/2017 FINAL    Organism ID, Bacteria KLEBSIELLA PNEUMONIAE (A)       Susceptibility   Klebsiella pneumoniae - MIC*    AMPICILLIN >=32 RESISTANT Resistant     CEFAZOLIN <=4 SENSITIVE Sensitive     CEFTRIAXONE <=1 SENSITIVE Sensitive     CIPROFLOXACIN <=0.25 SENSITIVE Sensitive     GENTAMICIN <=1 SENSITIVE Sensitive     IMIPENEM <=0.25 SENSITIVE Sensitive     NITROFURANTOIN 128 RESISTANT Resistant     TRIMETH/SULFA <=20 SENSITIVE Sensitive     AMPICILLIN/SULBACTAM 4 SENSITIVE Sensitive     PIP/TAZO <=4 SENSITIVE Sensitive  Extended ESBL NEGATIVE Sensitive     * >=100,000 COLONIES/mL KLEBSIELLA PNEUMONIAE  Glucose, capillary     Status: Abnormal   Collection Time: 04/28/17 10:42 PM  Result Value Ref Range   Glucose-Capillary 139 (H) 65 - 99 mg/dL   Comment 1 Notify RN   Basic metabolic panel     Status: Abnormal   Collection Time: 04/29/17  4:50 AM  Result Value Ref Range   Sodium 133 (L) 135 - 145 mmol/L   Potassium 3.6 3.5 - 5.1 mmol/L   Chloride 99 (L) 101 - 111 mmol/L   CO2 26 22 - 32 mmol/L    Glucose, Bld 119 (H) 65 - 99 mg/dL   BUN 12 6 - 20 mg/dL   Creatinine, Ser 0.53 0.44 - 1.00 mg/dL   Calcium 8.3 (L) 8.9 - 10.3 mg/dL   GFR calc non Af Amer >60 >60 mL/min   GFR calc Af Amer >60 >60 mL/min    Comment: (NOTE) The eGFR has been calculated using the CKD EPI equation. This calculation has not been validated in all clinical situations. eGFR's persistently <60 mL/min signify possible Chronic Kidney Disease.    Anion gap 8 5 - 15  CBC     Status: None   Collection Time: 04/29/17  4:50 AM  Result Value Ref Range   WBC 9.8 3.6 - 11.0 K/uL   RBC 3.96 3.80 - 5.20 MIL/uL   Hemoglobin 13.1 12.0 - 16.0 g/dL   HCT 38.1 35.0 - 47.0 %   MCV 96.1 80.0 - 100.0 fL   MCH 33.2 26.0 - 34.0 pg   MCHC 34.5 32.0 - 36.0 g/dL   RDW 14.2 11.5 - 14.5 %   Platelets 361 150 - 440 K/uL  Glucose, capillary     Status: Abnormal   Collection Time: 04/29/17  7:46 AM  Result Value Ref Range   Glucose-Capillary 117 (H) 65 - 99 mg/dL  Glucose, capillary     Status: Abnormal   Collection Time: 04/29/17 11:49 AM  Result Value Ref Range   Glucose-Capillary 121 (H) 65 - 99 mg/dL  Glucose, capillary     Status: Abnormal   Collection Time: 04/29/17  4:43 PM  Result Value Ref Range   Glucose-Capillary 109 (H) 65 - 99 mg/dL  Glucose, capillary     Status: Abnormal   Collection Time: 04/29/17  8:34 PM  Result Value Ref Range   Glucose-Capillary 156 (H) 65 - 99 mg/dL  CBC with Differential     Status: Abnormal   Collection Time: 04/30/17  4:36 AM  Result Value Ref Range   WBC 12.4 (H) 3.6 - 11.0 K/uL   RBC 3.92 3.80 - 5.20 MIL/uL   Hemoglobin 13.0 12.0 - 16.0 g/dL   HCT 37.7 35.0 - 47.0 %   MCV 96.0 80.0 - 100.0 fL   MCH 33.1 26.0 - 34.0 pg   MCHC 34.4 32.0 - 36.0 g/dL   RDW 14.2 11.5 - 14.5 %   Platelets 339 150 - 440 K/uL   Neutrophils Relative % 83 %   Neutro Abs 10.2 (H) 1.4 - 6.5 K/uL   Lymphocytes Relative 10 %   Lymphs Abs 1.2 1.0 - 3.6 K/uL   Monocytes Relative 6 %   Monocytes  Absolute 0.8 0.2 - 0.9 K/uL   Eosinophils Relative 1 %   Eosinophils Absolute 0.2 0 - 0.7 K/uL   Basophils Relative 0 %   Basophils Absolute 0.1 0 - 0.1 K/uL  HIV antibody  Status: None   Collection Time: 04/30/17  4:36 AM  Result Value Ref Range   HIV Screen 4th Generation wRfx Non Reactive Non Reactive    Comment: (NOTE) Performed At: [facility] [place], [place] [id number] Rush Farmer MD [id number]   Hepatitis B surface antigen     Status: None   Collection Time: 04/30/17  4:36 AM  Result Value Ref Range   Hepatitis B Surface Ag Negative Negative    Comment: (NOTE) Performed At: [facility] [place], [place] [id number] Rush Farmer MD [id number]   Lactate dehydrogenase     Status: None   Collection Time: 04/30/17  4:36 AM  Result Value Ref Range   LDH 120 98 - 192 U/L  Glucose, capillary     Status: Abnormal   Collection Time: 04/30/17  7:50 AM  Result Value Ref Range   Glucose-Capillary 124 (H) 65 - 99 mg/dL  Glucose, capillary     Status: Abnormal   Collection Time: 04/30/17 12:03 PM  Result Value Ref Range   Glucose-Capillary 114 (H) 65 - 99 mg/dL  Glucose, capillary     Status: Abnormal   Collection Time: 04/30/17  5:06 PM  Result Value Ref Range   Glucose-Capillary 128 (H) 65 - 99 mg/dL  Glucose, capillary     Status: Abnormal   Collection Time: 04/30/17  8:21 PM  Result Value Ref Range   Glucose-Capillary 159 (H) 65 - 99 mg/dL  Glucose, capillary     Status: Abnormal   Collection Time: 05/01/17  7:45 AM  Result Value Ref Range   Glucose-Capillary 132 (H) 65 - 99 mg/dL  Glucose, capillary     Status: Abnormal   Collection Time: 05/01/17 12:00 PM  Result Value Ref Range   Glucose-Capillary 117 (H) 65 - 99 mg/dL  CULTURE, BLOOD (ROUTINE X 2) w Reflex to ID Panel     Status: None   Collection Time: 05/01/17  3:49 PM  Result Value Ref Range   Specimen Description BLOOD LEFT HAND    Special Requests       BOTTLES DRAWN AEROBIC AND ANAEROBIC Blood Culture results may not be optimal due to an excessive volume of blood received in culture bottles   Culture NO GROWTH 5 DAYS    Report Status 05/06/2017 FINAL   CULTURE, BLOOD (ROUTINE X 2) w Reflex to ID Panel     Status: None   Collection Time: 05/01/17  3:57 PM  Result Value Ref Range   Specimen Description BLOOD RAC    Special Requests      BOTTLES DRAWN AEROBIC AND ANAEROBIC Blood Culture adequate volume   Culture NO GROWTH 5 DAYS    Report Status 05/06/2017 FINAL   Urine Culture     Status: Abnormal   Collection Time: 05/01/17  4:39 PM  Result Value Ref Range   Specimen Description URINE, RANDOM    Special Requests NONE    Culture (A)     <10,000 COLONIES/mL INSIGNIFICANT GROWTH Performed at [facility], [address], [place], [place] [address]    Report Status 05/03/2017 FINAL   Glucose, capillary     Status: Abnormal   Collection Time: 05/01/17  4:40 PM  Result Value Ref Range   Glucose-Capillary 125 (H) 65 - 99 mg/dL  Glucose, capillary     Status: Abnormal   Collection Time: 05/01/17  9:22 PM  Result Value Ref Range   Glucose-Capillary 128 (H) 65 - 99 mg/dL  Glucose, capillary     Status: Abnormal   Collection Time: 05/02/17  7:56 AM  Result Value Ref Range   Glucose-Capillary 147 (H) 65 - 99 mg/dL  Glucose, capillary     Status: Abnormal   Collection Time: 05/02/17 11:59 AM  Result Value Ref Range   Glucose-Capillary 132 (H) 65 - 99 mg/dL  Glucose, capillary     Status: Abnormal   Collection Time: 05/02/17  4:47 PM  Result Value Ref Range   Glucose-Capillary 152 (H) 65 - 99 mg/dL  Glucose, capillary     Status: Abnormal   Collection Time: 05/02/17  9:08 PM  Result Value Ref Range   Glucose-Capillary 117 (H) 65 - 99 mg/dL   Comment 1 Notify RN    Comment 2 Document in Chart   Creatinine, serum     Status: None   Collection Time: 05/03/17  7:05 AM  Result Value Ref Range   Creatinine, Ser 0.53 0.44 -  1.00 mg/dL   GFR calc non Af Amer >60 >60 mL/min   GFR calc Af Amer >60 >60 mL/min    Comment: (NOTE) The eGFR has been calculated using the CKD EPI equation. This calculation has not been validated in all clinical situations. eGFR's persistently <60 mL/min signify possible Chronic Kidney Disease.   CBC     Status: None   Collection Time: 05/03/17  7:05 AM  Result Value Ref Range   WBC 9.6 3.6 - 11.0 K/uL   RBC 3.97 3.80 - 5.20 MIL/uL   Hemoglobin 13.0 12.0 - 16.0 g/dL   HCT 37.8 35.0 - 47.0 %   MCV 95.3 80.0 - 100.0 fL   MCH 32.8 26.0 - 34.0 pg   MCHC 34.4 32.0 - 36.0 g/dL   RDW 14.0 11.5 - 14.5 %   Platelets 344 150 - 440 K/uL  Glucose, capillary     Status: Abnormal   Collection Time: 05/03/17  7:54 AM  Result Value Ref Range   Glucose-Capillary 116 (H) 65 - 99 mg/dL  Glucose, capillary     Status: Abnormal   Collection Time: 05/03/17 11:36 AM  Result Value Ref Range   Glucose-Capillary 142 (H) 65 - 99 mg/dL  Glucose, capillary     Status: Abnormal   Collection Time: 05/03/17  5:03 PM  Result Value Ref Range   Glucose-Capillary 120 (H) 65 - 99 mg/dL  Glucose, capillary     Status: Abnormal   Collection Time: 05/03/17  7:49 PM  Result Value Ref Range   Glucose-Capillary 120 (H) 65 - 99 mg/dL   Comment 1 Notify RN   POCT HgB A1C     Status: Abnormal   Collection Time: 06/05/17 11:03 AM  Result Value Ref Range   Hemoglobin A1C 5.9      PHQ2/9: Depression screen [facility] 2/9 06/05/2017 04/25/2017 03/25/2017 02/15/2017 02/11/2017  Decreased Interest 0 _0 Down, Depressed, Hopeless 0 _1 PHQ - 2 Score 0 _2 Altered sleeping - 2 3 0 0  Tired, decreased energy - _3 Change in appetite - _4 Feeling bad or failure about yourself  - 3 0 0 0  Trouble concentrating - _5 Moving slowly or fidgety/restless - 3 0 0 0  Suicidal thoughts - 2 0 0 0  PHQ-9 Score - _6 Difficult doing work/chores - Not difficult at all Extremely dIfficult Somewhat  difficult Somewhat difficult     Fall Risk: Fall Risk  06/05/2017 04/25/2017 03/25/2017 02/15/2017 02/11/2017  Falls in the past year? Yes Yes No Yes Yes  Comment - - - - -  Number falls in past yr: 2 or more 2 or more - 2 or more 2 or more  Injury with Fall? No Yes - No No  Risk for fall due to : - - - - -  Follow up - - - - -     Functional Status Survey:  Is the patient deaf or have difficulty hearing?: No Does the patient have difficulty seeing, even when wearing glasses/contacts?: No Does the patient have difficulty concentrating, remembering, or making decisions?: Yes Does the patient have difficulty walking or climbing stairs?: Yes Does the patient have difficulty dressing or bathing?: Yes Does the patient have difficulty doing errands alone such as visiting a doctor's office or shopping?: Yes    Assessment & Plan   1. History of recent fall  - DG Orbits; Future  2. Facial injury, initial encounter  - DG Orbits; Future  3. Moderate recurrent major depression ([place])  She is to continue Cymbalta   4. Type 2 diabetes mellitus with microalbuminuria, without long-term current use of insulin ([facility])  Monitor glucose at home , and call if glucose above 300

## 2017-07-08 ENCOUNTER — Encounter: Payer: Self-pay | Admitting: Family Medicine

## 2017-07-10 ENCOUNTER — Encounter: Payer: Self-pay | Admitting: Family Medicine

## 2017-07-10 ENCOUNTER — Telehealth: Payer: Self-pay | Admitting: Family Medicine

## 2017-07-10 NOTE — Telephone Encounter (Signed)
2nd fall in one week, she needs to go to [facility] and have CT brain, make sure not a stroke

## 2017-07-10 NOTE — Telephone Encounter (Signed)
Copied from [place] [id number]. Topic: Quick Communication - See Telephone Encounter >> Jul 10, 2017 11:30 AM Ahmed Prima L wrote: CRM for notification. See Telephone encounter for:   07/10/17.  [facility] wants Dr Ancil Boozer know she fell yesterday around 2pm, she has a bruise around her right eye. She does not remember how she fell or how long she was out. She was by herself when this happened. She did not seek treatment. SHe also missed her meds yesterday bc her package had not arrived. She took the individual meds this am  Call back is [phone]

## 2017-07-10 NOTE — Telephone Encounter (Signed)
Spoke with patient and she states she doesn't know how it happened and is fine. Wanted to let the patient know we are here for her and worried since this was the second fall in a week time span and encourage her to go to the ED and have a CT scan done to rule out a possible stroke. Patient declines going to ED or having a CT and states she is fine. Spoke with Renee Cowan, PT for [facility] and she states she has improved in PT but still has weakness and noticed the two bruises on both eyes and inquired about them. Renee Cowan, PT did a report and notified patient son Renee Cowan at [phone] about recent fall and declining to seek medical care. When Renee Cowan, PT asked patient what happened, Renee Cowan states she ate breakfast got dressed and when to her bedroom window. She does not remember how long she was out for and how she fell but she did wake up next to her window again. Renee Cowan, PT informed me she does have a call bell and camera in the house but not in the bedroom but patient did not hit the alarm when she fell or woke up.

## 2017-07-11 NOTE — Telephone Encounter (Signed)
Spoke with patient's son Will and agreed if we brought patient in-Dr. Ancil Boozer could encourage patient to have CT. We could have Imaging done Stat and done after visit to help due to patient unable to drive.

## 2017-07-11 NOTE — Telephone Encounter (Signed)
Please contact her son ( Will), he sent me a message asking about the CT. I can schedule if he can find a way to take her there.  Thank you

## 2017-07-12 ENCOUNTER — Ambulatory Visit (INDEPENDENT_AMBULATORY_CARE_PROVIDER_SITE_OTHER): Payer: Medicare Other | Admitting: Family Medicine

## 2017-07-12 ENCOUNTER — Ambulatory Visit
Admission: RE | Admit: 2017-07-12 | Discharge: 2017-07-12 | Disposition: A | Discharge status: Home / Self Care | Payer: Medicare Other | Source: Ambulatory Visit | Attending: Family Medicine | Admitting: Family Medicine

## 2017-07-12 ENCOUNTER — Encounter: Payer: Self-pay | Admitting: Family Medicine

## 2017-07-12 VITALS — BP 130/90 | HR 86 | Resp 14 | Ht 60.0 in | Wt 117.6 lb

## 2017-07-12 DIAGNOSIS — S0993XA Unspecified injury of face, initial encounter: Secondary | ICD-10-CM

## 2017-07-12 DIAGNOSIS — R269 Unspecified abnormalities of gait and mobility: Secondary | ICD-10-CM

## 2017-07-12 DIAGNOSIS — G939 Disorder of brain, unspecified: Secondary | ICD-10-CM | POA: Diagnosis not present

## 2017-07-12 DIAGNOSIS — G0401 Postinfectious acute disseminated encephalitis and encephalomyelitis (postinfectious ADEM): Secondary | ICD-10-CM

## 2017-07-12 DIAGNOSIS — R55 Syncope and collapse: Secondary | ICD-10-CM | POA: Insufficient documentation

## 2017-07-12 LAB — POCT I-STAT CREATININE: Creatinine, Ser: 0.8 mg/dL (ref 0.44–1.00)

## 2017-07-12 MED ORDER — GADOBENATE DIMEGLUMINE 529 MG/ML IV SOLN
10.0000 mL | Freq: Once | INTRAVENOUS | Status: AC | PRN
  Administered 2017-07-12: 10 mL via INTRAVENOUS

## 2017-07-12 NOTE — Progress Notes (Signed)
Name: Renee Cowan   MRN: [id number]    DOB: 1946/04/07   Date:07/12/2017       Progress Note  Subjective  Chief Complaint  Chief Complaint  Patient presents with  . Fall    HPI  She came in today with her friend Nori Riis) and also with caregiver Ivin Booty Mcbroom - from [place]).  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 She has been getting slightly better with prednisone, however over the past 2 weeks she has fallen twice. The first time it happened while using the bathroom ( witnessed fall) and she fracture part of left orbital bone. This second episode happened while at home ( alone) and in the kitchen. She does not recall what happened prior to the fall ( two days ago) or how long she was unconscious, she denies bowel or bladder incontinence, she has a bruise on right forehead. We discussed options, since she is stable we will try getting imaging and making decisions once results are available.   Patient Active Problem List   Diagnosis Date Noted  . Diplopia 06/26/2017  . 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 Imbalance 05/15/2017  . Ataxia   . Abnormal MRI of head   . GAD (generalized anxiety disorder) 04/25/2017  . Moderate recurrent major depression ([place]) 04/25/2017  . Type 2 diabetes mellitus with microalbuminuria, without long-term current use of insulin ([place]) 11/28/2015  . Benign essential HTN 05/26/2015  . Depression, major, in remission ([place]) 05/26/2015  . Dyslipidemia 05/26/2015  . Fibromyalgia syndrome 05/26/2015  . Personal history of malignant melanoma of skin 05/26/2015  . Adult hypothyroidism 05/26/2015  . Osteopenia after menopause 05/26/2015  . Tobacco use 05/26/2015  . Vitamin D deficiency 05/26/2015  . Rheumatoid arthritis ([place]) 05/26/2015  . Acquired trigger  finger 10/11/2007    Social History   Tobacco Use  . Smoking status: Former Smoker    Packs/day: 0.50    Years: 40.00    Pack years: 20.00    Types: Cigarettes    Start date: 12/03/1976    Last attempt to quit: 05/09/2017    Years since quitting: 0.1  . Smokeless tobacco: Never Used  Substance Use Topics  . Alcohol use: Yes    Alcohol/week: 0.0 oz    Comment: occasionally     Current Outpatient Medications:  .  acetaminophen (TYLENOL) 325 MG tablet, Take 2 tablets (650 mg total) by mouth every 6 (six) hours as needed for mild pain (or Fever >/= 101). (Patient not taking: Reported on 07/05/2017), Disp: 30 tablet, Rfl: 2 .  Calcium Carb-Cholecalciferol (CALCIUM + D3) 600-200 MG-UNIT TABS, Take by mouth., Disp: , Rfl:  .  cholecalciferol (VITAMIN D) 1000 units tablet, Take 1 tablet (1,000 Units total) by mouth daily., Disp: 30 tablet, Rfl: 5 .  cyanocobalamin 500 MCG tablet, Take 1 tablet (500 mcg total) by mouth daily., Disp: 30 tablet, Rfl: 5 .  cyclobenzaprine (FLEXERIL) 5 MG tablet, Take 5 mg by mouth 3 (three) times daily as needed for muscle spasms., Disp: , Rfl:  .  DULoxetine (CYMBALTA) 60 MG capsule, Take 1 capsule (60 mg total) by mouth daily., Disp: 30 capsule, Rfl: 5 .  folic acid (FOLVITE) 1 MG tablet, Take 1 mg by mouth daily., Disp: , Rfl: 1 .  levothyroxine (SYNTHROID,  LEVOTHROID) 50 MCG tablet, TAKE 1 TABLET BY MOUTH  DAILY BEFORE BREAKFAST, Disp: 90 tablet, Rfl: 1 .  lisinopril (PRINIVIL,ZESTRIL) 20 MG tablet, Take 1 tablet (20 mg total) by mouth daily., Disp: 30 tablet, Rfl: 2 .  Magnesium 250 MG TABS, Take 1 tablet (250 mg total) by mouth daily., Disp: 30 tablet, Rfl: 5 .  meloxicam (MOBIC) 15 MG tablet, Take 15 mg by mouth as needed for pain., Disp: , Rfl:  .  methotrexate (RHEUMATREX) 2.5 MG tablet, Take 20 mg by mouth once a week. Caution:Chemotherapy. Protect from light., Disp: , Rfl:  .  NIFEdipine (PROCARDIA XL/ADALAT-CC) 60 MG 24 hr tablet, Take 1 tablet (60  mg total) by mouth daily., Disp: 30 tablet, Rfl: 2 .  polyethylene glycol (MIRALAX / GLYCOLAX) packet, Take 17 g by mouth daily as needed., Disp: , Rfl:  .  predniSONE (DELTASONE) 10 MG tablet, Take 10 mg by mouth daily., Disp: , Rfl:  .  predniSONE (DELTASONE) 5 MG tablet, Take one tablet by mouth once daily for one week and every other week thereafter, Disp: , Rfl:   Allergies  Allergen Reactions  . Penicillins Rash  . Sulfa Antibiotics Rash    ROS  She continues to have problems with her balance, some dysarthria, difficulty with fine motor skills, but overall slightly better Denies headaches, nausea or vomiting, she is not very hungry. No fever or chills.   Objective  Vitals:   07/12/17 1037  BP: 130/90  Pulse: 86  Resp: 14  SpO2: 98%  Weight: 117 lb 9.6 oz (53.3 kg)  Height: 5' (1.524 m)    Body mass index is 22.97 kg/m.    Physical Exam   Constitutional: Patient appears well-developed and well-nourished.  No distress.  HEENT: head showed bruising on right frontal area, still tender on left cheek during palpation ( recent fracture) , normocephalic, pupils equal and reactive to light,  neck supple, throat within normal limits Cardiovascular: Normal rate, regular rhythm and normal heart sounds.  No murmur heard. No BLE edema. Pulmonary/Chest: Effort normal and breath sounds normal. No respiratory distress. Abdominal: Soft.  There is no tenderness. Psychiatric: Patient has a normal mood and affect. behavior is normal, however difficulty recalling some events and relying on friend and caregiver to fill in the blanks. Neurology: normal cranial nerves, normal strength, Romberg negative, still has some dysarthria, no nystagmus, needs assistance with gait   Recent Results (from the past 2160 hour(s))  Basic metabolic panel     Status: Abnormal   Collection Time: 04/28/17  1:49 PM  Result Value Ref Range   Sodium 133 (L) 135 - 145 mmol/L   Potassium 3.9 3.5 - 5.1 mmol/L    Chloride 95 (L) 101 - 111 mmol/L   CO2 26 22 - 32 mmol/L   Glucose, Bld 119 (H) 65 - 99 mg/dL   BUN 12 6 - 20 mg/dL   Creatinine, Ser 0.78 0.44 - 1.00 mg/dL   Calcium 9.6 8.9 - 10.3 mg/dL   GFR calc non Af Amer >60 >60 mL/min   GFR calc Af Amer >60 >60 mL/min    Comment: (NOTE) The eGFR has been calculated using the CKD EPI equation. This calculation has not been validated in all clinical situations. eGFR's persistently <60 mL/min signify possible Chronic Kidney Disease.    Anion gap 12 5 - 15  CBC     Status: None   Collection Time: 04/28/17  1:49 PM  Result Value Ref Range  WBC 9.6 3.6 - 11.0 K/uL   RBC 4.40 3.80 - 5.20 MIL/uL   Hemoglobin 14.2 12.0 - 16.0 g/dL   HCT 42.0 35.0 - 47.0 %   MCV 95.5 80.0 - 100.0 fL   MCH 32.3 26.0 - 34.0 pg   MCHC 33.8 32.0 - 36.0 g/dL   RDW 14.2 11.5 - 14.5 %   Platelets 398 150 - 440 K/uL  Urinalysis, Complete w Microscopic     Status: Abnormal   Collection Time: 04/28/17  1:57 PM  Result Value Ref Range   Color, Urine YELLOW (A) YELLOW   APPearance CLOUDY (A) CLEAR   Specific Gravity, Urine 1.009 1.005 - 1.030   pH 8.0 5.0 - 8.0   Glucose, UA NEGATIVE NEGATIVE mg/dL   Hgb urine dipstick NEGATIVE NEGATIVE   Bilirubin Urine NEGATIVE NEGATIVE   Ketones, ur NEGATIVE NEGATIVE mg/dL   Protein, ur NEGATIVE NEGATIVE mg/dL   Nitrite NEGATIVE NEGATIVE   Leukocytes, UA MODERATE (A) NEGATIVE   RBC / HPF 0-5 0 - 5 RBC/hpf   WBC, UA 6-30 0 - 5 WBC/hpf   Bacteria, UA RARE (A) NONE SEEN   Squamous Epithelial / LPF 0-5 (A) NONE SEEN   Mucus PRESENT    Amorphous Crystal PRESENT    Non Squamous Epithelial 0-5 (A) NONE SEEN  Urine Culture     Status: Abnormal   Collection Time: 04/28/17  4:21 PM  Result Value Ref Range   Specimen Description URINE, RANDOM    Special Requests Normal    Culture >=100,000 COLONIES/mL KLEBSIELLA PNEUMONIAE (A)    Report Status 05/01/2017 FINAL    Organism ID, Bacteria KLEBSIELLA PNEUMONIAE (A)       Susceptibility    Klebsiella pneumoniae - MIC*    AMPICILLIN >=32 RESISTANT Resistant     CEFAZOLIN <=4 SENSITIVE Sensitive     CEFTRIAXONE <=1 SENSITIVE Sensitive     CIPROFLOXACIN <=0.25 SENSITIVE Sensitive     GENTAMICIN <=1 SENSITIVE Sensitive     IMIPENEM <=0.25 SENSITIVE Sensitive     NITROFURANTOIN 128 RESISTANT Resistant     TRIMETH/SULFA <=20 SENSITIVE Sensitive     AMPICILLIN/SULBACTAM 4 SENSITIVE Sensitive     PIP/TAZO <=4 SENSITIVE Sensitive     Extended ESBL NEGATIVE Sensitive     * >=100,000 COLONIES/mL KLEBSIELLA PNEUMONIAE  Glucose, capillary     Status: Abnormal   Collection Time: 04/28/17 10:42 PM  Result Value Ref Range   Glucose-Capillary 139 (H) 65 - 99 mg/dL   Comment 1 Notify RN   Basic metabolic panel     Status: Abnormal   Collection Time: 04/29/17  4:50 AM  Result Value Ref Range   Sodium 133 (L) 135 - 145 mmol/L   Potassium 3.6 3.5 - 5.1 mmol/L   Chloride 99 (L) 101 - 111 mmol/L   CO2 26 22 - 32 mmol/L   Glucose, Bld 119 (H) 65 - 99 mg/dL   BUN 12 6 - 20 mg/dL   Creatinine, Ser 0.53 0.44 - 1.00 mg/dL   Calcium 8.3 (L) 8.9 - 10.3 mg/dL   GFR calc non Af Amer >60 >60 mL/min   GFR calc Af Amer >60 >60 mL/min    Comment: (NOTE) The eGFR has been calculated using the CKD EPI equation. This calculation has not been validated in all clinical situations. eGFR's persistently <60 mL/min signify possible Chronic Kidney Disease.    Anion gap 8 5 - 15  CBC     Status: None   Collection Time: 04/29/17  4:50 AM  Result Value Ref Range   WBC 9.8 3.6 - 11.0 K/uL   RBC 3.96 3.80 - 5.20 MIL/uL   Hemoglobin 13.1 12.0 - 16.0 g/dL   HCT 38.1 35.0 - 47.0 %   MCV 96.1 80.0 - 100.0 fL   MCH 33.2 26.0 - 34.0 pg   MCHC 34.5 32.0 - 36.0 g/dL   RDW 14.2 11.5 - 14.5 %   Platelets 361 150 - 440 K/uL  Glucose, capillary     Status: Abnormal   Collection Time: 04/29/17  7:46 AM  Result Value Ref Range   Glucose-Capillary 117 (H) 65 - 99 mg/dL  Glucose, capillary     Status: Abnormal    Collection Time: 04/29/17 11:49 AM  Result Value Ref Range   Glucose-Capillary 121 (H) 65 - 99 mg/dL  Glucose, capillary     Status: Abnormal   Collection Time: 04/29/17  4:43 PM  Result Value Ref Range   Glucose-Capillary 109 (H) 65 - 99 mg/dL  Glucose, capillary     Status: Abnormal   Collection Time: 04/29/17  8:34 PM  Result Value Ref Range   Glucose-Capillary 156 (H) 65 - 99 mg/dL  CBC with Differential     Status: Abnormal   Collection Time: 04/30/17  4:36 AM  Result Value Ref Range   WBC 12.4 (H) 3.6 - 11.0 K/uL   RBC 3.92 3.80 - 5.20 MIL/uL   Hemoglobin 13.0 12.0 - 16.0 g/dL   HCT 37.7 35.0 - 47.0 %   MCV 96.0 80.0 - 100.0 fL   MCH 33.1 26.0 - 34.0 pg   MCHC 34.4 32.0 - 36.0 g/dL   RDW 14.2 11.5 - 14.5 %   Platelets 339 150 - 440 K/uL   Neutrophils Relative % 83 %   Neutro Abs 10.2 (H) 1.4 - 6.5 K/uL   Lymphocytes Relative 10 %   Lymphs Abs 1.2 1.0 - 3.6 K/uL   Monocytes Relative 6 %   Monocytes Absolute 0.8 0.2 - 0.9 K/uL   Eosinophils Relative 1 %   Eosinophils Absolute 0.2 0 - 0.7 K/uL   Basophils Relative 0 %   Basophils Absolute 0.1 0 - 0.1 K/uL  HIV antibody     Status: None   Collection Time: 04/30/17  4:36 AM  Result Value Ref Range   HIV Screen 4th Generation wRfx Non Reactive Non Reactive    Comment: (NOTE) Performed At: [facility] [place], [place] [id number] Rush Farmer MD [id number]   Hepatitis B surface antigen     Status: None   Collection Time: 04/30/17  4:36 AM  Result Value Ref Range   Hepatitis B Surface Ag Negative Negative    Comment: (NOTE) Performed At: [facility] [place], [place] [id number] Rush Farmer MD [id number]   Lactate dehydrogenase     Status: None   Collection Time: 04/30/17  4:36 AM  Result Value Ref Range   LDH 120 98 - 192 U/L  Glucose, capillary     Status: Abnormal   Collection Time: 04/30/17  7:50 AM  Result Value Ref Range   Glucose-Capillary 124  (H) 65 - 99 mg/dL  Glucose, capillary     Status: Abnormal   Collection Time: 04/30/17 12:03 PM  Result Value Ref Range   Glucose-Capillary 114 (H) 65 - 99 mg/dL  Glucose, capillary     Status: Abnormal   Collection Time: 04/30/17  5:06 PM  Result Value Ref Range   Glucose-Capillary  128 (H) 65 - 99 mg/dL  Glucose, capillary     Status: Abnormal   Collection Time: 04/30/17  8:21 PM  Result Value Ref Range   Glucose-Capillary 159 (H) 65 - 99 mg/dL  Glucose, capillary     Status: Abnormal   Collection Time: 05/01/17  7:45 AM  Result Value Ref Range   Glucose-Capillary 132 (H) 65 - 99 mg/dL  Glucose, capillary     Status: Abnormal   Collection Time: 05/01/17 12:00 PM  Result Value Ref Range   Glucose-Capillary 117 (H) 65 - 99 mg/dL  CULTURE, BLOOD (ROUTINE X 2) w Reflex to ID Panel     Status: None   Collection Time: 05/01/17  3:49 PM  Result Value Ref Range   Specimen Description BLOOD LEFT HAND    Special Requests      BOTTLES DRAWN AEROBIC AND ANAEROBIC Blood Culture results may not be optimal due to an excessive volume of blood received in culture bottles   Culture NO GROWTH 5 DAYS    Report Status 05/06/2017 FINAL   CULTURE, BLOOD (ROUTINE X 2) w Reflex to ID Panel     Status: None   Collection Time: 05/01/17  3:57 PM  Result Value Ref Range   Specimen Description BLOOD RAC    Special Requests      BOTTLES DRAWN AEROBIC AND ANAEROBIC Blood Culture adequate volume   Culture NO GROWTH 5 DAYS    Report Status 05/06/2017 FINAL   Urine Culture     Status: Abnormal   Collection Time: 05/01/17  4:39 PM  Result Value Ref Range   Specimen Description URINE, RANDOM    Special Requests NONE    Culture (A)     <10,000 COLONIES/mL INSIGNIFICANT GROWTH Performed at [facility], [address], [place], [place] [address]    Report Status 05/03/2017 FINAL   Glucose, capillary     Status: Abnormal   Collection Time: 05/01/17  4:40 PM  Result Value Ref Range    Glucose-Capillary 125 (H) 65 - 99 mg/dL  Glucose, capillary     Status: Abnormal   Collection Time: 05/01/17  9:22 PM  Result Value Ref Range   Glucose-Capillary 128 (H) 65 - 99 mg/dL  Glucose, capillary     Status: Abnormal   Collection Time: 05/02/17  7:56 AM  Result Value Ref Range   Glucose-Capillary 147 (H) 65 - 99 mg/dL  Glucose, capillary     Status: Abnormal   Collection Time: 05/02/17 11:59 AM  Result Value Ref Range   Glucose-Capillary 132 (H) 65 - 99 mg/dL  Glucose, capillary     Status: Abnormal   Collection Time: 05/02/17  4:47 PM  Result Value Ref Range   Glucose-Capillary 152 (H) 65 - 99 mg/dL  Glucose, capillary     Status: Abnormal   Collection Time: 05/02/17  9:08 PM  Result Value Ref Range   Glucose-Capillary 117 (H) 65 - 99 mg/dL   Comment 1 Notify RN    Comment 2 Document in Chart   Creatinine, serum     Status: None   Collection Time: 05/03/17  7:05 AM  Result Value Ref Range   Creatinine, Ser 0.53 0.44 - 1.00 mg/dL   GFR calc non Af Amer >60 >60 mL/min   GFR calc Af Amer >60 >60 mL/min    Comment: (NOTE) The eGFR has been calculated using the CKD EPI equation. This calculation has not been validated in all clinical situations. eGFR's persistently <60  mL/min signify possible Chronic Kidney Disease.   CBC     Status: None   Collection Time: 05/03/17  7:05 AM  Result Value Ref Range   WBC 9.6 3.6 - 11.0 K/uL   RBC 3.97 3.80 - 5.20 MIL/uL   Hemoglobin 13.0 12.0 - 16.0 g/dL   HCT 37.8 35.0 - 47.0 %   MCV 95.3 80.0 - 100.0 fL   MCH 32.8 26.0 - 34.0 pg   MCHC 34.4 32.0 - 36.0 g/dL   RDW 14.0 11.5 - 14.5 %   Platelets 344 150 - 440 K/uL  Glucose, capillary     Status: Abnormal   Collection Time: 05/03/17  7:54 AM  Result Value Ref Range   Glucose-Capillary 116 (H) 65 - 99 mg/dL  Glucose, capillary     Status: Abnormal   Collection Time: 05/03/17 11:36 AM  Result Value Ref Range   Glucose-Capillary 142 (H) 65 - 99 mg/dL  Glucose, capillary      Status: Abnormal   Collection Time: 05/03/17  5:03 PM  Result Value Ref Range   Glucose-Capillary 120 (H) 65 - 99 mg/dL  Glucose, capillary     Status: Abnormal   Collection Time: 05/03/17  7:49 PM  Result Value Ref Range   Glucose-Capillary 120 (H) 65 - 99 mg/dL   Comment 1 Notify RN   POCT HgB A1C     Status: Abnormal   Collection Time: 06/05/17 11:03 AM  Result Value Ref Range   Hemoglobin A1C 5.9      Assessment & Plan  1. Syncope, unspecified syncope type  -MRI with and without contrast  2. Facial trauma, initial encounter  From recent falls  3. Abnormal gait  Stable   4. ADEM (acute disseminated encephalomyelitis) (postinfectious)  -MRI with and without contrast, after discussion with radiologist.

## 2017-07-14 ENCOUNTER — Encounter: Payer: Self-pay | Admitting: Family Medicine

## 2017-07-18 ENCOUNTER — Encounter: Payer: Self-pay | Admitting: Family Medicine

## 2017-07-18 ENCOUNTER — Ambulatory Visit (INDEPENDENT_AMBULATORY_CARE_PROVIDER_SITE_OTHER): Payer: Medicare Other | Admitting: Family Medicine

## 2017-07-18 VITALS — BP 114/62 | HR 108 | Temp 98.7°F | Resp 18 | Ht 60.0 in | Wt 117.5 lb

## 2017-07-18 DIAGNOSIS — Z9181 History of falling: Secondary | ICD-10-CM

## 2017-07-18 DIAGNOSIS — M05731 Rheumatoid arthritis with rheumatoid factor of right wrist without organ or systems involvement: Secondary | ICD-10-CM | POA: Diagnosis not present

## 2017-07-18 DIAGNOSIS — R270 Ataxia, unspecified: Secondary | ICD-10-CM | POA: Diagnosis not present

## 2017-07-18 DIAGNOSIS — E1121 Type 2 diabetes mellitus with diabetic nephropathy: Secondary | ICD-10-CM | POA: Diagnosis not present

## 2017-07-18 DIAGNOSIS — G0401 Postinfectious acute disseminated encephalitis and encephalomyelitis (postinfectious ADEM): Secondary | ICD-10-CM | POA: Diagnosis not present

## 2017-07-18 DIAGNOSIS — I10 Essential (primary) hypertension: Secondary | ICD-10-CM | POA: Diagnosis not present

## 2017-07-18 DIAGNOSIS — R269 Unspecified abnormalities of gait and mobility: Secondary | ICD-10-CM

## 2017-07-18 DIAGNOSIS — M05732 Rheumatoid arthritis with rheumatoid factor of left wrist without organ or systems involvement: Secondary | ICD-10-CM

## 2017-07-18 NOTE — Progress Notes (Addendum)
Name: Renee Cowan   MRN: [id number]    DOB: 1945/07/22   Date:07/18/2017       Progress Note  Subjective  Chief Complaint  Chief Complaint  Patient presents with  . Follow-up    6 week F/U  . Hypertension    Dizziness-Bp ranges from 108/82-180/91 but was at Occupational Therapy  . Diabetes    Checking BS every day, Average-139     HPI  Caregiver here with her: Marlene Lard from [facility]  ADEM: admitted 04/28/2017 abnormal MRI brain, transferred to [facility] 05/04/2017 and discharged  11/13 , waiting for biopsy results, biopsy was negative 11/19 and re-admitted on 11/21 through the [facility] - recommended by Dr. Lucy Antigua ophthalmologist because of abnormal finding on the eye exam. She was found to have ADEM and was started on Prednisone she was doing slightly better but over the past 2 weeks she has fallen three time , the second  episode was not witnessed and she was unsure of how long she was out, the third fall was also not witnessed, caregiver noticed a bruise two days ago, on her right eye (new) We repeated last week  MRI and there was a change of lesions with some worsening of white matter disease, contacted her neurologist this week , Dr. Lauralee Evener , and he is trying to get her an earlier appointment to see him.  No falls in the past week, but still have PT/OT and has to use a walker, needs assistance preparing meals, bathing, medication management ( pill packs - but she takes it by herself)  keeping up with appointments. Also needs assistance with laundry and taking care of her house.   DMII: she is still on prednisone daily and tapering for ADEM but glucose highest over the past couple of weeks was 200, levels usually at goal now  she denies polyphagia, polydipsia or polyuria.   Diplopia: resolved   RA: she states she has noticed that hands are a little more stiff lately, still on prednisone for ADEM and takes methotrexate weekly. She sees Rheumatologist   HTN: bp when checked  by PT goes up and down, but when checked at rest bp has been 112-138/65-80 and we will continue current regiment, no chest pain or palpitation    Patient Active Problem List   Diagnosis Date Noted  . Diplopia 06/26/2017  . 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 Imbalance 05/15/2017  . Ataxia   . Abnormal MRI of head   . GAD (generalized anxiety disorder) 04/25/2017  . Moderate recurrent major depression ([place]) 04/25/2017  . Type 2 diabetes mellitus with microalbuminuria, without long-term current use of insulin ([place]) 11/28/2015  . Benign essential HTN 05/26/2015  . Depression, major, in remission ([place]) 05/26/2015  . Dyslipidemia 05/26/2015  . Fibromyalgia syndrome 05/26/2015  . Personal history of malignant melanoma of skin 05/26/2015  . Adult hypothyroidism 05/26/2015  . Osteopenia after menopause 05/26/2015  . Tobacco use 05/26/2015  . Vitamin D deficiency 05/26/2015  . Rheumatoid arthritis ([place]) 05/26/2015  . Acquired trigger finger 10/11/2007    Past Surgical History:  Procedure Laterality Date  . CATARACT EXTRACTION, BILATERAL Bilateral [id number] and [id number]  . CHOLECYSTECTOMY    . CYST REMOVAL TRUNK    . melaoma excesion    . trigger thumb release      Family History  Problem Relation Age of Onset  . Cancer Mother        cervical  . Tuberculosis Father   .  Kidney cancer Neg Hx   . Bladder Cancer Neg Hx     Social History   Socioeconomic History  . Marital status: Married    Spouse name: Not on file  . Number of children: Not on file  . Years of education: Not on file  . Highest education level: Not on file  Social Needs  . Financial resource strain: Not on file  . Food insecurity - worry: Not on file  . Food insecurity - inability: Not on file  . Transportation needs - medical: Not on file  . Transportation needs - non-medical: Not on file  Occupational History  . Not on file  Tobacco Use  . Smoking status: Former  Smoker    Packs/day: 0.50    Years: 40.00    Pack years: 20.00    Types: Cigarettes    Start date: 12/03/1976    Last attempt to quit: 05/09/2017    Years since quitting: 0.1  . Smokeless tobacco: Never Used  Substance and Sexual Activity  . Alcohol use: Yes    Alcohol/week: 0.0 oz    Comment: occasionally  . Drug use: No  . Sexual activity: No  Other Topics Concern  . Not on file  Social History Narrative  . Not on file     Current Outpatient Medications:  .  Calcium Carb-Cholecalciferol (CALCIUM + D3) 600-200 MG-UNIT TABS, Take by mouth., Disp: , Rfl:  .  cholecalciferol (VITAMIN D) 1000 units tablet, Take 1 tablet (1,000 Units total) by mouth daily., Disp: 30 tablet, Rfl: 5 .  Cyanocobalamin (B-12) 500 MCG TABS, Take 1 tablet by mouth daily., Disp: , Rfl: 5 .  cyanocobalamin 500 MCG tablet, Take 1 tablet (500 mcg total) by mouth daily., Disp: 30 tablet, Rfl: 5 .  cyclobenzaprine (FLEXERIL) 5 MG tablet, Take 5 mg by mouth 3 (three) times daily as needed for muscle spasms., Disp: , Rfl:  .  DULoxetine (CYMBALTA) 60 MG capsule, Take 1 capsule (60 mg total) by mouth daily., Disp: 30 capsule, Rfl: 5 .  folic acid (FOLVITE) 1 MG tablet, Take 1 mg by mouth daily., Disp: , Rfl: 1 .  levothyroxine (SYNTHROID, LEVOTHROID) 50 MCG tablet, TAKE 1 TABLET BY MOUTH  DAILY BEFORE BREAKFAST, Disp: 90 tablet, Rfl: 1 .  lisinopril (PRINIVIL,ZESTRIL) 20 MG tablet, Take 1 tablet (20 mg total) by mouth daily., Disp: 30 tablet, Rfl: 2 .  Magnesium 250 MG TABS, Take 1 tablet (250 mg total) by mouth daily., Disp: 30 tablet, Rfl: 5 .  Magnesium Oxide 250 MG TABS, Take 1 tablet by mouth daily., Disp: , Rfl: 5 .  meloxicam (MOBIC) 15 MG tablet, Take 15 mg by mouth as needed for pain., Disp: , Rfl:  .  methotrexate (RHEUMATREX) 2.5 MG tablet, Take 20 mg by mouth once a week. Caution:Chemotherapy. Protect from light., Disp: , Rfl:  .  NIFEdipine (PROCARDIA XL/ADALAT-CC) 60 MG 24 hr tablet, Take 1 tablet (60  mg total) by mouth daily., Disp: 30 tablet, Rfl: 2 .  polyethylene glycol (MIRALAX / GLYCOLAX) packet, Take 17 g by mouth daily as needed., Disp: , Rfl:  .  predniSONE (DELTASONE) 10 MG tablet, Take 10 mg by mouth daily., Disp: , Rfl:  .  predniSONE (DELTASONE) 5 MG tablet, Take one tablet by mouth once daily for one week and every other week thereafter, Disp: , Rfl:  .  acetaminophen (TYLENOL) 325 MG tablet, Take 2 tablets (650 mg total) by mouth every 6 (six) hours as needed  for mild pain (or Fever >/= 101). (Patient not taking: Reported on 07/18/2017), Disp: 30 tablet, Rfl: 2  Allergies  Allergen Reactions  . Penicillins Rash  . Sulfa Antibiotics Rash     ROS  Constitutional: Negative for fever or weight change.  Respiratory: Negative for cough and shortness of breath.   Cardiovascular: Negative for chest pain or palpitations.  Gastrointestinal: Negative for abdominal pain, no bowel changes.  Musculoskeletal: Positive for gait problem and intermittent  joint swelling.  Skin: Negative for rash.  Neurological: Negative for dizziness or headache.  No other specific complaints in a complete review of systems (except as listed in HPI above).  Objective  Vitals:   07/18/17 0916  BP: 114/62  Pulse: (!) 108  Resp: 18  Temp: 98.7 F (37.1 C)  TempSrc: Oral  SpO2: 97%  Weight: 117 lb 8 oz (53.3 kg)  Height: 5' (1.524 m)    Body mass index is 22.95 kg/m.  Physical Exam  Constitutional: Patient appears well-developed and well-nourished.  No distress.  HEENT: head atraumatic, normocephalic, pupils equal and reactive to light,  neck supple, throat within normal limits Cardiovascular: Normal rate, regular rhythm and normal heart sounds.  No murmur heard. No BLE edema. Pulmonary/Chest: Effort normal and breath sounds normal. No respiratory distress. Abdominal: Soft.  There is no tenderness. Psychiatric: Patient has a normal mood and affect. behavior is normal. Judgment and thought  content normal. Neurological: using walker, dysarthria, cognitive dysfunction - difficulty with time perception   Recent Results (from the past 2160 hour(s))  Basic metabolic panel     Status: Abnormal   Collection Time: 04/28/17  1:49 PM  Result Value Ref Range   Sodium 133 (L) 135 - 145 mmol/L   Potassium 3.9 3.5 - 5.1 mmol/L   Chloride 95 (L) 101 - 111 mmol/L   CO2 26 22 - 32 mmol/L   Glucose, Bld 119 (H) 65 - 99 mg/dL   BUN 12 6 - 20 mg/dL   Creatinine, Ser 0.78 0.44 - 1.00 mg/dL   Calcium 9.6 8.9 - 10.3 mg/dL   GFR calc non Af Amer >60 >60 mL/min   GFR calc Af Amer >60 >60 mL/min    Comment: (NOTE) The eGFR has been calculated using the CKD EPI equation. This calculation has not been validated in all clinical situations. eGFR's persistently <60 mL/min signify possible Chronic Kidney Disease.    Anion gap 12 5 - 15  CBC     Status: None   Collection Time: 04/28/17  1:49 PM  Result Value Ref Range   WBC 9.6 3.6 - 11.0 K/uL   RBC 4.40 3.80 - 5.20 MIL/uL   Hemoglobin 14.2 12.0 - 16.0 g/dL   HCT 42.0 35.0 - 47.0 %   MCV 95.5 80.0 - 100.0 fL   MCH 32.3 26.0 - 34.0 pg   MCHC 33.8 32.0 - 36.0 g/dL   RDW 14.2 11.5 - 14.5 %   Platelets 398 150 - 440 K/uL  Urinalysis, Complete w Microscopic     Status: Abnormal   Collection Time: 04/28/17  1:57 PM  Result Value Ref Range   Color, Urine YELLOW (A) YELLOW   APPearance CLOUDY (A) CLEAR   Specific Gravity, Urine 1.009 1.005 - 1.030   pH 8.0 5.0 - 8.0   Glucose, UA NEGATIVE NEGATIVE mg/dL   Hgb urine dipstick NEGATIVE NEGATIVE   Bilirubin Urine NEGATIVE NEGATIVE   Ketones, ur NEGATIVE NEGATIVE mg/dL   Protein, ur NEGATIVE NEGATIVE mg/dL  Nitrite NEGATIVE NEGATIVE   Leukocytes, UA MODERATE (A) NEGATIVE   RBC / HPF 0-5 0 - 5 RBC/hpf   WBC, UA 6-30 0 - 5 WBC/hpf   Bacteria, UA RARE (A) NONE SEEN   Squamous Epithelial / LPF 0-5 (A) NONE SEEN   Mucus PRESENT    Amorphous Crystal PRESENT    Non Squamous Epithelial 0-5 (A) NONE  SEEN  Urine Culture     Status: Abnormal   Collection Time: 04/28/17  4:21 PM  Result Value Ref Range   Specimen Description URINE, RANDOM    Special Requests Normal    Culture >=100,000 COLONIES/mL KLEBSIELLA PNEUMONIAE (A)    Report Status 05/01/2017 FINAL    Organism ID, Bacteria KLEBSIELLA PNEUMONIAE (A)       Susceptibility   Klebsiella pneumoniae - MIC*    AMPICILLIN >=32 RESISTANT Resistant     CEFAZOLIN <=4 SENSITIVE Sensitive     CEFTRIAXONE <=1 SENSITIVE Sensitive     CIPROFLOXACIN <=0.25 SENSITIVE Sensitive     GENTAMICIN <=1 SENSITIVE Sensitive     IMIPENEM <=0.25 SENSITIVE Sensitive     NITROFURANTOIN 128 RESISTANT Resistant     TRIMETH/SULFA <=20 SENSITIVE Sensitive     AMPICILLIN/SULBACTAM 4 SENSITIVE Sensitive     PIP/TAZO <=4 SENSITIVE Sensitive     Extended ESBL NEGATIVE Sensitive     * >=100,000 COLONIES/mL KLEBSIELLA PNEUMONIAE  Glucose, capillary     Status: Abnormal   Collection Time: 04/28/17 10:42 PM  Result Value Ref Range   Glucose-Capillary 139 (H) 65 - 99 mg/dL   Comment 1 Notify RN   Basic metabolic panel     Status: Abnormal   Collection Time: 04/29/17  4:50 AM  Result Value Ref Range   Sodium 133 (L) 135 - 145 mmol/L   Potassium 3.6 3.5 - 5.1 mmol/L   Chloride 99 (L) 101 - 111 mmol/L   CO2 26 22 - 32 mmol/L   Glucose, Bld 119 (H) 65 - 99 mg/dL   BUN 12 6 - 20 mg/dL   Creatinine, Ser 0.53 0.44 - 1.00 mg/dL   Calcium 8.3 (L) 8.9 - 10.3 mg/dL   GFR calc non Af Amer >60 >60 mL/min   GFR calc Af Amer >60 >60 mL/min    Comment: (NOTE) The eGFR has been calculated using the CKD EPI equation. This calculation has not been validated in all clinical situations. eGFR's persistently <60 mL/min signify possible Chronic Kidney Disease.    Anion gap 8 5 - 15  CBC     Status: None   Collection Time: 04/29/17  4:50 AM  Result Value Ref Range   WBC 9.8 3.6 - 11.0 K/uL   RBC 3.96 3.80 - 5.20 MIL/uL   Hemoglobin 13.1 12.0 - 16.0 g/dL   HCT 38.1 35.0 -  47.0 %   MCV 96.1 80.0 - 100.0 fL   MCH 33.2 26.0 - 34.0 pg   MCHC 34.5 32.0 - 36.0 g/dL   RDW 14.2 11.5 - 14.5 %   Platelets 361 150 - 440 K/uL  Glucose, capillary     Status: Abnormal   Collection Time: 04/29/17  7:46 AM  Result Value Ref Range   Glucose-Capillary 117 (H) 65 - 99 mg/dL  Glucose, capillary     Status: Abnormal   Collection Time: 04/29/17 11:49 AM  Result Value Ref Range   Glucose-Capillary 121 (H) 65 - 99 mg/dL  Glucose, capillary     Status: Abnormal   Collection Time: 04/29/17  4:43 PM  Result Value Ref Range   Glucose-Capillary 109 (H) 65 - 99 mg/dL  Glucose, capillary     Status: Abnormal   Collection Time: 04/29/17  8:34 PM  Result Value Ref Range   Glucose-Capillary 156 (H) 65 - 99 mg/dL  CBC with Differential     Status: Abnormal   Collection Time: 04/30/17  4:36 AM  Result Value Ref Range   WBC 12.4 (H) 3.6 - 11.0 K/uL   RBC 3.92 3.80 - 5.20 MIL/uL   Hemoglobin 13.0 12.0 - 16.0 g/dL   HCT 37.7 35.0 - 47.0 %   MCV 96.0 80.0 - 100.0 fL   MCH 33.1 26.0 - 34.0 pg   MCHC 34.4 32.0 - 36.0 g/dL   RDW 14.2 11.5 - 14.5 %   Platelets 339 150 - 440 K/uL   Neutrophils Relative % 83 %   Neutro Abs 10.2 (H) 1.4 - 6.5 K/uL   Lymphocytes Relative 10 %   Lymphs Abs 1.2 1.0 - 3.6 K/uL   Monocytes Relative 6 %   Monocytes Absolute 0.8 0.2 - 0.9 K/uL   Eosinophils Relative 1 %   Eosinophils Absolute 0.2 0 - 0.7 K/uL   Basophils Relative 0 %   Basophils Absolute 0.1 0 - 0.1 K/uL  HIV antibody     Status: None   Collection Time: 04/30/17  4:36 AM  Result Value Ref Range   HIV Screen 4th Generation wRfx Non Reactive Non Reactive    Comment: (NOTE) Performed At: [facility] [place], [place] [id number] Rush Farmer MD [id number]   Hepatitis B surface antigen     Status: None   Collection Time: 04/30/17  4:36 AM  Result Value Ref Range   Hepatitis B Surface Ag Negative Negative    Comment: (NOTE) Performed At: [facility] [place], [place] [id number] Rush Farmer MD [id number]   Lactate dehydrogenase     Status: None   Collection Time: 04/30/17  4:36 AM  Result Value Ref Range   LDH 120 98 - 192 U/L  Glucose, capillary     Status: Abnormal   Collection Time: 04/30/17  7:50 AM  Result Value Ref Range   Glucose-Capillary 124 (H) 65 - 99 mg/dL  Glucose, capillary     Status: Abnormal   Collection Time: 04/30/17 12:03 PM  Result Value Ref Range   Glucose-Capillary 114 (H) 65 - 99 mg/dL  Glucose, capillary     Status: Abnormal   Collection Time: 04/30/17  5:06 PM  Result Value Ref Range   Glucose-Capillary 128 (H) 65 - 99 mg/dL  Glucose, capillary     Status: Abnormal   Collection Time: 04/30/17  8:21 PM  Result Value Ref Range   Glucose-Capillary 159 (H) 65 - 99 mg/dL  Glucose, capillary     Status: Abnormal   Collection Time: 05/01/17  7:45 AM  Result Value Ref Range   Glucose-Capillary 132 (H) 65 - 99 mg/dL  Glucose, capillary     Status: Abnormal   Collection Time: 05/01/17 12:00 PM  Result Value Ref Range   Glucose-Capillary 117 (H) 65 - 99 mg/dL  CULTURE, BLOOD (ROUTINE X 2) w Reflex to ID Panel     Status: None   Collection Time: 05/01/17  3:49 PM  Result Value Ref Range   Specimen Description BLOOD LEFT HAND    Special Requests      BOTTLES DRAWN AEROBIC AND ANAEROBIC Blood Culture results may not be optimal due to  an excessive volume of blood received in culture bottles   Culture NO GROWTH 5 DAYS    Report Status 05/06/2017 FINAL   CULTURE, BLOOD (ROUTINE X 2) w Reflex to ID Panel     Status: None   Collection Time: 05/01/17  3:57 PM  Result Value Ref Range   Specimen Description BLOOD RAC    Special Requests      BOTTLES DRAWN AEROBIC AND ANAEROBIC Blood Culture adequate volume   Culture NO GROWTH 5 DAYS    Report Status 05/06/2017 FINAL   Urine Culture     Status: Abnormal   Collection Time: 05/01/17  4:39 PM  Result Value Ref Range   Specimen  Description URINE, RANDOM    Special Requests NONE    Culture (A)     <10,000 COLONIES/mL INSIGNIFICANT GROWTH Performed at [facility], [address] [place], [place] [address]    Report Status 05/03/2017 FINAL   Glucose, capillary     Status: Abnormal   Collection Time: 05/01/17  4:40 PM  Result Value Ref Range   Glucose-Capillary 125 (H) 65 - 99 mg/dL  Glucose, capillary     Status: Abnormal   Collection Time: 05/01/17  9:22 PM  Result Value Ref Range   Glucose-Capillary 128 (H) 65 - 99 mg/dL  Glucose, capillary     Status: Abnormal   Collection Time: 05/02/17  7:56 AM  Result Value Ref Range   Glucose-Capillary 147 (H) 65 - 99 mg/dL  Glucose, capillary     Status: Abnormal   Collection Time: 05/02/17 11:59 AM  Result Value Ref Range   Glucose-Capillary 132 (H) 65 - 99 mg/dL  Glucose, capillary     Status: Abnormal   Collection Time: 05/02/17  4:47 PM  Result Value Ref Range   Glucose-Capillary 152 (H) 65 - 99 mg/dL  Glucose, capillary     Status: Abnormal   Collection Time: 05/02/17  9:08 PM  Result Value Ref Range   Glucose-Capillary 117 (H) 65 - 99 mg/dL   Comment 1 Notify RN    Comment 2 Document in Chart   Creatinine, serum     Status: None   Collection Time: 05/03/17  7:05 AM  Result Value Ref Range   Creatinine, Ser 0.53 0.44 - 1.00 mg/dL   GFR calc non Af Amer >60 >60 mL/min   GFR calc Af Amer >60 >60 mL/min    Comment: (NOTE) The eGFR has been calculated using the CKD EPI equation. This calculation has not been validated in all clinical situations. eGFR's persistently <60 mL/min signify possible Chronic Kidney Disease.   CBC     Status: None   Collection Time: 05/03/17  7:05 AM  Result Value Ref Range   WBC 9.6 3.6 - 11.0 K/uL   RBC 3.97 3.80 - 5.20 MIL/uL   Hemoglobin 13.0 12.0 - 16.0 g/dL   HCT 37.8 35.0 - 47.0 %   MCV 95.3 80.0 - 100.0 fL   MCH 32.8 26.0 - 34.0 pg   MCHC 34.4 32.0 - 36.0 g/dL   RDW 14.0 11.5 - 14.5 %   Platelets 344 150  - 440 K/uL  Glucose, capillary     Status: Abnormal   Collection Time: 05/03/17  7:54 AM  Result Value Ref Range   Glucose-Capillary 116 (H) 65 - 99 mg/dL  Glucose, capillary     Status: Abnormal   Collection Time: 05/03/17 11:36 AM  Result Value Ref Range   Glucose-Capillary 142 (H) 65 -  99 mg/dL  Glucose, capillary     Status: Abnormal   Collection Time: 05/03/17  5:03 PM  Result Value Ref Range   Glucose-Capillary 120 (H) 65 - 99 mg/dL  Glucose, capillary     Status: Abnormal   Collection Time: 05/03/17  7:49 PM  Result Value Ref Range   Glucose-Capillary 120 (H) 65 - 99 mg/dL   Comment 1 Notify RN   POCT HgB A1C     Status: Abnormal   Collection Time: 06/05/17 11:03 AM  Result Value Ref Range   Hemoglobin A1C 5.9   I-STAT creatinine     Status: None   Collection Time: 07/12/17  6:36 PM  Result Value Ref Range   Creatinine, Ser 0.80 0.44 - 1.00 mg/dL      PHQ2/9: Depression screen [facility] 2/9 06/05/2017 04/25/2017 03/25/2017 02/15/2017 02/11/2017  Decreased Interest 0 1 3 1 1   Down, Depressed, Hopeless 0 2 3 1 1   PHQ - 2 Score 0 3 6 2 2   Altered sleeping - 2 3 0 0  Tired, decreased energy - 2 3 1 1   Change in appetite - 1 3 1 1   Feeling bad or failure about yourself  - 3 0 0 0  Trouble concentrating - 2 3 1 1   Moving slowly or fidgety/restless - 3 0 0 0  Suicidal thoughts - 2 0 0 0  PHQ-9 Score - 18 18 5 5   Difficult doing work/chores - Not difficult at all Extremely dIfficult Somewhat difficult Somewhat difficult     Fall Risk: Fall Risk  06/05/2017 04/25/2017 03/25/2017 02/15/2017 02/11/2017  Falls in the past year? Yes Yes No Yes Yes  Comment - - - - -  Number falls in past yr: 2 or more 2 or more - 2 or more 2 or more  Injury with Fall? No Yes - No No  Risk for fall due to : - - - - -  Follow up - - - - -      Assessment & Plan  1. ADEM (acute disseminated encephalomyelitis) (postinfectious)  Keep follow up with Dr. Nigel Bridgeman  2. Abnormal gait  Continue  using walker even inside the house, continue PT   3. History of recent fall  But not over the past week  4. Ataxia  Since Fall 2018  5. Controlled type 2 diabetes mellitus with diabetic nephropathy, without long-term current use of insulin (HCC)  Continue monitoring glucose at home when off prednisone we may be able to stop Metformin   6. Benign essential HTN  Continue medication, bp is at goal, we will wait for the pharmacy to request refill.   7. 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

## 2017-07-18 NOTE — Patient Instructions (Signed)
Please contact [facility] neurologist - Dr. Nigel Bridgeman to find out when is her next appointment.  He told me yesterday he will try to see her sooner.  Continue BP medication since at home while at rest, bp has been at goal  She complained of some hand stiffness, likely from cold weather and rheumatoid arthritis, try warm compresses in the mornings, if she has pain she can take Tylenol   DM: seems to be under control, based on log and if blood sugar goes below 100 consistently ( after prednisone weaned off) we will consider stopping Metformin

## 2017-07-19 ENCOUNTER — Other Ambulatory Visit: Payer: Self-pay | Admitting: Family Medicine

## 2017-07-22 ENCOUNTER — Other Ambulatory Visit: Payer: Self-pay

## 2017-07-22 MED ORDER — BLOOD GLUCOSE METER KIT
PACK | 0 refills | Status: AC
Start: 2017-07-22 — End: ?

## 2017-07-22 NOTE — Telephone Encounter (Signed)
Refill request for diabetic medication:   Blood glucose meter kit  Last office visit pertaining to diabetes: 07/18/2017  Lab Results  Component Value Date   HGBA1C 5.9 06/05/2017   Follow-up on file. 09/16/2017

## 2017-07-30 ENCOUNTER — Other Ambulatory Visit: Payer: Self-pay | Admitting: Family Medicine

## 2017-07-30 ENCOUNTER — Encounter: Payer: Self-pay | Admitting: Family Medicine

## 2017-07-30 DIAGNOSIS — R2681 Unsteadiness on feet: Secondary | ICD-10-CM

## 2017-07-30 DIAGNOSIS — E559 Vitamin D deficiency, unspecified: Secondary | ICD-10-CM

## 2017-07-30 MED ORDER — VITAMIN D 50 MCG (2000 UT) PO TABS: 2000.0000 [IU] | ORAL_TABLET | Freq: Every day | ORAL | 5 refills | Status: AC

## 2017-08-05 ENCOUNTER — Other Ambulatory Visit: Payer: Self-pay | Admitting: Family Medicine

## 2017-08-05 DIAGNOSIS — I10 Essential (primary) hypertension: Secondary | ICD-10-CM

## 2017-08-06 NOTE — Telephone Encounter (Signed)
Appt resolved

## 2017-09-12 ENCOUNTER — Encounter: Payer: Self-pay | Admitting: Family Medicine

## 2017-09-16 ENCOUNTER — Other Ambulatory Visit: Payer: Self-pay

## 2017-09-16 ENCOUNTER — Ambulatory Visit: Payer: Medicare Other | Admitting: Family Medicine

## 2017-09-16 NOTE — Telephone Encounter (Signed)
Refill request for general medication. Lisinopril and Nifedipine to [facility].  Last office visit: 07/18/2017   No follow-ups on file.

## 2017-09-18 MED ORDER — NIFEDIPINE ER OSMOTIC RELEASE 60 MG PO TB24: 60.0000 mg | ORAL_TABLET | Freq: Every day | ORAL | 2 refills | Status: AC

## 2017-09-18 MED ORDER — LISINOPRIL 20 MG PO TABS: 20.0000 mg | ORAL_TABLET | Freq: Every day | ORAL | 2 refills | Status: AC

## 2018-02-15 ENCOUNTER — Other Ambulatory Visit: Payer: Self-pay

## 2018-02-15 ENCOUNTER — Emergency Department
Admission: EM | Admit: 2018-02-15 | Discharge: 2018-02-15 | Disposition: A | Discharge status: Home / Self Care | Payer: Medicare Other | Attending: Emergency Medicine | Admitting: Emergency Medicine

## 2018-02-15 ENCOUNTER — Emergency Department: Payer: Medicare Other

## 2018-02-15 ENCOUNTER — Encounter: Payer: Self-pay | Admitting: *Deleted

## 2018-02-15 DIAGNOSIS — E119 Type 2 diabetes mellitus without complications: Secondary | ICD-10-CM | POA: Diagnosis not present

## 2018-02-15 DIAGNOSIS — Z79899 Other long term (current) drug therapy: Secondary | ICD-10-CM | POA: Diagnosis not present

## 2018-02-15 DIAGNOSIS — I10 Essential (primary) hypertension: Secondary | ICD-10-CM | POA: Insufficient documentation

## 2018-02-15 DIAGNOSIS — Z87891 Personal history of nicotine dependence: Secondary | ICD-10-CM | POA: Insufficient documentation

## 2018-02-15 DIAGNOSIS — W19XXXA Unspecified fall, initial encounter: Secondary | ICD-10-CM

## 2018-02-15 DIAGNOSIS — E039 Hypothyroidism, unspecified: Secondary | ICD-10-CM | POA: Insufficient documentation

## 2018-02-15 DIAGNOSIS — N390 Urinary tract infection, site not specified: Secondary | ICD-10-CM

## 2018-02-15 DIAGNOSIS — R42 Dizziness and giddiness: Secondary | ICD-10-CM | POA: Diagnosis present

## 2018-02-15 DIAGNOSIS — R4182 Altered mental status, unspecified: Secondary | ICD-10-CM | POA: Diagnosis not present

## 2018-02-15 LAB — BASIC METABOLIC PANEL
Anion gap: 6 (ref 5–15)
BUN: 13 mg/dL (ref 8–23)
CALCIUM: 8.9 mg/dL (ref 8.9–10.3)
CHLORIDE: 109 mmol/L (ref 98–111)
CO2: 28 mmol/L (ref 22–32)
CREATININE: 0.49 mg/dL (ref 0.44–1.00)
GFR calc Af Amer: >60 mL/min (ref >60)
GFR calc non Af Amer: >60 mL/min (ref >60)
Glucose, Bld: 134 mg/dL — ABNORMAL HIGH (ref 70–99)
Potassium: 4.6 mmol/L (ref 3.5–5.1)
SODIUM: 143 mmol/L (ref 135–145)

## 2018-02-15 LAB — URINALYSIS, ROUTINE W REFLEX MICROSCOPIC
Bilirubin Urine: NEGATIVE
GLUCOSE, UA: NEGATIVE mg/dL
Ketones, ur: NEGATIVE mg/dL
Nitrite: NEGATIVE
PH: 6 (ref 5.0–8.0)
Protein, ur: 30 mg/dL — AB
SPECIFIC GRAVITY, URINE: 1.014 (ref 1.005–1.030)
SQUAMOUS EPITHELIAL / LPF: NONE SEEN (ref 0–5)

## 2018-02-15 LAB — CBC
HEMATOCRIT: 38.8 % (ref 35.0–47.0)
HEMOGLOBIN: 12.8 g/dL (ref 12.0–16.0)
MCH: 31.2 pg (ref 26.0–34.0)
MCHC: 33.1 g/dL (ref 32.0–36.0)
MCV: 94.3 fL (ref 80.0–100.0)
Platelets: 482 10*3/uL — ABNORMAL HIGH (ref 150–440)
RBC: 4.11 MIL/uL (ref 3.80–5.20)
RDW: 15.7 % — AB (ref 11.5–14.5)
WBC: 15 10*3/uL — ABNORMAL HIGH (ref 3.6–11.0)

## 2018-02-15 MED ORDER — CEPHALEXIN 500 MG PO CAPS: 500.0000 mg | ORAL_CAPSULE | Freq: Once | ORAL | Status: DC

## 2018-02-15 MED ORDER — NITROFURANTOIN MONOHYD MACRO 100 MG PO CAPS
100.0000 mg | ORAL_CAPSULE | Freq: Once | ORAL | Status: AC
  Administered 2018-02-15: 100 mg via ORAL
  Filled 2018-02-15: qty 1

## 2018-02-15 MED ORDER — NITROFURANTOIN MONOHYD MACRO 100 MG PO CAPS: 100.0000 mg | ORAL_CAPSULE | Freq: Two times a day (BID) | ORAL | 0 refills | Status: AC

## 2018-02-15 NOTE — ED Provider Notes (Signed)
[facility] Emergency Department Provider Note  Time seen: 4:49 PM  I have reviewed the triage vital signs and the nursing notes.   HISTORY  Chief Complaint Fall    HPI Renee Cowan is a 72 y.o. female with a past medical history of depression, fibromyalgia, hypertension, hyperlipidemia, confusion, presents to the emergency department after a fall.  According to EMS report patient comes from [facility] facility, had a fall earlier today around 1 PM.  Around 3 PM began complaining of dizziness.  Part report patient has a history of brain cancer, with the complaint of dizziness they decided to transport the patient to the emergency department for evaluation.  Upon arrival patient appears very well, calm, cooperative and pleasant.  She is confused, disoriented to time.  I reviewed the patient's records appears to get her cancer therapy at [facility].  Per notes family states she is confused and will confabulate at times.  Wheelchair-bound.   Past Medical History:  Diagnosis Date  . Allergy   . Cataract   . Concussion   . Depression   . Fibromyalgia   . Hyperlipidemia   . Hypertension   . Hypothyroidism   . Osteoporosis   . Rheumatoid arteritis   . Trigger finger   . Vitamin D deficiency     Patient Active Problem List   Diagnosis Date Noted  . Diplopia 06/26/2017  . 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 Imbalance 05/15/2017  . Ataxia   . Abnormal MRI of head   . GAD (generalized anxiety disorder) 04/25/2017  . Moderate recurrent major depression ([place]) 04/25/2017  . Type 2 diabetes mellitus with microalbuminuria, without long-term current use of insulin ([place]) 11/28/2015  . Benign essential HTN 05/26/2015  . Depression, major, in remission ([place]) 05/26/2015  . Dyslipidemia 05/26/2015  . Fibromyalgia syndrome 05/26/2015  . Personal history of malignant melanoma of skin 05/26/2015  . Adult hypothyroidism  05/26/2015  . Osteopenia after menopause 05/26/2015  . Tobacco use 05/26/2015  . Vitamin D deficiency 05/26/2015  . Rheumatoid arthritis ([place]) 05/26/2015  . Acquired trigger finger 10/11/2007    Past Surgical History:  Procedure Laterality Date  . CATARACT EXTRACTION, BILATERAL Bilateral [id number] and [id number]  . CHOLECYSTECTOMY    . CYST REMOVAL TRUNK    . melaoma excesion    . trigger thumb release      Prior to Admission medications   Medication Sig Start Date End Date Taking? Authorizing Provider  acetaminophen (TYLENOL) 325 MG tablet Take 2 tablets (650 mg total) by mouth every 6 (six) hours as needed for mild pain (or Fever >/= 101). Patient not taking: Reported on 07/18/2017 06/26/17   Steele Sizer, MD  blood glucose meter kit and supplies Dispense based on patient and insurance preference. Use up to four times daily as directed. (FOR ICD-10 E10.9, E11.9). 07/22/17   Steele Sizer, MD  Calcium Carb-Cholecalciferol (CALCIUM + D3) 600-200 MG-UNIT TABS Take by mouth.    [provider]  Cholecalciferol (VITAMIN D) 2000 units tablet Take 1 tablet (2,000 Units total) by mouth daily. 07/30/17   Steele Sizer, MD  Cyanocobalamin (B-12) 500 MCG TABS Take 1 tablet by mouth daily. 06/26/17   [provider]  cyanocobalamin 500 MCG tablet Take 1 tablet (500 mcg total) by mouth daily. 06/26/17   Steele Sizer, MD  cyclobenzaprine (FLEXERIL) 5 MG tablet Take 5 mg by mouth 3 (three) times daily as needed for muscle spasms.    [provider]  DULoxetine (CYMBALTA) 60 MG capsule Take 1 capsule (60 mg total) by mouth daily. 06/26/17   Steele Sizer, MD  folic acid (FOLVITE) 1 MG tablet Take 1 mg by mouth daily. 11/08/14   [provider]  levothyroxine (SYNTHROID, LEVOTHROID) 50 MCG tablet TAKE 1 TABLET BY MOUTH  DAILY BEFORE BREAKFAST 06/26/17   Ancil Boozer, Drue Stager, MD  lisinopril (PRINIVIL,ZESTRIL) 20 MG tablet Take 1 tablet (20 mg total) by mouth daily. 09/18/17  09/18/18  Steele Sizer, MD  Magnesium 250 MG TABS Take 1 tablet (250 mg total) by mouth daily. 06/26/17   Steele Sizer, MD  Magnesium Oxide 250 MG TABS Take 1 tablet by mouth daily. 07/12/17   [provider]  meloxicam (MOBIC) 15 MG tablet Take 15 mg by mouth as needed for pain.    [provider]  methotrexate (RHEUMATREX) 2.5 MG tablet Take 20 mg by mouth once a week. Caution:Chemotherapy. Protect from light.    [provider]  NIFEdipine (PROCARDIA XL/ADALAT-CC) 60 MG 24 hr tablet Take 1 tablet (60 mg total) by mouth daily. 09/18/17 09/18/18  Steele Sizer, MD  polyethylene glycol (MIRALAX / Floria Raveling) packet Take 17 g by mouth daily as needed.    [provider]  predniSONE (DELTASONE) 10 MG tablet Take 10 mg by mouth daily. 05/31/17   [provider]  predniSONE (DELTASONE) 5 MG tablet Take one tablet by mouth once daily for one week and every other week thereafter 07/01/17   [provider]    Allergies  Allergen Reactions  . Penicillins Rash  . Sulfa Antibiotics Rash    Family History  Problem Relation Age of Onset  . Cancer Mother        cervical  . Tuberculosis Father   . Kidney cancer Neg Hx   . Bladder Cancer Neg Hx     Social History Social History   Tobacco Use  . Smoking status: Former Smoker    Packs/day: 0.50    Years: 40.00    Pack years: 20.00    Types: Cigarettes    Start date: 12/03/1976    Last attempt to quit: 05/09/2017    Years since quitting: 0.7  . Smokeless tobacco: Never Used  Substance Use Topics  . Alcohol use: Yes    Alcohol/week: 0.0 standard drinks    Comment: occasionally  . Drug use: No    Review of Systems Unable to obtain an adequate/accurate review of systems secondary to baseline confusion.  ____________________________________________   PHYSICAL EXAM:  VITAL SIGNS: ED Triage Vitals  Enc Vitals Group     BP 02/15/18 1643 (!) 140/93     Pulse Rate 02/15/18 1643 83      Resp --      Temp 02/15/18 1643 98.4 F (36.9 C)     Temp Source 02/15/18 1643 Oral     SpO2 02/15/18 1634 95 %     Weight 02/15/18 1641 130 lb (59 kg)     Height 02/15/18 1641 _0  (1.473 m)     Head Circumference --      Peak Flow --      Pain Score 02/15/18 1641 0     Pain Loc --      Pain Edu? --      Excl. in [place]? --    Constitutional: Alert, disoriented to time. Well appearing and in no distress.  Calm and pleasant Eyes: Normal exam ENT   Head: Small abrasion to right front scalp   Mouth/Throat: Mucous  membranes are moist. Cardiovascular: Normal rate, regular rhythm. No murmur Respiratory: Normal respiratory effort without tachypnea nor retractions. Breath sounds are clear Gastrointestinal: Soft and nontender. No distention.   Musculoskeletal: Nontender with normal range of motion in all extremities.  Neurologic:  Normal speech and language. No gross focal neurologic deficits  Skin:  Skin is warm, dry and intact.  Psychiatric: Mood and affect are normal.   ____________________________________________   RADIOLOGY  CT scan of the head shows no acute abnormality.  ____________________________________________   INITIAL IMPRESSION / ASSESSMENT AND PLAN / ED COURSE  Pertinent labs & imaging results that were available during my care of the patient were reviewed by me and considered in my medical decision making (see chart for details).  She presents to the emergency department from a nursing facility after a fall.  Was complaining of dizziness.  Has no complaints at this time.  Denies any headache.  Patient does not recall the fall.  Per record review and EMS report patient has confusion at baseline.  Given the patient's history of CNS lymphoma we will obtain CT imaging of the head as a precaution as well as basic labs.  Patient agreeable to plan of care.  CT scan shows no acute abnormality.  Urinalysis is positive for urinary tract infection.  We will place the  patient on antibiotics.  Urine culture has been added onto the patient's urinalysis.  ____________________________________________   FINAL CLINICAL IMPRESSION(S) / ED DIAGNOSES  Fall Urinary tract infection   Harvest Dark, MD 02/15/18 2159

## 2018-02-15 NOTE — ED Notes (Signed)
Patient transported to CT 

## 2018-02-15 NOTE — ED Triage Notes (Signed)
Arrived  Via ems from [facility] today at 1 pm -  unwitnedded fall  C.o dizzy  3pm   No loss concouuness   History brian cancer according to staff pt is at her baseline  Pt is  Awake and alert she states she lost her balance and fell hitting  her head

## 2018-02-17 LAB — URINE CULTURE

## 2018-02-25 IMAGING — MR MR HEAD W/O CM
10 of 13 series · 29 of 48 positions shown · non-contrast
Comparison: MRI brain 10/28/2015

CLINICAL DATA: Ataxia.  Stroke suspected.  Subacute neuro deficit.

EXAM:
MRI HEAD WITHOUT CONTRAST
MRA HEAD WITHOUT CONTRAST
TECHNIQUE: Multiplanar, multiecho pulse sequences of the brain and surrounding
structures were obtained without intravenous contrast. Angiographic
images of the head were obtained using MRA technique without
contrast.

[Series 2: T1 · sagittal · 5.0mm · 0.45mm/px · 2 of 23 slices shown]
[im 1/23]
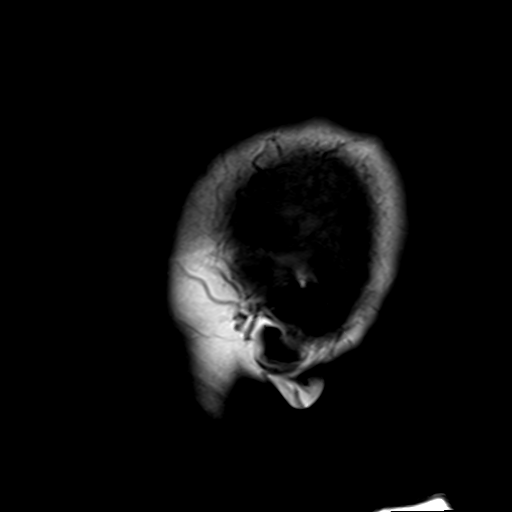
[im 12/23]
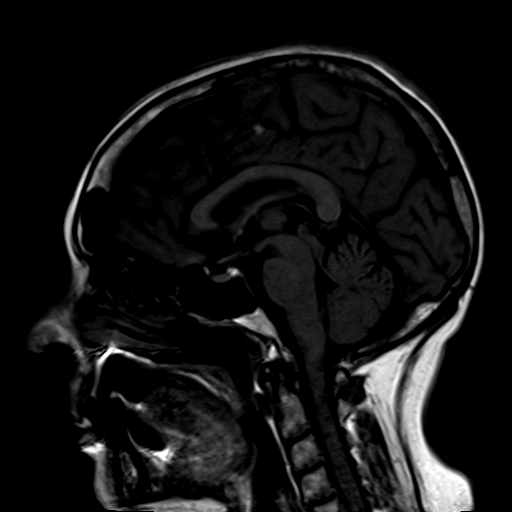

[Series 4: DWI · axial · 3.0mm · 1.80mm/px · z∈[-89,+71]mm · 5 of 55 slices shown (1 of 2)]
[im 1/55]
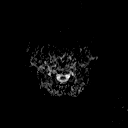
[im 14/55]
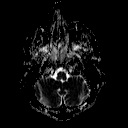
[im 28/55]
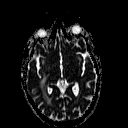
[im 41/55]
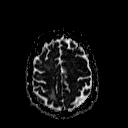
[im 55/55]
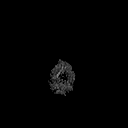

[Series 6: DWI · coronal · 3.0mm · 1.80mm/px · 4 of 45 slices shown (2 of 2)]
[im 1/45]
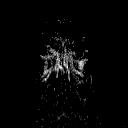
[im 15/45]
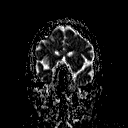
[im 30/45]
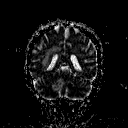
[im 45/45]
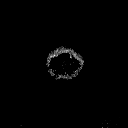

[Series 11: T2 · axial · 5.0mm · 0.60mm/px · z∈[-83,+71]mm · 2 of 25 slices shown (1 of 4)]
[im 1/25]
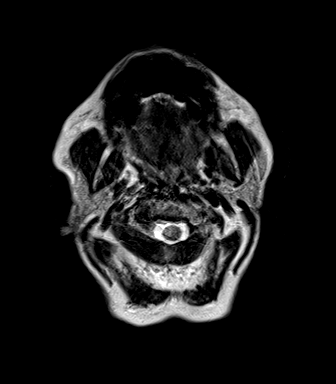
[im 25/25]
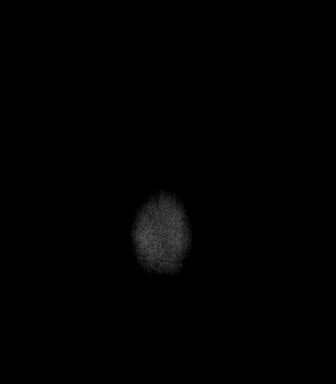

[Series 13: FLAIR · axial · 3.0mm · 0.45mm/px · z∈[-83,+71]mm · 4 of 53 slices shown (1 of 2)]
[im 1/53]
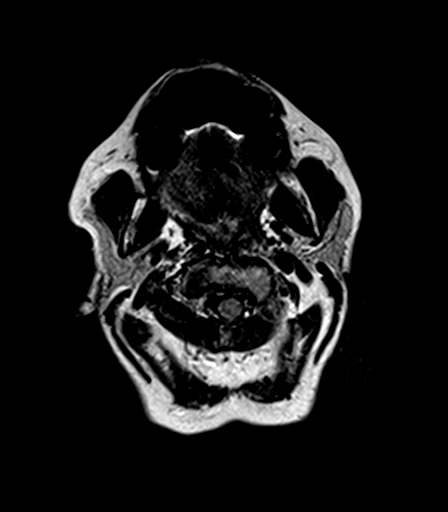
[im 18/53]
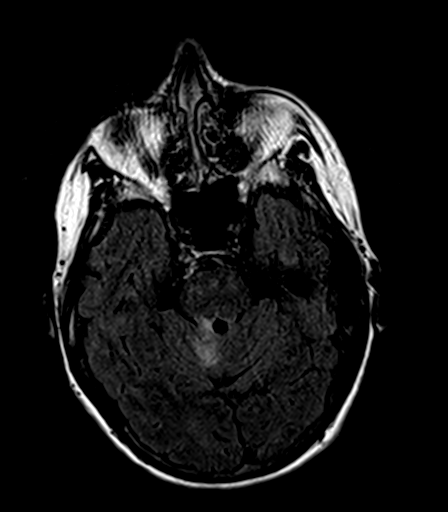
[im 35/53]
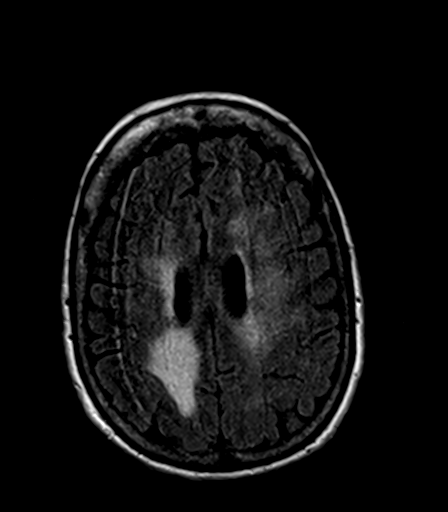
[im 53/53]
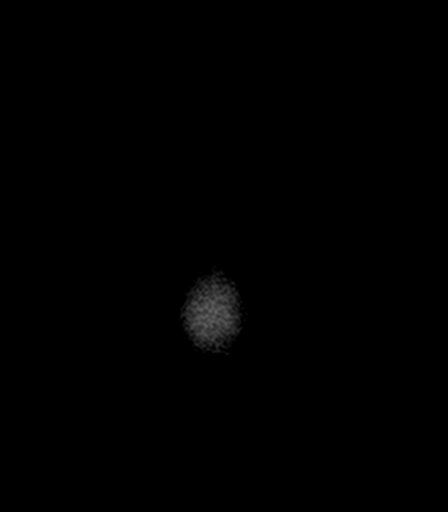

[Series 14: T2 · axial · 5.0mm · 0.45mm/px · z∈[-83,+71]mm · 2 of 25 slices shown (2 of 4)]
[im 1/25]
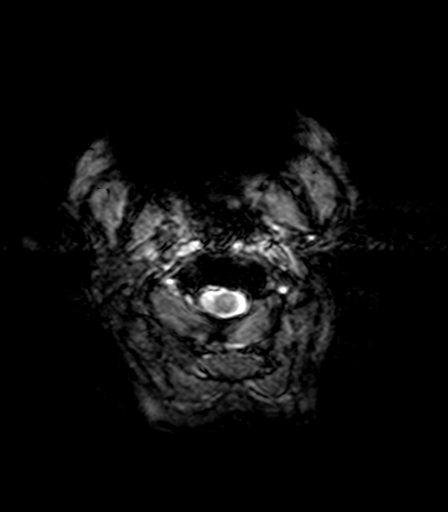
[im 25/25]
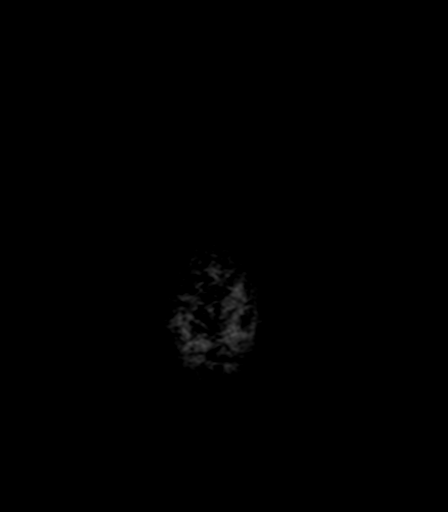

[Series 15: T2 · axial · 5.0mm · 0.60mm/px · z∈[-83,+71]mm · 2 of 25 slices shown (3 of 4)]
[im 1/25]
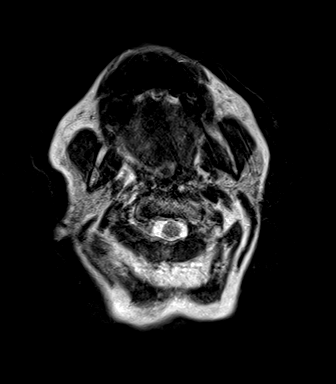
[im 25/25]
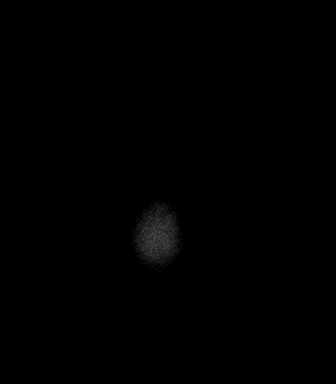

[Series 16: FLAIR · axial · 3.0mm · 0.45mm/px · z∈[-84,+71]mm · 4 of 53 slices shown (2 of 2)]
[im 1/53]
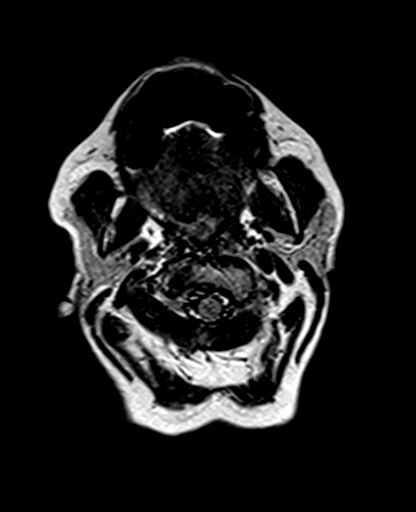
[im 18/53]
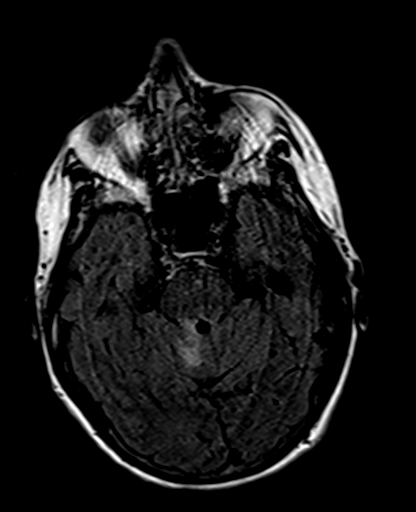
[im 35/53]
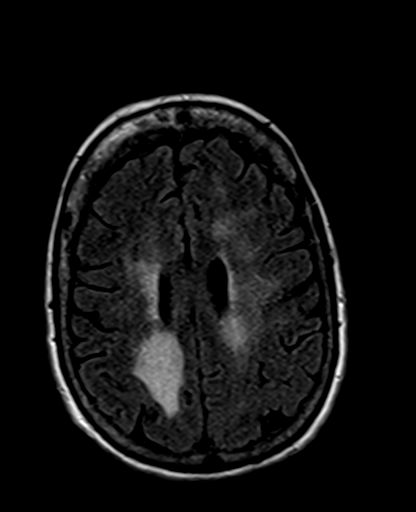
[im 53/53]
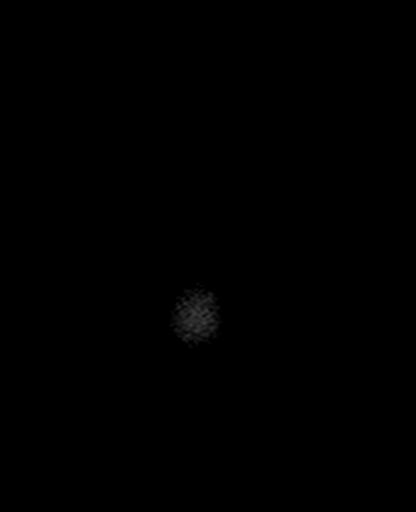

[Series 17: GRE · axial · 5.0mm · 0.45mm/px · z∈[-81,+73]mm · 2 of 25 slices shown]
[im 1/25]
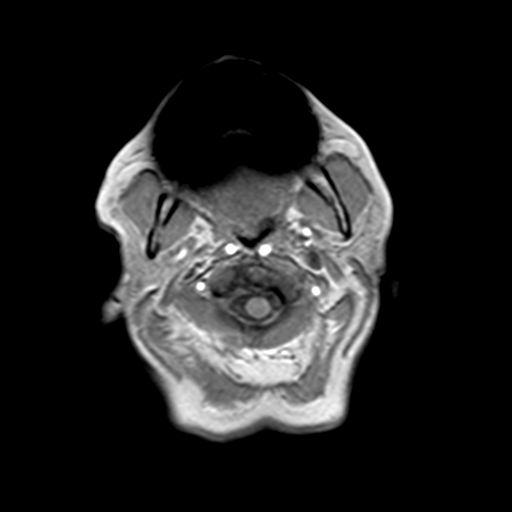
[im 25/25]
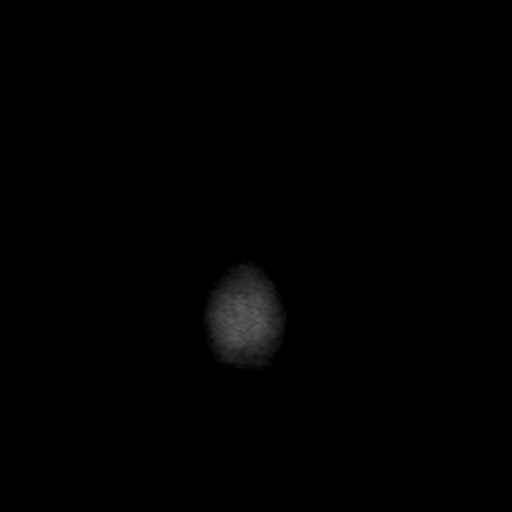

[Series 18: T2 · coronal · 5.0mm · 0.45mm/px · 2 of 27 slices shown (4 of 4)]
[im 1/27]
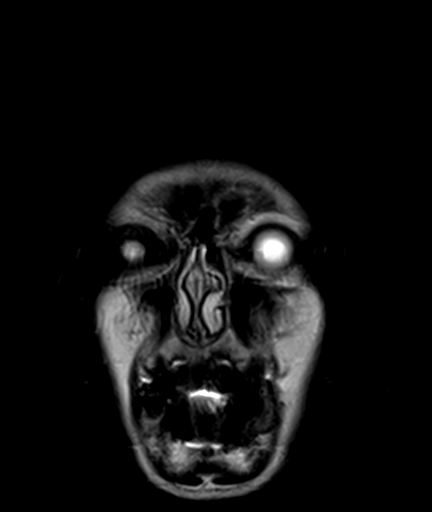
[im 27/27]
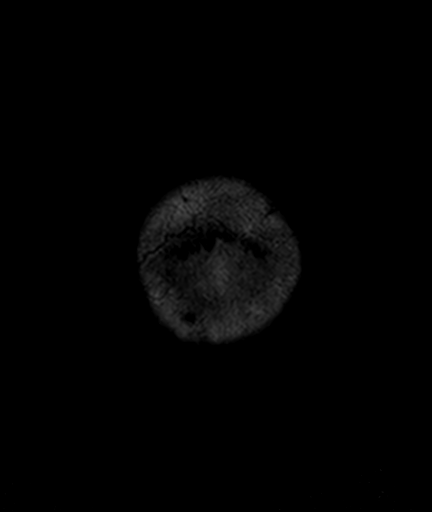

[29 of 48 positions shown; findings below may reference images not displayed]

FINDINGS: MRI HEAD FINDINGS

Brain: There is extensive progression of diffuse white matter
disease. Masslike T2 change is present about the atrium of the right
lateral ventricle. There is 5 mm rim of T2 hyperintense tissue
extending into the splenium of the corpus callosum with mild
restricted diffusion and surrounding T2 signal. Similar signal
changes of heterogeneous T2 signal and restricted diffusion are
present adjacent to the fourth ventricle involving the inferior
vermis and medial right cerebellum. There is also slight restricted
diffusion involving the posterior limb of the left internal capsule.

No acute hemorrhage or mass lesion is present. Atrophy has
progressed over the last 18 months. The ventricles are proportionate
to the degree of atrophy. No significant extra-axial fluid
collection is present.

The internal auditory canals are within normal limits. White matter
changes extend into the brainstem.

Vascular: Flow is present in the major intracranial arteries.

Skull and upper cervical spine: The skullbase is within normal
limits. The craniocervical junction is normal. Marrow signal is
normal. Midline sagittal structures are otherwise unremarkable.

Sinuses/Orbits: A polyp or mucous retention cyst is node along the
inferior aspect of the right maxillary sinus. The paranasal sinuses
and mastoid air cells are otherwise clear. The globes and orbits are
within normal limits.

MRA HEAD FINDINGS

MRA is degraded by patient motion. This limits evaluation of distal
vessels. Mild irregularity is present in the cavernous internal
carotid arteries without a significant stenosis to the ICA terminus.
A1 and M1 segments are normal bilaterally. MCA bifurcations are
intact. Attenuation of distal MCA and ACA branch vessels is likely
exaggerated by patient motion.

Pain right vertebral artery is dominant. Signal loss in the
vertebral arteries bilaterally is artifactual. Distal PICA vessels
are seen. The basilar artery is normal. There is asymmetric
attenuation of the proximal left P1 segment. Distal PCA branch
vessels are attenuated bilaterally.
IMPRESSION: 1. Focal subependymal T2 signal change about the atrium of the right
lateral ventricle with restricted diffusion. This raises concern for
lymphoma or focal subependymal infection. The latter is considered
less likely given the lack of disease elsewhere. An aggressive
demyelinating process is considered less likely. Vasculitis is also
considered. Question HIV or other immune mediated disease.
2. Similar findings are present along the right side of the fourth
ventricle involving the inferior vermis and medial right cerebellum.
3. T2 change and restricted diffusion in the posterior limb of the
left internal capsule. This may be related to the same process or
subacute infarct.
4. Significant progression of diffuse white matter disease since the
prior exam. This is likely related to small-vessel ischemia.
5. Narrowing of proximal left posterior cerebral artery.
6. No other significant proximal stenosis or branch vessel
occlusions.
7. Evaluation of distal vessels is degraded by significant patient
motion.

MRI the brain with contrast would be useful for further refined aunt
of the differential diagnosis. Lumbar puncture may be useful further
evaluation as well given the ventricular involvement.

These results were called by telephone at the time of interpretation
on 04/28/2017 at [DATE] to Dr. NAMHILA HANASEB , who verbally
acknowledged these results.

## 2018-05-01 ENCOUNTER — Other Ambulatory Visit
Admission: RE | Admit: 2018-05-01 | Discharge: 2018-05-01 | Disposition: A | Discharge status: Home / Self Care | Payer: Medicare Other | Source: Ambulatory Visit | Attending: Family Medicine | Admitting: Family Medicine

## 2018-05-01 DIAGNOSIS — E119 Type 2 diabetes mellitus without complications: Secondary | ICD-10-CM | POA: Insufficient documentation

## 2018-05-01 DIAGNOSIS — M797 Fibromyalgia: Secondary | ICD-10-CM | POA: Diagnosis present

## 2018-05-01 DIAGNOSIS — T80219D Unspecified infection due to central venous catheter, subsequent encounter: Secondary | ICD-10-CM | POA: Diagnosis present

## 2018-05-01 DIAGNOSIS — R293 Abnormal posture: Secondary | ICD-10-CM | POA: Diagnosis present

## 2018-05-01 LAB — COMPREHENSIVE METABOLIC PANEL
ALT: 12 U/L (ref 0–44)
AST: 14 U/L — ABNORMAL LOW (ref 15–41)
Albumin: 3.7 g/dL (ref 3.5–5.0)
Alkaline Phosphatase: 106 U/L (ref 38–126)
Anion gap: 7 (ref 5–15)
BUN: 13 mg/dL (ref 8–23)
CHLORIDE: 105 mmol/L (ref 98–111)
CO2: 28 mmol/L (ref 22–32)
Calcium: 9 mg/dL (ref 8.9–10.3)
Creatinine, Ser: 0.79 mg/dL (ref 0.44–1.00)
GFR calc Af Amer: >60 mL/min (ref >60)
Glucose, Bld: 145 mg/dL — ABNORMAL HIGH (ref 70–99)
POTASSIUM: 4.2 mmol/L (ref 3.5–5.1)
Sodium: 140 mmol/L (ref 135–145)
Total Bilirubin: 0.7 mg/dL (ref 0.3–1.2)
Total Protein: 6.1 g/dL — ABNORMAL LOW (ref 6.5–8.1)

## 2018-05-04 ENCOUNTER — Other Ambulatory Visit
Admission: RE | Admit: 2018-05-04 | Discharge: 2018-05-04 | Disposition: A | Discharge status: Home / Self Care | Payer: Medicare Other | Source: Ambulatory Visit | Attending: Family Medicine | Admitting: Family Medicine

## 2018-05-04 DIAGNOSIS — D72829 Elevated white blood cell count, unspecified: Secondary | ICD-10-CM | POA: Insufficient documentation

## 2018-05-04 LAB — URINALYSIS, COMPLETE (UACMP) WITH MICROSCOPIC
BILIRUBIN URINE: NEGATIVE
Glucose, UA: NEGATIVE mg/dL
KETONES UR: NEGATIVE mg/dL
Nitrite: NEGATIVE
PROTEIN: 30 mg/dL — AB
Specific Gravity, Urine: 1.01 (ref 1.005–1.030)
pH: 7 (ref 5.0–8.0)

## 2018-05-07 LAB — URINE CULTURE

## 2018-05-25 DEATH — deceased
